# Patient Record
Sex: Female | Born: 1975 | Race: White | Hispanic: No | Marital: Married | Smoking: Former smoker
Health system: Southern US, Community
[De-identification: ages and names within clinical notes are randomized; demographics above are authoritative.]

## PROBLEM LIST (undated history)

## (undated) DIAGNOSIS — J45909 Unspecified asthma, uncomplicated: Secondary | ICD-10-CM

## (undated) DIAGNOSIS — I82409 Acute embolism and thrombosis of unspecified deep veins of unspecified lower extremity: Secondary | ICD-10-CM

---

## 1998-10-06 ENCOUNTER — Ambulatory Visit (HOSPITAL_COMMUNITY): Admission: RE | Admit: 1998-10-06 | Discharge: 1998-10-06 | Payer: Self-pay | Admitting: *Deleted

## 2000-01-22 ENCOUNTER — Emergency Department (HOSPITAL_COMMUNITY): Admission: EM | Admit: 2000-01-22 | Discharge: 2000-01-22 | Payer: Self-pay | Admitting: Emergency Medicine

## 2000-01-24 ENCOUNTER — Emergency Department (HOSPITAL_COMMUNITY): Admission: EM | Admit: 2000-01-24 | Discharge: 2000-01-24 | Payer: Self-pay | Admitting: Emergency Medicine

## 2001-09-21 ENCOUNTER — Emergency Department (HOSPITAL_COMMUNITY): Admission: EM | Admit: 2001-09-21 | Discharge: 2001-09-21 | Payer: Self-pay | Admitting: Emergency Medicine

## 2002-04-19 ENCOUNTER — Encounter: Admission: RE | Admit: 2002-04-19 | Discharge: 2002-04-19 | Payer: Self-pay | Admitting: Family Medicine

## 2002-04-26 ENCOUNTER — Encounter: Admission: RE | Admit: 2002-04-26 | Discharge: 2002-04-26 | Payer: Self-pay | Admitting: Family Medicine

## 2002-05-02 ENCOUNTER — Ambulatory Visit (HOSPITAL_COMMUNITY): Admission: RE | Admit: 2002-05-02 | Discharge: 2002-05-02 | Payer: Self-pay | Admitting: Family Medicine

## 2002-05-05 ENCOUNTER — Inpatient Hospital Stay (HOSPITAL_COMMUNITY): Admission: AD | Admit: 2002-05-05 | Discharge: 2002-05-05 | Payer: Self-pay | Admitting: *Deleted

## 2002-05-05 ENCOUNTER — Encounter (INDEPENDENT_AMBULATORY_CARE_PROVIDER_SITE_OTHER): Payer: Self-pay | Admitting: *Deleted

## 2002-05-10 ENCOUNTER — Ambulatory Visit: Admission: RE | Admit: 2002-05-10 | Discharge: 2002-05-10 | Payer: Self-pay | Admitting: Family Medicine

## 2002-05-18 ENCOUNTER — Encounter: Admission: RE | Admit: 2002-05-18 | Discharge: 2002-05-18 | Payer: Self-pay | Admitting: Family Medicine

## 2002-05-28 ENCOUNTER — Encounter: Admission: RE | Admit: 2002-05-28 | Discharge: 2002-05-28 | Payer: Self-pay | Admitting: Family Medicine

## 2002-06-28 ENCOUNTER — Encounter: Admission: RE | Admit: 2002-06-28 | Discharge: 2002-06-28 | Payer: Self-pay | Admitting: Family Medicine

## 2002-07-12 ENCOUNTER — Ambulatory Visit (HOSPITAL_COMMUNITY): Admission: RE | Admit: 2002-07-12 | Discharge: 2002-07-12 | Payer: Self-pay | Admitting: Family Medicine

## 2002-08-03 ENCOUNTER — Encounter: Admission: RE | Admit: 2002-08-03 | Discharge: 2002-08-03 | Payer: Self-pay | Admitting: Family Medicine

## 2002-09-07 ENCOUNTER — Encounter: Admission: RE | Admit: 2002-09-07 | Discharge: 2002-09-07 | Payer: Self-pay | Admitting: Family Medicine

## 2002-09-28 ENCOUNTER — Encounter: Admission: RE | Admit: 2002-09-28 | Discharge: 2002-09-28 | Payer: Self-pay | Admitting: Family Medicine

## 2002-10-11 ENCOUNTER — Encounter: Admission: RE | Admit: 2002-10-11 | Discharge: 2002-10-11 | Payer: Self-pay | Admitting: *Deleted

## 2002-10-24 ENCOUNTER — Encounter: Admission: RE | Admit: 2002-10-24 | Discharge: 2002-10-24 | Payer: Self-pay | Admitting: Family Medicine

## 2002-11-09 ENCOUNTER — Encounter: Admission: RE | Admit: 2002-11-09 | Discharge: 2002-11-09 | Payer: Self-pay | Admitting: Family Medicine

## 2002-11-13 ENCOUNTER — Encounter: Admission: RE | Admit: 2002-11-13 | Discharge: 2002-11-13 | Payer: Self-pay | Admitting: Sports Medicine

## 2002-11-20 ENCOUNTER — Encounter: Admission: RE | Admit: 2002-11-20 | Discharge: 2002-11-20 | Payer: Self-pay | Admitting: Family Medicine

## 2002-11-27 ENCOUNTER — Inpatient Hospital Stay (HOSPITAL_COMMUNITY): Admission: AD | Admit: 2002-11-27 | Discharge: 2002-11-27 | Payer: Self-pay | Admitting: Obstetrics and Gynecology

## 2002-11-28 ENCOUNTER — Encounter: Admission: RE | Admit: 2002-11-28 | Discharge: 2002-11-28 | Payer: Self-pay | Admitting: Family Medicine

## 2002-12-04 ENCOUNTER — Encounter: Admission: RE | Admit: 2002-12-04 | Discharge: 2002-12-04 | Payer: Self-pay | Admitting: Family Medicine

## 2002-12-09 ENCOUNTER — Inpatient Hospital Stay (HOSPITAL_COMMUNITY): Admission: AD | Admit: 2002-12-09 | Discharge: 2002-12-11 | Payer: Self-pay | Admitting: *Deleted

## 2003-01-31 ENCOUNTER — Encounter: Admission: RE | Admit: 2003-01-31 | Discharge: 2003-01-31 | Payer: Self-pay | Admitting: Family Medicine

## 2003-03-06 ENCOUNTER — Encounter: Admission: RE | Admit: 2003-03-06 | Discharge: 2003-03-06 | Payer: Self-pay | Admitting: Family Medicine

## 2003-05-28 ENCOUNTER — Encounter: Admission: RE | Admit: 2003-05-28 | Discharge: 2003-05-28 | Payer: Self-pay | Admitting: Sports Medicine

## 2003-08-22 ENCOUNTER — Encounter: Admission: RE | Admit: 2003-08-22 | Discharge: 2003-08-22 | Payer: Self-pay | Admitting: Family Medicine

## 2003-10-03 ENCOUNTER — Emergency Department (HOSPITAL_COMMUNITY): Admission: EM | Admit: 2003-10-03 | Discharge: 2003-10-04 | Payer: Self-pay | Admitting: Emergency Medicine

## 2003-11-21 ENCOUNTER — Encounter: Admission: RE | Admit: 2003-11-21 | Discharge: 2003-11-21 | Payer: Self-pay | Admitting: Sports Medicine

## 2004-02-20 ENCOUNTER — Encounter: Admission: RE | Admit: 2004-02-20 | Discharge: 2004-02-20 | Payer: Self-pay | Admitting: Family Medicine

## 2004-05-21 ENCOUNTER — Ambulatory Visit: Payer: Self-pay | Admitting: Family Medicine

## 2004-08-13 ENCOUNTER — Ambulatory Visit: Payer: Self-pay | Admitting: Family Medicine

## 2004-11-12 ENCOUNTER — Ambulatory Visit: Payer: Self-pay | Admitting: Family Medicine

## 2005-02-09 ENCOUNTER — Ambulatory Visit: Payer: Self-pay | Admitting: Family Medicine

## 2005-02-18 ENCOUNTER — Ambulatory Visit: Payer: Self-pay | Admitting: Sports Medicine

## 2005-02-18 ENCOUNTER — Encounter: Admission: RE | Admit: 2005-02-18 | Discharge: 2005-02-18 | Payer: Self-pay | Admitting: Sports Medicine

## 2005-05-06 ENCOUNTER — Ambulatory Visit: Payer: Self-pay | Admitting: Family Medicine

## 2005-07-28 ENCOUNTER — Ambulatory Visit: Payer: Self-pay | Admitting: Family Medicine

## 2005-10-27 ENCOUNTER — Ambulatory Visit: Payer: Self-pay | Admitting: Family Medicine

## 2006-01-25 ENCOUNTER — Ambulatory Visit: Payer: Self-pay | Admitting: Family Medicine

## 2006-01-28 ENCOUNTER — Ambulatory Visit: Payer: Self-pay | Admitting: Family Medicine

## 2006-02-24 ENCOUNTER — Ambulatory Visit: Payer: Self-pay | Admitting: Family Medicine

## 2006-05-23 ENCOUNTER — Encounter (INDEPENDENT_AMBULATORY_CARE_PROVIDER_SITE_OTHER): Payer: Self-pay | Admitting: *Deleted

## 2006-06-06 ENCOUNTER — Ambulatory Visit: Payer: Self-pay | Admitting: Family Medicine

## 2006-06-22 ENCOUNTER — Ambulatory Visit: Payer: Self-pay | Admitting: Family Medicine

## 2006-07-06 ENCOUNTER — Emergency Department (HOSPITAL_COMMUNITY): Admission: EM | Admit: 2006-07-06 | Discharge: 2006-07-07 | Payer: Self-pay | Admitting: Emergency Medicine

## 2006-08-23 HISTORY — PX: CHOLECYSTECTOMY: SHX55

## 2006-08-29 ENCOUNTER — Emergency Department (HOSPITAL_COMMUNITY): Admission: EM | Admit: 2006-08-29 | Discharge: 2006-08-29 | Payer: Self-pay | Admitting: Emergency Medicine

## 2006-09-03 ENCOUNTER — Ambulatory Visit (HOSPITAL_COMMUNITY): Admission: EM | Admit: 2006-09-03 | Discharge: 2006-09-03 | Payer: Self-pay | Admitting: Emergency Medicine

## 2006-09-03 ENCOUNTER — Encounter (INDEPENDENT_AMBULATORY_CARE_PROVIDER_SITE_OTHER): Payer: Self-pay | Admitting: Specialist

## 2006-10-20 DIAGNOSIS — F17200 Nicotine dependence, unspecified, uncomplicated: Secondary | ICD-10-CM

## 2006-10-20 DIAGNOSIS — M79609 Pain in unspecified limb: Secondary | ICD-10-CM

## 2006-10-20 DIAGNOSIS — K449 Diaphragmatic hernia without obstruction or gangrene: Secondary | ICD-10-CM | POA: Insufficient documentation

## 2006-10-20 DIAGNOSIS — G44209 Tension-type headache, unspecified, not intractable: Secondary | ICD-10-CM

## 2006-10-20 DIAGNOSIS — Z87891 Personal history of nicotine dependence: Secondary | ICD-10-CM | POA: Insufficient documentation

## 2006-10-20 HISTORY — DX: Nicotine dependence, unspecified, uncomplicated: F17.200

## 2006-10-20 HISTORY — DX: Diaphragmatic hernia without obstruction or gangrene: K44.9

## 2006-10-21 ENCOUNTER — Encounter (INDEPENDENT_AMBULATORY_CARE_PROVIDER_SITE_OTHER): Payer: Self-pay | Admitting: *Deleted

## 2007-07-10 ENCOUNTER — Ambulatory Visit: Payer: Self-pay | Admitting: Family Medicine

## 2007-07-10 DIAGNOSIS — R0602 Shortness of breath: Secondary | ICD-10-CM | POA: Insufficient documentation

## 2007-07-11 ENCOUNTER — Telehealth (INDEPENDENT_AMBULATORY_CARE_PROVIDER_SITE_OTHER): Payer: Self-pay | Admitting: Family Medicine

## 2007-07-11 ENCOUNTER — Encounter: Admission: RE | Admit: 2007-07-11 | Discharge: 2007-07-11 | Payer: Self-pay | Admitting: Vascular Surgery

## 2007-09-22 ENCOUNTER — Telehealth: Payer: Self-pay | Admitting: *Deleted

## 2007-09-22 ENCOUNTER — Ambulatory Visit: Payer: Self-pay | Admitting: Family Medicine

## 2007-09-25 ENCOUNTER — Telehealth: Payer: Self-pay | Admitting: *Deleted

## 2008-01-11 ENCOUNTER — Telehealth (INDEPENDENT_AMBULATORY_CARE_PROVIDER_SITE_OTHER): Payer: Self-pay | Admitting: *Deleted

## 2008-01-12 ENCOUNTER — Encounter (INDEPENDENT_AMBULATORY_CARE_PROVIDER_SITE_OTHER): Payer: Self-pay | Admitting: Family Medicine

## 2008-01-12 ENCOUNTER — Ambulatory Visit: Payer: Self-pay | Admitting: Family Medicine

## 2008-01-12 DIAGNOSIS — R110 Nausea: Secondary | ICD-10-CM

## 2008-01-12 DIAGNOSIS — K648 Other hemorrhoids: Secondary | ICD-10-CM

## 2008-01-12 LAB — CONVERTED CEMR LAB
ALT: 14 units/L (ref 0–35)
AST: 12 units/L (ref 0–37)
Albumin: 4.3 g/dL (ref 3.5–5.2)
Alkaline Phosphatase: 80 units/L (ref 39–117)
BUN: 12 mg/dL (ref 6–23)
Beta hcg, urine, semiquantitative: NEGATIVE
CO2: 23 meq/L (ref 19–32)
Creatinine, Ser: 0.81 mg/dL (ref 0.40–1.20)
Glucose, Bld: 83 mg/dL (ref 70–99)
HDL: 51 mg/dL (ref >39)
Potassium: 4.4 meq/L (ref 3.5–5.3)
Total Bilirubin: 0.5 mg/dL (ref 0.3–1.2)
Total CHOL/HDL Ratio: 3.4
Total Protein: 6.9 g/dL (ref 6.0–8.3)

## 2008-01-16 ENCOUNTER — Encounter (INDEPENDENT_AMBULATORY_CARE_PROVIDER_SITE_OTHER): Payer: Self-pay | Admitting: Family Medicine

## 2008-06-05 ENCOUNTER — Telehealth (INDEPENDENT_AMBULATORY_CARE_PROVIDER_SITE_OTHER): Payer: Self-pay | Admitting: *Deleted

## 2008-06-06 ENCOUNTER — Ambulatory Visit: Payer: Self-pay | Admitting: Family Medicine

## 2008-06-06 DIAGNOSIS — R059 Cough, unspecified: Secondary | ICD-10-CM | POA: Insufficient documentation

## 2008-06-06 DIAGNOSIS — R05 Cough: Secondary | ICD-10-CM

## 2008-09-06 ENCOUNTER — Ambulatory Visit: Payer: Self-pay | Admitting: Family Medicine

## 2008-09-06 ENCOUNTER — Encounter: Payer: Self-pay | Admitting: Family Medicine

## 2008-09-06 ENCOUNTER — Ambulatory Visit (HOSPITAL_COMMUNITY): Admission: RE | Admit: 2008-09-06 | Discharge: 2008-09-06 | Payer: Self-pay | Admitting: Family Medicine

## 2008-09-06 DIAGNOSIS — R079 Chest pain, unspecified: Secondary | ICD-10-CM | POA: Insufficient documentation

## 2008-09-06 HISTORY — DX: Chest pain, unspecified: R07.9

## 2008-09-06 LAB — CONVERTED CEMR LAB

## 2008-09-11 ENCOUNTER — Encounter: Payer: Self-pay | Admitting: Family Medicine

## 2009-04-21 ENCOUNTER — Telehealth: Payer: Self-pay | Admitting: *Deleted

## 2009-04-27 ENCOUNTER — Emergency Department (HOSPITAL_COMMUNITY): Admission: EM | Admit: 2009-04-27 | Discharge: 2009-04-28 | Payer: Self-pay | Admitting: Emergency Medicine

## 2009-06-24 ENCOUNTER — Encounter: Payer: Self-pay | Admitting: Family Medicine

## 2009-06-24 ENCOUNTER — Ambulatory Visit: Payer: Self-pay | Admitting: Family Medicine

## 2009-06-24 ENCOUNTER — Ambulatory Visit (HOSPITAL_COMMUNITY): Admission: RE | Admit: 2009-06-24 | Discharge: 2009-06-24 | Payer: Self-pay | Admitting: Family Medicine

## 2009-06-24 ENCOUNTER — Encounter: Admission: RE | Admit: 2009-06-24 | Discharge: 2009-06-24 | Payer: Self-pay | Admitting: Family Medicine

## 2009-07-03 ENCOUNTER — Encounter: Payer: Self-pay | Admitting: Family Medicine

## 2009-10-17 ENCOUNTER — Encounter: Payer: Self-pay | Admitting: Sports Medicine

## 2009-10-17 ENCOUNTER — Ambulatory Visit: Payer: Self-pay | Admitting: Family Medicine

## 2009-10-17 DIAGNOSIS — H9190 Unspecified hearing loss, unspecified ear: Secondary | ICD-10-CM

## 2010-09-24 NOTE — Assessment & Plan Note (Signed)
Summary: cough & earache,tcb   Vital Signs:  Patient profile:   35 year old female Weight:      206.1 pounds Temp:     98.8 degrees F oral Pulse rate:   97 / minute Pulse rhythm:   regular BP sitting:   123 / 79  (right arm) Cuff size:   regular  Vitals Entered By: Loralee Pacas CMA (October 17, 2009 3:43 PM) Comments cough x 1 month,earache x 2 weeks right ear is worse constant headache    Primary Care Provider:  Myrtie Soman  MD   History of Present Illness: URI Symptoms Onset: 1 month Description: cough, subjective fevers, fatigue.  Symptoms Nasal discharge: yes Fever: subjective Sore throat: no Cough: yes Wheezing: no Ear pain: yes, right with discharge GI symptoms: no Sick contacts: yes at home  Red Flags  Stiff neck: no Dyspnea: no Rash: no Swallowing difficulty: no  Sinusitis Risk Factors Headache/face pain: yes Double sickening: no tooth pain: no  Allergy Risk Factors Sneezing: n Itchy scratchy throat: n Seasonal symptoms: n  Flu Risk Factors Headache: y muscle aches: n severe fatigue: n    Current Medications (verified): 1)  Ventolin Hfa 108 (90 Base) Mcg/act Aers (Albuterol Sulfate) .Marland Kitchen.. 1-2 Puffs Inhaled Every 4 Hours As Needed For Cough or Wheeze 2)  Augmentin 500-125 Mg Tabs (Amoxicillin-Pot Clavulanate) .... One Tab By Mouth Two Times A Day X 10 Days 3)  Delsym Night Time Multi-Sympt 5-6.25-10-325 Mg/7ml Liqd (Phenyleph-Doxylamine-Dm-Apap) .Marland Kitchen.. 10ml By Mouth Two Times A Day, May Cause Drowsiness, Best To Take At Night.  Allergies (verified): No Known Drug Allergies  Review of Systems       See HPI  Physical Exam  General:  Well-developed,well-nourished,in no acute distress; alert,appropriate and cooperative throughout examination Head:  Normocephalic and atraumatic without obvious abnormalities.  Eyes:  No corneal or conjunctival inflammation noted. EOMI. Perrla. Ears:  External ear exam shows no significant lesions or  deformities.  Otoscopic examination reveals R EAM with effusion behind TM, some erythema, pain.  L EAM normal. No mastoid tenderness. Nose:  Frontal sinuses tender. Mouth:  Oral mucosa and oropharynx without lesions or exudates.  Neck:  Shotty LAD in neck. Lungs:  Normal respiratory effort, chest expands symmetrically. Lungs are clear to auscultation, no crackles or wheezes. Heart:  Normal rate and regular rhythm. S1 and S2 normal without gallop, murmur, click, rub or other extra sounds. Abdomen:  Bowel sounds positive,abdomen soft and non-tender without masses, organomegaly or hernias noted. Additional Exam:  Weber lateralizes to left, AC>BC in both ears.  R ear with effusion.   Impression & Recommendations:  Problem # 1:  SINUSITIS (ICD-473.9) Assessment New Sinusitis, likely some otitis media with effusion present as well.  Tx with augmentin, delsym night for symptomatic relief.  Tuning fork testing suggestive of Sensorineural loss on right, same side as effusion, will refer to ENT.  Her updated medication list for this problem includes:    Augmentin 500-125 Mg Tabs (Amoxicillin-pot clavulanate) ..... One tab by mouth two times a day x 10 days    Delsym Night Time Multi-sympt 5-6.25-10-325 Mg/32ml Liqd (Phenyleph-doxylamine-dm-apap) .Marland KitchenMarland KitchenMarland KitchenMarland Kitchen 10ml by mouth two times a day, may cause drowsiness, best to take at night.  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  Orders: ENT Referral (ENT)  Complete Medication  List: 1)  Ventolin Hfa 108 (90 Base) Mcg/act Aers (Albuterol sulfate) .Marland Kitchen.. 1-2 puffs inhaled every 4 hours as needed for cough or wheeze 2)  Augmentin 500-125 Mg Tabs (Amoxicillin-pot clavulanate) .... One tab by mouth two times a day x 10 days 3)  Delsym Night Time Multi-sympt 5-6.25-10-325 Mg/40ml Liqd  (Phenyleph-doxylamine-dm-apap) .Marland Kitchen.. 10ml by mouth two times a day, may cause drowsiness, best to take at night.  Patient Instructions: 1)  Sinusitis, R ear effusion. 2)  Augmentin 3)  Delsym nighttime (delsym, phenylephrine) 4)  Come back if no better in 10 days. 5)  If hearing is no better in 12 weeks, come back! 6)  -Dr. Karie Schwalbe. Prescriptions: DELSYM NIGHT TIME MULTI-SYMPT 5-6.25-10-325 MG/15ML LIQD (PHENYLEPH-DOXYLAMINE-DM-APAP) 10mL by mouth two times a day, may cause drowsiness, best to take at night.  #1 bottle x 0   Entered and Authorized by:   Rodney Langton MD   Signed by:   Rodney Langton MD on 10/17/2009   Method used:   Electronically to        [facility] (retail)       [address]       [place], [place]  [address]       Ph: [phone] or [phone]       Fax: [phone]   RxID:   [id number] AUGMENTIN 500-125 MG TABS (AMOXICILLIN-POT CLAVULANATE) One tab by mouth two times a day x 10 days  #20 x 0   Entered and Authorized by:   Rodney Langton MD   Signed by:   Rodney Langton MD on 10/17/2009   Method used:   Electronically to        [facility] (retail)       [address]       [place], [place]  [address]       Ph: [phone] or [phone]       Fax: [phone]   RxID:   [id number]

## 2010-09-24 NOTE — Letter (Signed)
Summary: *Referral Letter ENT  [facility]  [address]   [place], [place] [address]   Phone: [phone]  Fax: [phone]    10/17/2009  Thank you in advance for agreeing to see my patient:  Monica Sandoval [address] [place], [place]  [address]  Phone: [phone]  Reason for Referral: 35 year old female with recent right otitis media with persistent effusion and hearing loss on right side.  Effusion with bubbles seen on otoscopy on right, left side normal.  Weber Test lateralizes to left, Rinne Test: air conduction is greater than bone conduction in both ears.  Please help in evaluation for sensorineural hearing loss in right ear.  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   Past Medical History: 1)  IUD placed 06/22/06, NSVD `95, 2004, Wheeze, NOS-->Assthma?   Thank you again for agreeing to see our patient; please contact us if you have any further questions or need additional information.  Sincerely,  Rodney Langton MD

## 2010-09-24 NOTE — Letter (Signed)
Summary: Handout Printed  Printed Handout:  - Sinusitis 

## 2010-11-27 LAB — URINALYSIS, ROUTINE W REFLEX MICROSCOPIC
Bilirubin Urine: NEGATIVE
Hgb urine dipstick: NEGATIVE
Ketones, ur: NEGATIVE mg/dL
Nitrite: NEGATIVE
Protein, ur: NEGATIVE mg/dL
Specific Gravity, Urine: 1.024 (ref 1.005–1.030)
Urobilinogen, UA: 1 mg/dL (ref 0.0–1.0)

## 2010-11-27 LAB — COMPREHENSIVE METABOLIC PANEL
BUN: 9 mg/dL (ref 6–23)
CO2: 20 mEq/L (ref 19–32)
Calcium: 8.7 mg/dL (ref 8.4–10.5)
Chloride: 110 mEq/L (ref 96–112)
Creatinine, Ser: 0.78 mg/dL (ref 0.4–1.2)
GFR calc Af Amer: >60 mL/min (ref >60)
GFR calc non Af Amer: >60 mL/min (ref >60)
Glucose, Bld: 104 mg/dL — ABNORMAL HIGH (ref 70–99)
Sodium: 138 mEq/L (ref 135–145)
Total Bilirubin: 0.3 mg/dL (ref 0.3–1.2)

## 2010-11-27 LAB — DIFFERENTIAL
Basophils Absolute: 0 10*3/uL (ref 0.0–0.1)
Eosinophils Absolute: 0.5 10*3/uL (ref 0.0–0.7)
Eosinophils Relative: 5 % (ref 0–5)
Lymphocytes Relative: 27 % (ref 12–46)
Lymphs Abs: 2.7 10*3/uL (ref 0.7–4.0)

## 2010-11-27 LAB — CBC: MCHC: 34.5 g/dL (ref 30.0–36.0)

## 2010-11-27 LAB — POCT CARDIAC MARKERS
CKMB, poc: <1 ng/mL — ABNORMAL LOW (ref 1.0–8.0)
Myoglobin, poc: 48 ng/mL (ref 12–200)

## 2010-11-27 LAB — LIPASE, BLOOD: Lipase: 38 U/L (ref 11–59)

## 2010-11-27 LAB — D-DIMER, QUANTITATIVE: D-Dimer, Quant: 0.35 ug/mL-FEU (ref 0.00–0.48)

## 2011-01-08 NOTE — Op Note (Signed)
NAMEPHYILLIS, Monica Sandoval NO.:  [id number]   MEDICAL RECORD NO.:  [id number]          PATIENT TYPE:  OBV   LOCATION:  0098                         FACILITY:  [facility]   PHYSICIAN:  Anselm Pancoast. Weatherly, M.D.DATE OF BIRTH:  24-Dec-1975   DATE OF PROCEDURE:  09/03/2006  DATE OF DISCHARGE:  09/03/2006                               OPERATIVE REPORT   PREOPERATIVE DIAGNOSIS:  Chronic cholecystitis with stones, possible  common duct stone.   POSTOPERATIVE DIAGNOSIS:  Chronic cholecystitis with stones.   OPERATION:  Laparoscopic cholecystectomy with cholangiogram.   ANESTHESIA:  General anesthesia   SURGEON:  Anselm Pancoast. Zachery Dakins, M.D.   ASSISTANT:  Angelia Mould. Derrell Lolling, M.D.   HISTORY:  Daphne Karrer is a 35 year old Caucasian female, mother of 2.  They were referred to our office by the family practice with the  following history:  She has had several episodes of epigastric pain,  right upper quadrant radiating to the back; first started in November,  and then several attacks over the Christmas holidays.  She was seen and  ordered an ultrasound, read by Dr. Pecolia Ades, that showed stones, but no  evidence of acute cholecystitis and not a dilated common bile duct.  However, over the last week or two she has had repeat episodes; and then  it will go to her chest and to the back.  I saw her in the office on  Wednesday night with these symptoms.  I suggested that we kind of add  her to the urgent schedule, since it appeared to be that she is probably  passing common duct stones; and she has had some bumps in her liver  tests intermittently.  I was able to get her on the OR schedule for  Saturday morning, 2 days after I saw her in the office.  Last night she  had another episode of pain; and came to the emergency room at  approximately midnight; was given pain medicine, and spent the night in  the emergency room; since she was supposed to be here for surgery about  6:00 a.m.Marland Kitchen   She said her pain had resolved preoperatively and liver  function studies repeated last night were mildly elevated on CPT, but  normal bilirubin.   The patient preop was given 3 grams of Unasyn.  She has PAS stockings;  and was taken to the operative suite.  After induction of general  anesthesia, endotracheal tube/oral tube into the stomach, the abdomen  was prepped with Betadine solution, and draped sterilely.  A small  incision was made below the umbilicus.  The fascia was identified.  She  is moderately overweight, picked up between two Kochers; and then a  little opening carefully made through the fascia of the underlying  peritoneum was picked up; and then a little opening made through this.  The Hassan cannula was introduced and the gallbladder was not acutely  distended; and there were a few adhesions up around it,  but not that of  an obvious acute problem.  The upper 10-mm trocar was placed in the  subxiphoid area; and the two lateral 5-mm  trocars were placed in the  appropriate position by Dr. Derrell Lolling.   The gallbladder was retracted upward and outward; we could see what  appeared to be the junction of the cystic duct of the gallbladder within  the fatty tissue; and this was kind of carefully opened.  The cystic  artery was separated from the neck of the gallbladder and doubly clipped  proximally, and singly distally just saw the little lymph node was here  and identified.  Then we could encompass the cystic duct.  You could see  a stone in the cystic duct that was just out of the gallbladder, and I  tried to push it back up into the gallbladder and placed a clip; but  when we did, the stone was visible and I must have kind of pinched the  mucosa of the gallbladder and then firing the clip made the stone sort  of tear the wall of the gallbladder.  We removed that, made a little  incision just proximally in the cystic duct; and could cannulate it with  the hook catheter; and  then an x-ray was obtained.  She has a junction  of the cystic duct to really the right hepatic duct, but there is about  1.5 cm length, and then good flow through the common bile duct; the left  and right intrahepatic branches visualize, and then good flow into the  duodenum.  We removed the catheter and then triply clipped this little  short cystic duct, making sure that we were not impinging anything as  far as the right hepatic duct, etcetera.  We then kind of carefully  dissected the peritoneum; and, I think, there was a little posterior  branch of the cystic artery that was clipped; and then the gallbladder  freed from its bed with hook electrocautery.  We placed the gallbladder  in the EndoCatch bag; and then reinspected the gallbladder fossa where  the clips had been placed; and there was no bile and no bleeding; and we  were satisfied.   We then switched the camera to the upper 10-mm port and withdrew the bag  containing the gallbladder.  On reinspection, there was no evidence any  bleeding or bile or remaining irrigating fluid; and then the 5 mm port  was withdrawn.  I closed the fascia at the umbilicus with the  pursestring that we had originally placed; and then a couple of figure-  of-eight sutures of #0 Vicryl with a U-5 needle; and then released the  carbon dioxide from the upper 10-mm port.  We had inspected the  umbilical repair with the camera prior to doing this; and then also put  a figure-of-eight in the anterior fascia at the subxiphoid area with the  U-6 needle.  The subcutaneous wounds were closed with 4-0 Vicryl,  Benzoin, and Steri-Strips;  and then she was extubated and sent to the  recovery room in stable postop condition.  The patient tolerated the  procedure nicely.  Hopefully she will be able to go home today if she is  not nauseated.           ______________________________  Anselm Pancoast. Zachery Dakins, M.D.    WJW/MEDQ  D:  09/03/2006  T:  09/03/2006   Job:  [id number]

## 2011-07-02 ENCOUNTER — Other Ambulatory Visit: Payer: Self-pay

## 2011-07-02 ENCOUNTER — Encounter: Payer: Self-pay | Admitting: *Deleted

## 2011-07-02 ENCOUNTER — Emergency Department (HOSPITAL_COMMUNITY)
Admission: EM | Admit: 2011-07-02 | Discharge: 2011-07-02 | Disposition: A | Discharge status: Home / Self Care | Payer: Self-pay | Attending: Emergency Medicine | Admitting: Emergency Medicine

## 2011-07-02 ENCOUNTER — Emergency Department (HOSPITAL_COMMUNITY): Payer: Self-pay

## 2011-07-02 DIAGNOSIS — R079 Chest pain, unspecified: Secondary | ICD-10-CM | POA: Insufficient documentation

## 2011-07-02 DIAGNOSIS — R05 Cough: Secondary | ICD-10-CM | POA: Insufficient documentation

## 2011-07-02 DIAGNOSIS — R209 Unspecified disturbances of skin sensation: Secondary | ICD-10-CM | POA: Insufficient documentation

## 2011-07-02 DIAGNOSIS — R609 Edema, unspecified: Secondary | ICD-10-CM | POA: Insufficient documentation

## 2011-07-02 DIAGNOSIS — R110 Nausea: Secondary | ICD-10-CM | POA: Insufficient documentation

## 2011-07-02 DIAGNOSIS — J40 Bronchitis, not specified as acute or chronic: Secondary | ICD-10-CM | POA: Insufficient documentation

## 2011-07-02 DIAGNOSIS — J209 Acute bronchitis, unspecified: Secondary | ICD-10-CM

## 2011-07-02 DIAGNOSIS — R51 Headache: Secondary | ICD-10-CM | POA: Insufficient documentation

## 2011-07-02 DIAGNOSIS — R059 Cough, unspecified: Secondary | ICD-10-CM | POA: Insufficient documentation

## 2011-07-02 LAB — CBC
MCH: 30.5 pg (ref 26.0–34.0)
MCHC: 34.8 g/dL (ref 30.0–36.0)
RDW: 13.8 % (ref 11.5–15.5)

## 2011-07-02 LAB — BASIC METABOLIC PANEL
Calcium: 9.5 mg/dL (ref 8.4–10.5)
GFR calc Af Amer: >90 mL/min (ref >90)
GFR calc non Af Amer: >90 mL/min (ref >90)
Potassium: 4 mEq/L (ref 3.5–5.1)
Sodium: 140 mEq/L (ref 135–145)

## 2011-07-02 LAB — POCT I-STAT TROPONIN I: Troponin i, poc: 0 ng/mL (ref 0.00–0.08)

## 2011-07-02 MED ORDER — PREDNISONE 20 MG PO TABS
60.0000 mg | ORAL_TABLET | Freq: Once | ORAL | Status: AC
  Administered 2011-07-02: 60 mg via ORAL
  Filled 2011-07-02: qty 3

## 2011-07-02 MED ORDER — IPRATROPIUM BROMIDE 0.02 % IN SOLN
0.5000 mg | Freq: Once | RESPIRATORY_TRACT | Status: AC
  Administered 2011-07-02: 0.5 mg via RESPIRATORY_TRACT
  Filled 2011-07-02: qty 2.5

## 2011-07-02 MED ORDER — PREDNISONE 20 MG PO TABS: 60.0000 mg | ORAL_TABLET | Freq: Every day | ORAL | Status: AC

## 2011-07-02 MED ORDER — ALBUTEROL SULFATE HFA 108 (90 BASE) MCG/ACT IN AERS
2.0000 | INHALATION_SPRAY | RESPIRATORY_TRACT | Status: DC | PRN
  Administered 2011-07-02: 2 via RESPIRATORY_TRACT
  Filled 2011-07-02: qty 6.7

## 2011-07-02 MED ORDER — ASPIRIN 81 MG PO CHEW
324.0000 mg | CHEWABLE_TABLET | Freq: Once | ORAL | Status: AC
  Administered 2011-07-02: 324 mg via ORAL
  Filled 2011-07-02: qty 4

## 2011-07-02 MED ORDER — ALBUTEROL SULFATE (5 MG/ML) 0.5% IN NEBU
2.5000 mg | INHALATION_SOLUTION | Freq: Once | RESPIRATORY_TRACT | Status: AC
  Administered 2011-07-02: 2.5 mg via RESPIRATORY_TRACT
  Filled 2011-07-02: qty 1

## 2011-07-02 MED ORDER — ALBUTEROL SULFATE HFA 108 (90 BASE) MCG/ACT IN AERS: 2.0000 | INHALATION_SPRAY | RESPIRATORY_TRACT | Status: DC | PRN

## 2011-07-02 NOTE — ED Notes (Signed)
Pt resting at this time.  HHN given, saline lock started.  Husband at bedside.  Skin warm and dry color appropriate.

## 2011-07-02 NOTE — ED Notes (Signed)
Pt reports left sided chest pain x several days. Reports last night pain started radiating into left arm with tingling. Reports nausea while driving to ED. Pt tearful during exam. Reports it feels like left arm is asleep.

## 2011-07-02 NOTE — ED Provider Notes (Signed)
History     CSN: [id number] Arrival date & time: 07/02/2011 11:01 AM   First MD Initiated Contact with Patient 07/02/11 1503      Chief Complaint  Patient presents with  . Chest Pain    (Consider location/radiation/quality/duration/timing/severity/associated sxs/prior treatment) The history is provided by the patient.   35 year old female comes in with a one-week history of chest pain. Pain is described as a dole sensation which becomes sharp when she takes a deep breath. Yesterday she started having some numbness in her left arm. She's had a headache during this past week as well. There's been some mild nausea, but no dyspnea and no diaphoresis. She has had a chronic cough for approximately 7 years which has gotten worse recently. She states that her pain as moderate with current pain being 5/10 and worst pain being 7/10. Nothing seems to make the pain better nothing seems to make it worse. It does not seem to be affected by taking deep breaths lying down sitting up or exertion. She is taking ibuprofen for the pain with no relief. She has a history of a hiatus hernia which is what she thought the pain was due to him. She got worried when she started having the numbness in her left arm. Cardiac risk factors are significant for tobacco abuse. She smokes half pack of cigarettes a day. There is a history of premature coronary disease in her family in that her father died at age 8 of an MI. She denies history of hypertension diabetes or high cholesterol.  History reviewed. No pertinent past medical history.  History reviewed. No pertinent past surgical history.  History reviewed. No pertinent family history.  History  Substance Use Topics  . Smoking status: Current Everyday Smoker    Types: Cigarettes  . Smokeless tobacco: Not on file  . Alcohol Use: No    OB History    Grav Para Term Preterm Abortions TAB SAB Ect Mult Living                  Review of Systems  All other systems  reviewed and are negative.    Allergies  Review of patient's allergies indicates no known allergies.  Home Medications   Current Outpatient Rx  Name Route Sig Dispense Refill  . IBUPROFEN 200 MG PO TABS Oral Take 800 mg by mouth every 8 (eight) hours as needed.       BP 122/68  Pulse 83  Temp(Src) 97.8 F (36.6 C) (Oral)  Resp 20  SpO2 98%  Physical Exam  Nursing note and vitals reviewed.  vital signs are normal. 35 year old female resting comfortably and in no acute distress. Head is normocephalic and atraumatic. HEENT: Pupils equal neck no jugular movements are full. Oropharynx is clear. TMs are clear. No sinus tenderness and no evidence of sinus drainage. Neck is supple without adenopathy or JVD. Lungs have diffuse mild wheezing and rhonchi but no rales present. Heart regular rate and rhythm without murmur or gallop. Chest: There is moderate bilateral parasternal tenderness which does not reproduce her pain. Abdomen is soft flat nontender without masses or hepatosplenomegaly peristalsis is normoactive. Extremities there is 1+ edema, but no cyanosis and no clubbing. Full range of motion is present in all joints. Neurologic:. She is awake alert oriented x3.  Cranial nerves are intact. There no focal motor Center deficits. Deep tendon reflexes are symmetric. Psychiatric: No abnormalities of mood or affect. ED Course  Procedures (including critical care time) She was given an  albuterol nebulizer treatment with significant improvement in chest discomfort with breathing. Reexamination showed lungs had only a few residual rhonchi. All wheezing was now gone.  Labs Reviewed  CBC  BASIC METABOLIC PANEL  POCT CARDIAC MARKERS   Results for orders placed during the hospital encounter of 07/02/11  CBC      Component Value Range   WBC 8.8  4.0 - 10.5 (K/uL)   RBC 4.85  3.87 - 5.11 (MIL/uL)   Hemoglobin 14.8  12.0 - 15.0 (g/dL)   HCT 11.9  14.7 - 82.9 (%)   MCV 87.6  78.0 - 100.0 (fL)    MCH 30.5  26.0 - 34.0 (pg)   MCHC 34.8  30.0 - 36.0 (g/dL)   RDW 56.2  13.0 - 86.5 (%)   Platelets 209  150 - 400 (K/uL)  BASIC METABOLIC PANEL      Component Value Range   Sodium 140  135 - 145 (mEq/L)   Potassium 4.0  3.5 - 5.1 (mEq/L)   Chloride 106  96 - 112 (mEq/L)   CO2 26  19 - 32 (mEq/L)   Glucose, Bld 83  70 - 99 (mg/dL)   BUN 10  6 - 23 (mg/dL)   Creatinine, Ser 7.84  0.50 - 1.10 (mg/dL)   Calcium 9.5  8.4 - 69.6 (mg/dL)   GFR calc non Af Amer >90  >90 (mL/min)   GFR calc Af Amer >90  >90 (mL/min)  D-DIMER, QUANTITATIVE      Component Value Range   D-Dimer, Quant 0.28  0.00 - 0.48 (ug/mL-FEU)  POCT I-STAT TROPONIN I      Component Value Range   Troponin i, poc 0.00  0.00 - 0.08 (ng/mL)   Comment 3            Dg Chest 2 View  07/02/2011  *RADIOLOGY REPORT*  Clinical Data: Chest pain  CHEST - 2 VIEW  Comparison: 06/24/2009  Findings: Cardiomediastinal silhouette is stable.  No acute infiltrate or pleural effusion.  No pulmonary edema.  Mild degenerative changes thoracic spine.  IMPRESSION: No active disease.  No significant change.  Original Report Authenticated By: Natasha Mead, M.D.    Images viewed by me. No results found. ECG shows normal sinus rhythm with a rate of 99, no ectopy. Normal axis. Normal P wave. Normal QRS. Normal intervals. Normal ST and T waves. Impression: normal ECG. Compared with ECG of 06/24/2009, no significant changes were found   No diagnosis found.    MDM  Chest pain which does not seem to be typical for coronary disease. Specific etiology is not clear at this point. Workup is initiated to rule out serious causes of chest pain including pneumonia, acute coronary syndrome, pulmonary embolism.        Dione Booze, MD 07/02/11 [phone]

## 2011-11-12 ENCOUNTER — Ambulatory Visit (INDEPENDENT_AMBULATORY_CARE_PROVIDER_SITE_OTHER): Payer: BC Managed Care – PPO | Admitting: Emergency Medicine

## 2011-11-12 ENCOUNTER — Encounter: Payer: Self-pay | Admitting: Emergency Medicine

## 2011-11-12 VITALS — BP 109/78 | HR 84 | Temp 98.0°F | Ht 64.25 in | Wt 219.3 lb

## 2011-11-12 DIAGNOSIS — Z0000 Encounter for general adult medical examination without abnormal findings: Secondary | ICD-10-CM

## 2011-11-12 DIAGNOSIS — M7989 Other specified soft tissue disorders: Secondary | ICD-10-CM

## 2011-11-12 DIAGNOSIS — L819 Disorder of pigmentation, unspecified: Secondary | ICD-10-CM

## 2011-11-12 DIAGNOSIS — R238 Other skin changes: Secondary | ICD-10-CM

## 2011-11-12 MED ORDER — ALBUTEROL SULFATE HFA 108 (90 BASE) MCG/ACT IN AERS: 2.0000 | INHALATION_SPRAY | RESPIRATORY_TRACT | Status: DC | PRN

## 2011-11-12 NOTE — Assessment & Plan Note (Signed)
DDx includes a viral arthropathy, degenerative arthritis, or RA.  Does not have the clinical signs of RA.  Improving.  Will monitor.  Continue motrin for pain. Will check ESR.

## 2011-11-12 NOTE — Assessment & Plan Note (Signed)
May be a result of edema in hands.  However, with family history or CAD, will obtain ABIs.

## 2011-11-12 NOTE — Patient Instructions (Signed)
It was nice to meet you!  We drew some blood today.  I will call with if anything is wrong, otherwise I will send you a letter with the results.  Please make an appointment for ABIs with Dr. Raymondo Band.  Please also make an appointment to replace your IUD.  I will see you back in 1-2 months to discuss your other concerns.

## 2011-11-12 NOTE — Assessment & Plan Note (Addendum)
Will need pap.  To be seen in women's health clinic for IUD removal and placement.  Will also check lipids and cmet for cholesterol and sugar/renal/liver.

## 2011-11-12 NOTE — Progress Notes (Addendum)
  Subjective:    Patient ID: Monica Sandoval, female    DOB: [birth year], 36 y.o.   MRN: [id number]  HPI GILLIAN MEEUWSEN is here to establish and care and address hand swelling.  1. Establish care: I have reviewed and updated the following as appropriate: allergies, current medications, past family history, past medical history, past social history, past surgical history and problem list.  She is due for a pap smear.  Currently has IUD that was place 5 years ago - wants it to be replaced.  2. Hand swelling: This started 2 months ago and has started to improve in the last week.  No trigger identified.  Worse in right middle PIP joint and left index PIP, some intermittent pain in these joints as well.  In general R is worse than L.  Associated with hands intermittently turning purple.  Works at [employer], but activities have not changed recently.  Was treated for an acute bronchitis in November.  Mom with RA.  No morning stiffness or claudication type symptoms.  No decreased sensation or strength.  FHx: Mom with RA, dad with CAD SHx: current smoker  Review of Systems See HPI    Objective:   Physical Exam BP 109/78  Pulse 84  Temp(Src) 98 F (36.7 C) (Oral)  Ht 5' 4.25" (1.632 m)  Wt 219 lb 4.8 oz (99.474 kg)  BMI 37.35 kg/m2 Gen: alert, NAD, cooperative HEENT: AT/[place], sclera white, PERRL, normal nasal mucosa, no pharyngeal erythema or exudate, TMs normal bilaterally, R ear canal has yellow deposit at base of TM Neck: supple, no LAD CV: RRR, no murmurs Pulm: CTAB, no wheezes or rales Abd: soft, NTND Hands: swelling of right middle PIP and left index PIP, no erythema, hands appear slightly purplish (R>L), 1+ radial pulses bilaterally, full range of motion, mild TTP of right middle PIP and left index PIP joints.  Negative tinel's, phalen's caused right hand numbness Ext: no pedal edema, right foot appears slightly purplish Neuro: gait normal Psych: good judgement and  insight     Assessment & Plan:

## 2011-11-13 LAB — COMPREHENSIVE METABOLIC PANEL
ALT: 14 U/L (ref 0–35)
AST: 13 U/L (ref 0–37)
CO2: 25 mEq/L (ref 19–32)
Chloride: 106 mEq/L (ref 96–112)
Creat: 0.89 mg/dL (ref 0.50–1.10)
Sodium: 139 mEq/L (ref 135–145)
Total Bilirubin: 0.3 mg/dL (ref 0.3–1.2)
Total Protein: 6.7 g/dL (ref 6.0–8.3)

## 2011-11-13 LAB — LIPID PANEL
HDL: 53 mg/dL (ref >39)
LDL Cholesterol: 107 mg/dL — ABNORMAL HIGH (ref 0–99)
Total CHOL/HDL Ratio: 3.3 Ratio

## 2011-11-13 LAB — SEDIMENTATION RATE: Sed Rate: 4 mm/hr (ref 0–22)

## 2011-11-15 ENCOUNTER — Encounter: Payer: Self-pay | Admitting: Emergency Medicine

## 2011-11-30 ENCOUNTER — Ambulatory Visit: Payer: BC Managed Care – PPO | Admitting: Pharmacist

## 2011-12-09 ENCOUNTER — Ambulatory Visit: Payer: BC Managed Care – PPO | Admitting: Family Medicine

## 2011-12-16 ENCOUNTER — Ambulatory Visit: Payer: BC Managed Care – PPO

## 2012-01-21 ENCOUNTER — Encounter: Payer: Self-pay | Admitting: Emergency Medicine

## 2012-01-21 ENCOUNTER — Ambulatory Visit (INDEPENDENT_AMBULATORY_CARE_PROVIDER_SITE_OTHER): Payer: Self-pay | Admitting: Emergency Medicine

## 2012-01-21 VITALS — BP 109/75 | HR 79 | Temp 98.8°F | Ht 64.25 in | Wt 225.0 lb

## 2012-01-21 DIAGNOSIS — H6690 Otitis media, unspecified, unspecified ear: Secondary | ICD-10-CM | POA: Insufficient documentation

## 2012-01-21 DIAGNOSIS — J029 Acute pharyngitis, unspecified: Secondary | ICD-10-CM

## 2012-01-21 LAB — POCT RAPID STREP A (OFFICE): Rapid Strep A Screen: NEGATIVE

## 2012-01-21 MED ORDER — AMOXICILLIN 500 MG PO CAPS: 500.0000 mg | ORAL_CAPSULE | Freq: Three times a day (TID) | ORAL | Status: AC

## 2012-01-21 NOTE — Patient Instructions (Signed)
It was good to see you!  You have an ear infection.  I have given you a prescription for an antibiotic.  It might be free at [facility].  I would also like you to get some nasal saline spray.  Use this 2-3 times a day to wash out your sinuses.  If things aren't getting better in the next few days to a week, come back.

## 2012-01-21 NOTE — Assessment & Plan Note (Addendum)
May be viral or bacterial. Rapid strep negative. Will treat with amoxicillin 500mg  TID x10 days.  Will also have her use saline nasal spray to wash out the sinuses.  Return to clinic if not improving or develops fever.

## 2012-01-21 NOTE — Progress Notes (Signed)
  Subjective:    Patient ID: Monica Sandoval, female    DOB: 1976-02-15, 36 y.o.   MRN: [id number]  HPI KIMLA FURTH is here for a SDA for right ear pain.  This started over the weekend with a right sided headache.  The ear pain started on Tuesday when she could also feel fluid in the ear.  On Wednesday, she started having trouble hearing out of that ear.  Has also had increased pressure in the right ear.  Also has had some nasal congestion and drainage as well as sore throat.  Her youngest child had strep throat about 1 month ago.  I have reviewed and updated the following as appropriate: allergies and current medications  Review of Systems See HPI    Objective:   Physical Exam BP 109/75  Pulse 79  Temp(Src) 98.8 F (37.1 C) (Oral)  Ht 5' 4.25" (1.632 m)  Wt 225 lb (102.059 kg)  BMI 38.32 kg/m2 Gen: alert, cooperative, NAD HEENT: AT/[place], EOMI, sclera white, mild TTP over right maxillary sinus, L TM normal, R TM erythematous with fluid and bulla at 9-11 o'clock, no pharyngeal erythema or exudate, no cervical LAD  Rapid Strep negative    Assessment & Plan:

## 2013-02-24 ENCOUNTER — Emergency Department (HOSPITAL_COMMUNITY): Payer: Self-pay

## 2013-02-24 ENCOUNTER — Emergency Department (HOSPITAL_COMMUNITY)
Admission: EM | Admit: 2013-02-24 | Discharge: 2013-02-24 | Disposition: A | Discharge status: Home / Self Care | Payer: Self-pay | Attending: Emergency Medicine | Admitting: Emergency Medicine

## 2013-02-24 DIAGNOSIS — X503XXA Overexertion from repetitive movements, initial encounter: Secondary | ICD-10-CM | POA: Insufficient documentation

## 2013-02-24 DIAGNOSIS — Z79899 Other long term (current) drug therapy: Secondary | ICD-10-CM | POA: Insufficient documentation

## 2013-02-24 DIAGNOSIS — S46909A Unspecified injury of unspecified muscle, fascia and tendon at shoulder and upper arm level, unspecified arm, initial encounter: Secondary | ICD-10-CM | POA: Insufficient documentation

## 2013-02-24 DIAGNOSIS — Y9389 Activity, other specified: Secondary | ICD-10-CM | POA: Insufficient documentation

## 2013-02-24 DIAGNOSIS — Y929 Unspecified place or not applicable: Secondary | ICD-10-CM | POA: Insufficient documentation

## 2013-02-24 DIAGNOSIS — S4980XA Other specified injuries of shoulder and upper arm, unspecified arm, initial encounter: Secondary | ICD-10-CM | POA: Insufficient documentation

## 2013-02-24 DIAGNOSIS — F17200 Nicotine dependence, unspecified, uncomplicated: Secondary | ICD-10-CM | POA: Insufficient documentation

## 2013-02-24 MED ORDER — IBUPROFEN 800 MG PO TABS: 800.0000 mg | ORAL_TABLET | Freq: Three times a day (TID) | ORAL | Status: DC

## 2013-02-24 MED ORDER — HYDROCODONE-ACETAMINOPHEN 5-325 MG PO TABS: 1.0000 | ORAL_TABLET | Freq: Four times a day (QID) | ORAL | Status: DC | PRN

## 2013-02-24 NOTE — ED Provider Notes (Signed)
History    This chart was scribed for non-physician practitioner  Monica Pel PA-cworking with Monica Sandoval. Monica Payor, MD by Monica Sandoval, ED scribe. This patient was seen in room WTR6/WTR6 and the patient's care was started at 5:00 PM. CSN: [id number] Arrival date & time 02/24/13  1634    Chief Complaint  Patient presents with  . Shoulder Pain    The history is provided by the patient and medical records. No language interpreter was used.   HPI Comments: Monica Sandoval is a 37 y.o. female who presents to the Emergency Department complaining of severe left shoulder pain that started while swatting at a bug 3 days ago. Pt describes pain as "buring coldness" and as "agnoizing" that presented immediately with a pop. Pt reports feeling "sharp" pain  and soreness that radiates from shoulder to hand, L side of neck and L side of upper chest. Pt reports decreased ROM and is guarding with the arm internally rotated held at waist. PTA pt took ibuprofen with no relief. 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 PT denies fever, nausea, vomiting, diarrhea. No CP, wheezing or SOB. No past medical history on file. Past Surgical History  Procedure Laterality Date  . Cholecystectomy  2008   Family History  Problem Relation Age of Onset  . Asthma Mother   . Rheum arthritis Mother   . Arthritis Mother   . Diabetes Father   . Heart disease Father   . Early death Father   . Diabetes Paternal Grandmother   . Heart disease Paternal Grandmother   . Cancer Paternal Grandmother    History  Substance Use Topics  . Smoking status: Current Every Day Smoker -- 0.80 packs/day for 8 years    Types: Cigarettes  . Smokeless tobacco: Not on file     Comment: "thinking about quitting"  . Alcohol Use: No   OB History   Grav Para Term Preterm Abortions TAB SAB Ect Mult Living                 Review of Systems  Musculoskeletal:       L shoulder pain that radiates  to hands, L side of neck and L upper chest.  Decreased ROM  All other systems reviewed and are negative.    Allergies  Review of patient's allergies indicates no known allergies.  Home Medications   Current Outpatient Rx  Name  Route  Sig  Dispense  Refill  . albuterol (PROVENTIL HFA;VENTOLIN HFA) 108 (90 BASE) MCG/ACT inhaler   Inhalation   Inhale 2 puffs into the lungs every 4 (four) hours as needed for wheezing.   1 Inhaler   3   . ibuprofen (ADVIL,MOTRIN) 200 MG tablet   Oral   Take 800 mg by mouth every 8 (eight) hours as needed.          Marland Kitchen omeprazole (PRILOSEC) 20 MG capsule   Oral   Take 20 mg by mouth daily.         Marland Kitchen HYDROcodone-acetaminophen (NORCO/VICODIN) 5-325 MG per tablet   Oral   Take 1-2 tablets by mouth every 6 (six) hours as needed for pain.   20 tablet   0   . ibuprofen (ADVIL,MOTRIN) 800 MG tablet   Oral   Take 1 tablet (800 mg total) by mouth 3 (three) times daily.   21 tablet   0    BP 111/70  Pulse 80  Temp(Src) 98.2  F (36.8 C) (Oral)  Resp 16  SpO2 98% Physical Exam  Nursing note and vitals reviewed. Constitutional: She appears well-developed and well-nourished. No distress.  HENT:  Head: Normocephalic and atraumatic.  Mouth/Throat: Oropharynx is clear and moist. No oropharyngeal exudate.  Eyes: Conjunctivae are normal. No scleral icterus.  Neck: Normal range of motion. Neck supple.  Cardiovascular: Normal rate, regular rhythm and intact distal pulses.   Pulmonary/Chest: Effort normal and breath sounds normal. No respiratory distress. She has no wheezes.  Abdominal: Soft. Bowel sounds are normal. She exhibits no mass. There is no tenderness. There is no rebound and no guarding.  Musculoskeletal: She exhibits no edema.       Left shoulder: She exhibits decreased range of motion, tenderness (to detloid), pain and spasm. She exhibits no bony tenderness, no swelling, no effusion, no crepitus, no deformity, no laceration, normal  pulse and normal strength.  Neurological: She is alert.  Speech is clear and goal oriented Moves extremities without ataxia  Skin: Skin is warm and dry. She is not diaphoretic.  Psychiatric: She has a normal mood and affect.    ED Course  Procedures (including critical care time) DIAGNOSTIC STUDIES: Oxygen Saturation is 98% on room air, normal by my interpretation.   COORDINATION OF CARE: 5:50 PM Discussed course of care with pt which includes images of left shoulder. Pt understands and agrees.   Labs Reviewed - No data to display Dg Shoulder Left  02/24/2013   *RADIOLOGY REPORT*  Clinical Data: Left shoulder pain since an injury 2 days ago.  LEFT SHOULDER - 2+ VIEW  Comparison: None.  Findings: There is no fracture, dislocation, arthritis, or soft tissue calcification.  IMPRESSION: Normal exam.   Original Report Authenticated By: Monica Sandoval, M.D.   1. Shoulder injury, initial encounter     MDM  Possible rotator cuff tear. Pt says she can not able ot have surgery at her job because she may get laid off. Says she does not want to see a Ortho.  I gave her a shoulder immobilizer and told her that she can not stay in it for more than a few weeks for risk of frozen joint. Ive also advised if she does have ligament damage and not getting better, she needs to follow-up with the specialist.  Rx: Ibuprofen and Vicodin.  37 y.o.Monica Sandoval evaluation in the Emergency Department is complete. It has been determined that no acute conditions requiring further emergency intervention are present at this time. The patient/guardian have been advised of the diagnosis and plan. We have discussed signs and symptoms that warrant return to the ED, such as changes or worsening in symptoms.  Vital signs are stable at discharge. Filed Vitals:   02/24/13 1655  BP: 111/70  Pulse: 80  Temp: 98.2 F (36.8 C)  Resp: 16    Patient/guardian has voiced understanding and agreed to follow-up with  the PCP or specialist.  I personally performed the services described in this documentation, which was scribed in my presence. The recorded information has been reviewed and is accurate.    Monica Matas, PA-C 02/24/13 1753

## 2013-02-24 NOTE — ED Notes (Signed)
Pt c/o L shoulder pain since Thursday. Pt states she was swatting a mosquito and injured her L shoulder. Pt states pain radiates to upper chest and L side of neck. ROM decreased due to pain. Pt ambulatory to exam room with steady gait. Pt states she drove herself here.

## 2013-02-24 NOTE — ED Provider Notes (Signed)
Medical screening examination/treatment/procedure(s) were performed by non-physician practitioner and as supervising physician I was immediately available for consultation/collaboration.  Janmarie Smoot R. Nastasia Kage, MD 02/24/13 2153 

## 2013-03-05 ENCOUNTER — Other Ambulatory Visit (HOSPITAL_COMMUNITY): Payer: Self-pay | Admitting: Orthopedic Surgery

## 2013-03-05 DIAGNOSIS — M25512 Pain in left shoulder: Secondary | ICD-10-CM

## 2013-03-08 ENCOUNTER — Ambulatory Visit (HOSPITAL_COMMUNITY)
Admission: RE | Admit: 2013-03-08 | Discharge: 2013-03-08 | Disposition: A | Discharge status: Home / Self Care | Payer: Self-pay | Source: Ambulatory Visit | Attending: Orthopedic Surgery | Admitting: Orthopedic Surgery

## 2013-03-08 DIAGNOSIS — M629 Disorder of muscle, unspecified: Secondary | ICD-10-CM | POA: Insufficient documentation

## 2013-03-08 DIAGNOSIS — M25519 Pain in unspecified shoulder: Secondary | ICD-10-CM | POA: Insufficient documentation

## 2013-03-08 DIAGNOSIS — M2420 Disorder of ligament, unspecified site: Secondary | ICD-10-CM | POA: Insufficient documentation

## 2013-03-08 DIAGNOSIS — M899 Disorder of bone, unspecified: Secondary | ICD-10-CM | POA: Insufficient documentation

## 2013-03-08 DIAGNOSIS — M25512 Pain in left shoulder: Secondary | ICD-10-CM

## 2013-03-08 MED ORDER — IOHEXOL 300 MG/ML  SOLN
5.0000 mL | Freq: Once | INTRAMUSCULAR | Status: AC | PRN
  Administered 2013-03-08: 5 mL via INTRAVENOUS

## 2013-03-08 MED ORDER — GADOBENATE DIMEGLUMINE 529 MG/ML IV SOLN
5.0000 mL | Freq: Once | INTRAVENOUS | Status: AC | PRN
  Administered 2013-03-08: 1 mL via INTRAVENOUS

## 2014-02-28 ENCOUNTER — Emergency Department (HOSPITAL_COMMUNITY)
Admission: EM | Admit: 2014-02-28 | Discharge: 2014-02-28 | Disposition: A | Discharge status: Home / Self Care | Payer: 59 | Attending: Emergency Medicine | Admitting: Emergency Medicine

## 2014-02-28 ENCOUNTER — Encounter (HOSPITAL_COMMUNITY): Payer: Self-pay | Admitting: Emergency Medicine

## 2014-02-28 ENCOUNTER — Emergency Department (HOSPITAL_COMMUNITY): Payer: 59

## 2014-02-28 DIAGNOSIS — S0990XA Unspecified injury of head, initial encounter: Secondary | ICD-10-CM | POA: Diagnosis not present

## 2014-02-28 DIAGNOSIS — S199XXA Unspecified injury of neck, initial encounter: Secondary | ICD-10-CM | POA: Diagnosis present

## 2014-02-28 DIAGNOSIS — J45909 Unspecified asthma, uncomplicated: Secondary | ICD-10-CM | POA: Insufficient documentation

## 2014-02-28 DIAGNOSIS — S298XXA Other specified injuries of thorax, initial encounter: Secondary | ICD-10-CM | POA: Insufficient documentation

## 2014-02-28 DIAGNOSIS — IMO0002 Reserved for concepts with insufficient information to code with codable children: Secondary | ICD-10-CM | POA: Insufficient documentation

## 2014-02-28 DIAGNOSIS — Z79899 Other long term (current) drug therapy: Secondary | ICD-10-CM | POA: Diagnosis not present

## 2014-02-28 DIAGNOSIS — Z791 Long term (current) use of non-steroidal anti-inflammatories (NSAID): Secondary | ICD-10-CM | POA: Insufficient documentation

## 2014-02-28 DIAGNOSIS — F17200 Nicotine dependence, unspecified, uncomplicated: Secondary | ICD-10-CM | POA: Diagnosis not present

## 2014-02-28 DIAGNOSIS — Y92410 Unspecified street and highway as the place of occurrence of the external cause: Secondary | ICD-10-CM | POA: Diagnosis not present

## 2014-02-28 DIAGNOSIS — S0993XA Unspecified injury of face, initial encounter: Secondary | ICD-10-CM | POA: Diagnosis present

## 2014-02-28 DIAGNOSIS — Y9389 Activity, other specified: Secondary | ICD-10-CM | POA: Diagnosis not present

## 2014-02-28 DIAGNOSIS — T148XXA Other injury of unspecified body region, initial encounter: Secondary | ICD-10-CM

## 2014-02-28 HISTORY — DX: Unspecified asthma, uncomplicated: J45.909

## 2014-02-28 MED ORDER — CYCLOBENZAPRINE HCL 10 MG PO TABS: 10.0000 mg | ORAL_TABLET | Freq: Two times a day (BID) | ORAL | Status: DC | PRN
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
## 2014-02-28 NOTE — ED Provider Notes (Signed)
Medical screening examination/treatment/procedure(s) were performed by non-physician practitioner and as supervising physician I was immediately available for consultation/collaboration.   EKG Interpretation None        Candyce ChurnJohn David Annalynn Centanni III, MD 02/28/14 [phone]
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
## 2014-02-28 NOTE — ED Notes (Signed)
Patient transported to X-ray 

## 2014-02-28 NOTE — Discharge Instructions (Signed)
Muscle Strain °A muscle strain is an injury that occurs when a muscle is stretched beyond its normal length. Usually a small number of muscle fibers are torn when this happens. Muscle strain is rated in degrees. First-degree strains have the least amount of muscle fiber tearing and pain. Second-degree and third-degree strains have increasingly more tearing and pain.  °Usually, recovery from muscle strain takes 1-2 weeks. Complete healing takes 5-6 weeks.  °CAUSES  °Muscle strain happens when a sudden, violent force placed on a muscle stretches it too far. This may occur with lifting, sports, or a fall.  °RISK FACTORS °Muscle strain is especially common in athletes.  °SIGNS AND SYMPTOMS °At the site of the muscle strain, there may be: °· Pain. °· Bruising. °· Swelling. °· Difficulty using the muscle due to pain or lack of normal function. °DIAGNOSIS  °Your health care provider will perform a physical exam and ask about your medical history. °TREATMENT  °Often, the best treatment for a muscle strain is resting, icing, and applying cold compresses to the injured area.   °HOME CARE INSTRUCTIONS  °· Use the PRICE method of treatment to promote muscle healing during the first 2-3 days after your injury. The PRICE method involves: °¨ Protecting the muscle from being injured again. °¨ Restricting your activity and resting the injured body part. °¨ Icing your injury. To do this, put ice in a plastic bag. Place a towel between your skin and the bag. Then, apply the ice and leave it on from 15-20 minutes each hour. After the third day, switch to moist heat packs. °¨ Apply compression to the injured area with a splint or elastic bandage. Be careful not to wrap it too tightly. This may interfere with blood circulation or increase swelling. °¨ Elevate the injured body part above the level of your heart as often as you can. °· Only take over-the-counter or prescription medicines for pain, discomfort, or fever as directed by your  health care provider. °· Warming up prior to exercise helps to prevent future muscle strains. °SEEK MEDICAL CARE IF:  °· You have increasing pain or swelling in the injured area. °· You have numbness, tingling, or a significant loss of strength in the injured area. °MAKE SURE YOU:  °· Understand these instructions. °· Will watch your condition. °· Will get help right away if you are not doing well or get worse. °Document Released: 08/09/2005 Document Revised: 05/30/2013 Document Reviewed: 03/08/2013 °ExitCare® Patient Information ©2015 [employer]. This information is not intended to replace advice given to you by your health care provider. Make sure you discuss any questions you have with your health care provider. ° °Motor Vehicle Collision  °It is common to have multiple bruises and sore muscles after a motor vehicle collision (MVC). These tend to feel worse for the first 24 hours. You may have the most stiffness and soreness over the first several hours. You may also feel worse when you wake up the first morning after your collision. After this point, you will usually begin to improve with each day. The speed of improvement often depends on the severity of the collision, the number of injuries, and the location and nature of these injuries. °HOME CARE INSTRUCTIONS  °· Put ice on the injured area. °¨ Put ice in a plastic bag. °¨ Place a towel between your skin and the bag. °¨ Leave the ice on for 15-20 minutes, 3-4 times a day, or as directed by your health care provider. °· Drink enough   fluids to keep your urine clear or pale yellow. Do not drink alcohol. °· Take a warm shower or bath once or twice a day. This will increase blood flow to sore muscles. °· You may return to activities as directed by your caregiver. Be careful when lifting, as this may aggravate neck or back pain. °· Only take over-the-counter or prescription medicines for pain, discomfort, or fever as directed by your caregiver. Do not use  aspirin. This may increase bruising and bleeding. °SEEK IMMEDIATE MEDICAL CARE IF: °· You have numbness, tingling, or weakness in the arms or legs. °· You develop severe headaches not relieved with medicine. °· You have severe neck pain, especially tenderness in the middle of the back of your neck. °· You have changes in bowel or bladder control. °· There is increasing pain in any area of the body. °· You have shortness of breath, lightheadedness, dizziness, or fainting. °· You have chest pain. °· You feel sick to your stomach (nauseous), throw up (vomit), or sweat. °· You have increasing abdominal discomfort. °· There is blood in your urine, stool, or vomit. °· You have pain in your shoulder (shoulder strap areas). °· You feel your symptoms are getting worse. °MAKE SURE YOU:  °· Understand these instructions. °· Will watch your condition. °· Will get help right away if you are not doing well or get worse. °Document Released: 08/09/2005 Document Revised: 08/14/2013 Document Reviewed: 01/06/2011 °ExitCare® Patient Information ©2015 [employer]. This information is not intended to replace advice given to you by your health care provider. Make sure you discuss any questions you have with your health care provider. ° °

## 2014-04-05 ENCOUNTER — Emergency Department (HOSPITAL_COMMUNITY): Payer: 59

## 2014-04-05 ENCOUNTER — Emergency Department (HOSPITAL_COMMUNITY)
Admission: EM | Admit: 2014-04-05 | Discharge: 2014-04-05 | Disposition: A | Discharge status: Home / Self Care | Payer: 59 | Attending: Emergency Medicine | Admitting: Emergency Medicine

## 2014-04-05 ENCOUNTER — Encounter (HOSPITAL_COMMUNITY): Payer: Self-pay | Admitting: Emergency Medicine

## 2014-04-05 DIAGNOSIS — R059 Cough, unspecified: Secondary | ICD-10-CM | POA: Diagnosis present

## 2014-04-05 DIAGNOSIS — M545 Low back pain, unspecified: Secondary | ICD-10-CM | POA: Insufficient documentation

## 2014-04-05 DIAGNOSIS — Z79899 Other long term (current) drug therapy: Secondary | ICD-10-CM | POA: Diagnosis not present

## 2014-04-05 DIAGNOSIS — F17200 Nicotine dependence, unspecified, uncomplicated: Secondary | ICD-10-CM | POA: Insufficient documentation

## 2014-04-05 DIAGNOSIS — J45901 Unspecified asthma with (acute) exacerbation: Secondary | ICD-10-CM | POA: Diagnosis not present

## 2014-04-05 DIAGNOSIS — R05 Cough: Secondary | ICD-10-CM | POA: Diagnosis present

## 2014-04-05 DIAGNOSIS — R112 Nausea with vomiting, unspecified: Secondary | ICD-10-CM | POA: Insufficient documentation

## 2014-04-05 DIAGNOSIS — J40 Bronchitis, not specified as acute or chronic: Secondary | ICD-10-CM

## 2014-04-05 MED ORDER — IPRATROPIUM-ALBUTEROL 0.5-2.5 (3) MG/3ML IN SOLN
3.0000 mL | Freq: Once | RESPIRATORY_TRACT | Status: AC
  Administered 2014-04-05: 3 mL via RESPIRATORY_TRACT
  Filled 2014-04-05: qty 3

## 2014-04-05 MED ORDER — BENZONATATE 100 MG PO CAPS: 100.0000 mg | ORAL_CAPSULE | Freq: Three times a day (TID) | ORAL | Status: DC

## 2014-04-05 MED ORDER — CETIRIZINE-PSEUDOEPHEDRINE ER 5-120 MG PO TB12: 1.0000 | ORAL_TABLET | Freq: Two times a day (BID) | ORAL | Status: DC

## 2014-04-05 MED ORDER — AZITHROMYCIN 250 MG PO TABS: 250.0000 mg | ORAL_TABLET | Freq: Every day | ORAL | Status: DC

## 2014-04-05 MED ORDER — ALBUTEROL SULFATE HFA 108 (90 BASE) MCG/ACT IN AERS
2.0000 | INHALATION_SPRAY | RESPIRATORY_TRACT | Status: DC | PRN
  Administered 2014-04-05: 2 via RESPIRATORY_TRACT
  Filled 2014-04-05: qty 6.7

## 2014-04-05 MED ORDER — GUAIFENESIN ER 600 MG PO TB12: 600.0000 mg | ORAL_TABLET | Freq: Two times a day (BID) | ORAL | Status: DC

## 2014-04-05 NOTE — Discharge Instructions (Signed)
Please use the medications prescribed to help with your bronchitis infection. Followup with a primary care provider for continued evaluation and treatment.   Acute Bronchitis Bronchitis is inflammation of the airways that extend from the windpipe into the lungs (bronchi). The inflammation often causes mucus to develop. This leads to a cough, which is the most common symptom of bronchitis.  In acute bronchitis, the condition usually develops suddenly and goes away over time, usually in a couple weeks. Smoking, allergies, and asthma can make bronchitis worse. Repeated episodes of bronchitis may cause further lung problems.  CAUSES Acute bronchitis is most often caused by the same virus that causes a cold. The virus can spread from person to person (contagious) through coughing, sneezing, and touching contaminated objects. SIGNS AND SYMPTOMS   Cough.   Fever.   Coughing up mucus.   Body aches.   Chest congestion.   Chills.   Shortness of breath.   Sore throat.  DIAGNOSIS  Acute bronchitis is usually diagnosed through a physical exam. Your health care provider will also ask you questions about your medical history. Tests, such as chest X-rays, are sometimes done to rule out other conditions.  TREATMENT  Acute bronchitis usually goes away in a couple weeks. Oftentimes, no medical treatment is necessary. Medicines are sometimes given for relief of fever or cough. Antibiotic medicines are usually not needed but may be prescribed in certain situations. In some cases, an inhaler may be recommended to help reduce shortness of breath and control the cough. A cool mist vaporizer may also be used to help thin bronchial secretions and make it easier to clear the chest.  HOME CARE INSTRUCTIONS  Get plenty of rest.   Drink enough fluids to keep your urine clear or pale yellow (unless you have a medical condition that requires fluid restriction). Increasing fluids may help thin your  respiratory secretions (sputum) and reduce chest congestion, and it will prevent dehydration.   Take medicines only as directed by your health care provider.  If you were prescribed an antibiotic medicine, finish it all even if you start to feel better.  Avoid smoking and secondhand smoke. Exposure to cigarette smoke or irritating chemicals will make bronchitis worse. If you are a smoker, consider using nicotine gum or skin patches to help control withdrawal symptoms. Quitting smoking will help your lungs heal faster.   Reduce the chances of another bout of acute bronchitis by washing your hands frequently, avoiding people with cold symptoms, and trying not to touch your hands to your mouth, nose, or eyes.   Keep all follow-up visits as directed by your health care provider.  SEEK MEDICAL CARE IF: Your symptoms do not improve after 1 week of treatment.  SEEK IMMEDIATE MEDICAL CARE IF:  You develop an increased fever or chills.   You have chest pain.   You have severe shortness of breath.  You have bloody sputum.   You develop dehydration.  You faint or repeatedly feel like you are going to pass out.  You develop repeated vomiting.  You develop a severe headache. MAKE SURE YOU:   Understand these instructions.  Will watch your condition.  Will get help right away if you are not doing well or get worse. Document Released: 09/16/2004 Document Revised: 12/24/2013 Document Reviewed: 01/30/2013 [facility] Patient Information 2015 [place], [place]. This information is not intended to replace advice given to you by your health care provider. Make sure you discuss any questions you have with your health care provider.

## 2014-04-05 NOTE — ED Notes (Addendum)
Pt reports cough headache with right side rib pain from coughing. Pt does have wheeze on right side. Reports fever home x 2 days. Pt says "I have a hard time getting my breath".

## 2014-04-05 NOTE — ED Notes (Signed)
Pt ambulatory to exam room with steady gait.  

## 2014-04-05 NOTE — ED Provider Notes (Signed)
CSN: [id number]     Arrival date & time 04/05/14  1802 History  This chart was scribed for non-physician practitioner working with Mirian MoMatthew Gentry, MD, by Roxy Cedarhandni Bhalodia ED Scribe. This patient was seen in room WTR6/WTR6 and the patient's care was started at 8:21 PM  Chief Complaint  Patient presents with  . Shortness of Breath  . Cough  . Headache   The history is provided by the patient. No language interpreter was used.    HPI Comments: Monica Sandoval is a 38 y.o. female who presents to the Emergency Department complaining of a gradually worsening cough and congestion onset 5 days ago. Pt. states that she is feels stuffy.  Patient has associated bilateral lower back pain, and the right sided lower back pain began this morning.  Patient complains of associated headache, sore throat, chills, nausea/vomitting, and lightheadedness. Pt states that she was prescribed an inhaler in past but has not used one to treat symptoms.  Patient states that she is a smoker. No other aggravating or alleviating factors. No other associated symptoms.   Past Medical History  Diagnosis Date  . Asthma    Past Surgical History  Procedure Laterality Date  . Cholecystectomy  2008   Family History  Problem Relation Age of Onset  . Asthma Mother   . Rheum arthritis Mother   . Arthritis Mother   . Diabetes Father   . Heart disease Father   . Early death Father   . Diabetes Paternal Grandmother   . Heart disease Paternal Grandmother   . Cancer Paternal Grandmother    History  Substance Use Topics  . Smoking status: Current Every Day Smoker -- 0.50 packs/day for 8 years    Types: Cigarettes  . Smokeless tobacco: Not on file     Comment: "thinking about quitting"  . Alcohol Use: No   OB History   Grav Para Term Preterm Abortions TAB SAB Ect Mult Living                 Review of Systems  Constitutional: Positive for chills. Negative for fever and appetite change.  HENT: Positive for congestion.    Respiratory: Positive for cough.   Gastrointestinal: Positive for nausea and vomiting. Negative for diarrhea.  Musculoskeletal: Positive for back pain (Bilateral lower back).  Neurological: Positive for light-headedness.  All other systems reviewed and are negative.   Allergies  Review of patient's allergies indicates no known allergies.  Home Medications   Prior to Admission medications   Medication Sig Start Date End Date Taking? Authorizing Provider  esomeprazole (NEXIUM) 20 MG capsule Take 20 mg by mouth daily at 12 noon.   Yes Historical Provider, MD  ibuprofen (ADVIL,MOTRIN) 200 MG tablet Take 800 mg by mouth every 8 (eight) hours as needed (pain.).    Yes Historical Provider, MD  albuterol (PROVENTIL HFA;VENTOLIN HFA) 108 (90 BASE) MCG/ACT inhaler Inhale 2 puffs into the lungs every 4 (four) hours as needed for wheezing. 11/12/11 02/28/14  Charm RingsErin J Honig, MD   Triage Vitals: BP 120/78  Pulse 91  Temp(Src) 98.5 F (36.9 C) (Oral)  Resp 18  SpO2 97% Physical Exam  Nursing note and vitals reviewed. Constitutional: She is oriented to person, place, and time. She appears well-developed and well-nourished. No distress.  HENT:  Head: Normocephalic.  Mouth/Throat: Oropharynx is clear and moist.  There is nasal mucosal edema.  Neck: Normal range of motion. Neck supple.  Cardiovascular: Normal rate and regular rhythm.   Pulmonary/Chest: Effort  normal. No respiratory distress. She has wheezes. She has no rales. She exhibits no tenderness.  Very slight end expiratory wheeze  Abdominal: Soft.  Musculoskeletal: Normal range of motion.  Lymphadenopathy:    She has no cervical adenopathy.  Neurological: She is alert and oriented to person, place, and time.  Skin: Skin is warm and dry. No rash noted.  Psychiatric: She has a normal mood and affect. Her behavior is normal.    ED Course  Procedures   DIAGNOSTIC STUDIES: Oxygen Saturation is 97% on RA, normal by my interpretation.     COORDINATION OF CARE: 8:25 PM- Discussed plans to order CXR.  Will order pain medications. Pt advised of plan for treatment and pt agrees.  X-rays reviewed. No signs of pneumonia or other concerning causes of cough and symptoms. Symptoms sound consistent with bronchitis. Discussion and plan the patient and she agrees.  Imaging Review Dg Chest 2 View  04/05/2014   CLINICAL DATA:  Increasing short of breath, cough  EXAM: CHEST  2 VIEW  COMPARISON:  None.  FINDINGS: Normal mediastinum and cardiac silhouette. Normal pulmonary vasculature. No evidence of effusion, infiltrate, or pneumothorax. No acute bony abnormality.  IMPRESSION: Normal chest radiograph.   Electronically Signed   By: Genevive Bi M.D.   On: 04/05/2014 19:01     MDM   Final diagnoses:  Bronchitis    I personally performed the services described in this documentation, which was scribed in my presence. The recorded information has been reviewed and is accurate.     Angus Seller, PA-C 04/05/14 2052

## 2014-04-06 NOTE — ED Provider Notes (Signed)
Medical screening examination/treatment/procedure(s) were performed by non-physician practitioner and as supervising physician I was immediately available for consultation/collaboration.   EKG Interpretation None        Neythan Kozlov, MD 04/06/14 1625 

## 2014-05-22 ENCOUNTER — Emergency Department (HOSPITAL_COMMUNITY)
Admission: EM | Admit: 2014-05-22 | Discharge: 2014-05-23 | Disposition: A | Discharge status: Home / Self Care | Payer: 59 | Attending: Emergency Medicine | Admitting: Emergency Medicine

## 2014-05-22 ENCOUNTER — Encounter (HOSPITAL_COMMUNITY): Payer: Self-pay | Admitting: Emergency Medicine

## 2014-05-22 DIAGNOSIS — F17210 Nicotine dependence, cigarettes, uncomplicated: Secondary | ICD-10-CM | POA: Insufficient documentation

## 2014-05-22 DIAGNOSIS — J45909 Unspecified asthma, uncomplicated: Secondary | ICD-10-CM | POA: Diagnosis not present

## 2014-05-22 DIAGNOSIS — F17200 Nicotine dependence, unspecified, uncomplicated: Secondary | ICD-10-CM | POA: Diagnosis not present

## 2014-05-22 DIAGNOSIS — R209 Unspecified disturbances of skin sensation: Secondary | ICD-10-CM | POA: Insufficient documentation

## 2014-05-22 DIAGNOSIS — T148XXA Other injury of unspecified body region, initial encounter: Secondary | ICD-10-CM

## 2014-05-22 DIAGNOSIS — R233 Spontaneous ecchymoses: Secondary | ICD-10-CM

## 2014-05-22 DIAGNOSIS — Z792 Long term (current) use of antibiotics: Secondary | ICD-10-CM | POA: Insufficient documentation

## 2014-05-22 DIAGNOSIS — Z79899 Other long term (current) drug therapy: Secondary | ICD-10-CM | POA: Insufficient documentation

## 2014-05-22 DIAGNOSIS — M7981 Nontraumatic hematoma of soft tissue: Secondary | ICD-10-CM | POA: Diagnosis not present

## 2014-05-22 NOTE — ED Notes (Signed)
Pt states that she woke up this am and had purple knotting areas on bilateral inner thighs, no injury noted, tonight she states they are bigger and sore if you touch them.

## 2014-05-23 DIAGNOSIS — J45909 Unspecified asthma, uncomplicated: Secondary | ICD-10-CM | POA: Diagnosis not present

## 2014-05-23 DIAGNOSIS — Z79899 Other long term (current) drug therapy: Secondary | ICD-10-CM | POA: Diagnosis not present

## 2014-05-23 DIAGNOSIS — R233 Spontaneous ecchymoses: Secondary | ICD-10-CM | POA: Diagnosis not present

## 2014-05-23 DIAGNOSIS — M7981 Nontraumatic hematoma of soft tissue: Secondary | ICD-10-CM | POA: Diagnosis present

## 2014-05-23 DIAGNOSIS — R209 Unspecified disturbances of skin sensation: Secondary | ICD-10-CM | POA: Diagnosis not present

## 2014-05-23 DIAGNOSIS — F17210 Nicotine dependence, cigarettes, uncomplicated: Secondary | ICD-10-CM | POA: Diagnosis not present

## 2014-05-23 DIAGNOSIS — Z792 Long term (current) use of antibiotics: Secondary | ICD-10-CM | POA: Diagnosis not present

## 2014-05-23 LAB — CBC WITH DIFFERENTIAL/PLATELET
Basophils Absolute: 0 10*3/uL (ref 0.0–0.1)
Basophils Relative: 0 % (ref 0–1)
Eosinophils Absolute: 0.3 10*3/uL (ref 0.0–0.7)
Eosinophils Relative: 3 % (ref 0–5)
HCT: 43.4 % (ref 36.0–46.0)
HEMOGLOBIN: 15.1 g/dL — AB (ref 12.0–15.0)
LYMPHS ABS: 3.4 10*3/uL (ref 0.7–4.0)
LYMPHS PCT: 28 % (ref 12–46)
MCH: 30.8 pg (ref 26.0–34.0)
MCHC: 34.8 g/dL (ref 30.0–36.0)
MCV: 88.6 fL (ref 78.0–100.0)
MONOS PCT: 7 % (ref 3–12)
Monocytes Absolute: 0.8 10*3/uL (ref 0.1–1.0)
Neutro Abs: 7.7 10*3/uL (ref 1.7–7.7)
Neutrophils Relative %: 62 % (ref 43–77)
PLATELETS: 226 10*3/uL (ref 150–400)
RBC: 4.9 MIL/uL (ref 3.87–5.11)
RDW: 14.4 % (ref 11.5–15.5)
WBC: 12.2 10*3/uL — AB (ref 4.0–10.5)

## 2014-05-23 LAB — COMPREHENSIVE METABOLIC PANEL
ALK PHOS: 77 U/L (ref 39–117)
ALT: 16 U/L (ref 0–35)
ANION GAP: 13 (ref 5–15)
AST: 15 U/L (ref 0–37)
Albumin: 3.7 g/dL (ref 3.5–5.2)
BUN: 7 mg/dL (ref 6–23)
CHLORIDE: 101 meq/L (ref 96–112)
CO2: 24 mEq/L (ref 19–32)
Calcium: 9.2 mg/dL (ref 8.4–10.5)
Creatinine, Ser: 0.84 mg/dL (ref 0.50–1.10)
GFR calc Af Amer: >90 mL/min (ref >90)
GFR calc non Af Amer: 87 mL/min — ABNORMAL LOW (ref >90)
Glucose, Bld: 111 mg/dL — ABNORMAL HIGH (ref 70–99)
POTASSIUM: 3.5 meq/L — AB (ref 3.7–5.3)
SODIUM: 138 meq/L (ref 137–147)
TOTAL PROTEIN: 6.7 g/dL (ref 6.0–8.3)
Total Bilirubin: 0.5 mg/dL (ref 0.3–1.2)

## 2014-05-23 LAB — PROTIME-INR
INR: 1.02 (ref 0.00–1.49)
PROTHROMBIN TIME: 13.4 s (ref 11.6–15.2)

## 2014-05-23 LAB — APTT: aPTT: 25 seconds (ref 24–37)

## 2014-05-23 NOTE — ED Provider Notes (Signed)
CSN: [id number]     Arrival date & time 05/22/14  2311 History   First MD Initiated Contact with Patient 05/22/14 2327     Chief Complaint  Patient presents with  . Bleeding/Bruising     (Consider location/radiation/quality/duration/timing/severity/associated sxs/prior Treatment) HPI Comments: Pt with no known medical hx, comes in with cc of bruising. Pt reports that she woke up this AM, and noted bruising to both of her thighs, and overtime, the bruising has expanded, and there is some pain with palpation along with a nodule. There is no hx of liver dz, bleeding problems, trauma, and pt is not on any antiplatelet or anticoagulants. No hx of same in the past.  The history is provided by the patient.    Past Medical History  Diagnosis Date  . Asthma    Past Surgical History  Procedure Laterality Date  . Cholecystectomy  2008   Family History  Problem Relation Age of Onset  . Asthma Mother   . Rheum arthritis Mother   . Arthritis Mother   . Diabetes Father   . Heart disease Father   . Early death Father   . Diabetes Paternal Grandmother   . Heart disease Paternal Grandmother   . Cancer Paternal Grandmother    History  Substance Use Topics  . Smoking status: Current Every Day Smoker -- 0.50 packs/day for 8 years    Types: Cigarettes  . Smokeless tobacco: Not on file     Comment: "thinking about quitting"  . Alcohol Use: No   OB History   Grav Para Term Preterm Abortions TAB SAB Ect Mult Living                 Review of Systems  Constitutional: Negative for activity change.  HENT: Negative for dental problem and nosebleeds.   Respiratory: Negative for shortness of breath.   Cardiovascular: Negative for chest pain.  Gastrointestinal: Negative for nausea, vomiting and abdominal pain.  Genitourinary: Negative for dysuria.  Musculoskeletal: Negative for neck pain.  Skin: Positive for rash.  Neurological: Negative for headaches.  Hematological: Negative for  adenopathy. Does not bruise/bleed easily.      Allergies  Review of patient's allergies indicates no known allergies.  Home Medications   Prior to Admission medications   Medication Sig Start Date End Date Taking? Authorizing Provider  acetaminophen (TYLENOL) 500 MG tablet Take 500 mg by mouth every 6 (six) hours as needed for mild pain.   Yes Historical Provider, MD  amoxicillin-clavulanate (AUGMENTIN) 875-125 MG per tablet Take 1 tablet by mouth 2 (two) times daily.   Yes Historical Provider, MD  esomeprazole (NEXIUM) 20 MG capsule Take 20 mg by mouth daily at 12 noon.   Yes Historical Provider, MD  albuterol (PROVENTIL HFA;VENTOLIN HFA) 108 (90 BASE) MCG/ACT inhaler Inhale 2 puffs into the lungs every 4 (four) hours as needed for wheezing. 11/12/11 02/28/14  Charm Rings, MD   BP 120/66  Pulse 100  Temp(Src) 98.3 F (36.8 C) (Oral)  Resp 18  SpO2 98% Physical Exam  Nursing note and vitals reviewed. Constitutional: She appears well-developed.  Eyes: Conjunctivae and EOM are normal.  Neck: Neck supple.  Cardiovascular: Normal rate.   Pulmonary/Chest: Effort normal.  Skin:  Bilateral thigh ecchymoses, with palpable hematoma and tenderness. No bruits/thrills. The ecchymoses is about 6-8 cm bilaterally in DM. Gingiva appear normal. There is no ecchymoses over the upper extremities.    ED Course  Procedures (including critical care time) Labs Review Labs Reviewed  CBC WITH DIFFERENTIAL - Abnormal; Notable for the following:    WBC 12.2 (*)    Hemoglobin 15.1 (*)    All other components within normal limits  COMPREHENSIVE METABOLIC PANEL - Abnormal; Notable for the following:    Potassium 3.5 (*)    Glucose, Bld 111 (*)    GFR calc non Af Amer 87 (*)    All other components within normal limits  APTT  PROTIME-INR    Imaging Review No results found.   EKG Interpretation None      MDM   Final diagnoses:  Hematoma  Ecchymoses, spontaneous    Pt comes in with  cc of bruising. She has no liver dz, blood dyscrasias. There is no active bleeding, no trauma. Exam shows 2 large ecchymoses in the anterior thigh - with several tiny ones in the rest of the lower extremities. There is no hx of cancer, and the ROS is not _ for the same. PT is a heavy smoker.  The patient was counseled on the dangers of tobacco use, and was advised to quit.  Reviewed strategies to maximize success, including removing cigarettes and smoking materials from environment, stress management and substitution of other forms of reinforcement. Discussion for 2-5 minutes.  Labs are unremarkable. Heating advised, with PCP f/u requested.  \  Derwood KaplanAnkit Allyne Hebert, MD 05/23/14 [phone]

## 2014-05-23 NOTE — Discharge Instructions (Signed)
RESOURCE GUIDE  Chronic Pain Problems: Contact [facility]  [phone] Patients need to be referred by their primary care doctor.  Insufficient Money for Medicine: Contact [employer]:  call "211."   No Primary Care Doctor: - Call Health Connect  [phone] - can help you locate a primary care doctor that  accepts your insurance, provides certain services, etc. - Physician Referral Service- [phone]  Agencies that provide inexpensive medical care: - [facility]  [facility]  [phone] - [facility]  [phone] - [facility]  [phone] - Planned [facility]  [facility]  [phone]  Silex Providers: - [facility]- [address], [address]  [phone], Mon-Fri 9am-7pm, Sat 9am-1pm - [facility] [place], [address] [place]  [address], [address] [place]  [place]- [address]  [place], [address], [phone]  Only accepts [place] patients after they have their name  applied to their card  Self Pay (no insurance) in [place]: - Sickle Cell Patients: Dr Kevan Ny, [facility]  [place], [facility]- [place]  [facility]- [id number] [place], [facility]- see information above (Speak to [employer] if you do not have insurance)       -  [facility]- [place],  [place], [place]  Dr Vista Lawman-  [address], [place], [place], [place]       -  Urgent Medical and [facility] - [address] [phone]       -  [facility]- [address], [place],  also [address], [phone]       -    [facility]- [place], [place], 1st & 3rd Saturday        every month, 10am-1pm  [facility] [place], [place] [address] [phone]  [facility] [place]. [place], [place] [address] [phone]  1) Find a Doctor and Pay Out of Pocket Although you won't have to find out who is covered by your insurance plan, it is a good idea to ask around and get recommendations. You will then need to call the office and see if the doctor you have chosen will accept you as a new patient and what types of options they offer for patients who are self-pay. Some doctors offer discounts or will set up payment plans for their patients who do not have insurance, but you will need to ask so you aren't surprised when you get to your appointment.  2) Contact Your Local Health Department Not all health departments have doctors that can see patients for sick visits, but many do, so it is worth a call to see if yours does. If you don't know where your local health department is, you can check in your phone book. The CDC also has a tool  to help you locate your state's health department, and many state websites also have listings of all of their local health departments.  3) Find a Walk-in Clinic If your illness is not likely to be very severe or complicated, you may want to try a walk in clinic. These are popping up all over the country in pharmacies, drugstores, and shopping centers. They're usually staffed by nurse practitioners or physician assistants that have been trained to treat common illnesses and complaints. They're usually fairly quick and inexpensive. However, if you have serious medical issues or chronic medical problems, these are probably not your best option  STD Testing - [facility] [place], STD Clinic, [address], [place],  phone [phone] or [phone].  Monday - Friday, call for an appointment. [facility] [place] [facility] [place], STD Clinic, [place], [place], phone [phone] or [phone].  Monday - Friday, call for an appointment.  Abuse/Neglect: [facility] [place] Child Abuse Hotline [phone]) [phone] [facility] [place] Child Abuse Hotline (616)267-1808916 784 5422 (After Hours)  Emergency Shelter:  [facility] [phone]) [phone]  Maternity Homes: - [facility] [place] [facility] [phone]) [phone] - [facility] [phone]  MRSA Hotline #:   330-883-72718657680370  Dental Assistance If unable to pay or uninsured, contact:  [facility] to become qualified for the adult dental clinic.  Patients with Medicaid: [facility] [phone], [phone] [address], [phone]  If unable to pay, or uninsured, contact [facility] [phone] in [place], [phone] in [facility]) to become qualified for the adult dental clinic  [facility] [address] [place], [place] [id number] [phone] [email or web address]  Other [facility] Community Dental Services: - [facility]- [address], [place], [place], [id number], [phone], [phone], 2nd and 4th Thursday of the month at 6:30am.  10 clients each day by appointment, can sometimes see walk-in patients if someone does not show for an appointment. [facility]- [address], [place], [place], [phone], [phone] - [facility]- [address], [place], [place], [address], [phone] [facility]- [phone] [facility]- [phone] [facility]- [place] [phone]          We saw you in the ER for the bruising. All the results in the ER are normal. We are not sure what is causing your symptoms. The workup in the ER is not complete, and is limited to  screening for life threatening and emergent conditions only, so please see a primary care doctor for further evaluation.   Hematoma A hematoma is a collection of blood under the skin, in an organ, in a body space, in a joint space, or in other tissue. The blood can clot to form a lump that you can see and feel. The lump is often firm and may sometimes become sore and tender. Most hematomas get better in a few days to weeks. However, some hematomas may be serious and require medical care. Hematomas can range in size from very small to very large. CAUSES  A hematoma can be caused by a blunt or penetrating injury. It can also be caused by spontaneous leakage from a blood vessel under the skin. Spontaneous leakage from a blood vessel is more likely to occur in older people, especially those taking blood thinners. Sometimes, a hematoma can develop after certain medical procedures.  SIGNS AND SYMPTOMS   A firm lump on the body.  Possible pain and tenderness in the area.  Bruising.Blue, dark blue, purple-red, or yellowish skin may appear at the site of the hematoma if the hematoma is close to the surface of the skin. For hematomas in deeper tissues or body spaces, the signs and symptoms may be subtle. For example, an intra-abdominal hematoma may cause abdominal pain, weakness, fainting, and shortness of breath. An intracranial hematoma may cause a headache or symptoms such as weakness, trouble speaking, or a change in consciousness. DIAGNOSIS  A hematoma can usually be diagnosed based on your medical history and a physical exam. Imaging tests may be needed if your health care provider suspects a hematoma in deeper tissues or body spaces, such as the abdomen, head, or chest. These tests may include ultrasonography or a CT scan.  TREATMENT  Hematomas usually go away on their own over time. Rarely does the blood need to be drained out of the body. Large hematomas or those that may affect vital organs will  sometimes need surgical drainage or monitoring. HOME CARE INSTRUCTIONS   Apply ice to the injured area:   Put ice in a plastic bag.   Place a towel between your skin and the bag.   Leave the ice on for 20 minutes, 2-3 times a day for the first 1 to 2 days.   After the first 2 days, switch to using warm compresses on the hematoma.   Elevate the injured area to help decrease pain and swelling. Wrapping the area with an elastic bandage may also be helpful. Compression helps to reduce swelling and promotes shrinking of the hematoma. Make sure the bandage is not wrapped too tight.   If your hematoma is on a lower extremity and is painful, crutches may be helpful for a couple days.   Only take over-the-counter or prescription medicines as directed by your health care provider. SEEK IMMEDIATE MEDICAL CARE IF:   You have increasing pain, or your pain is not controlled with medicine.   You have a fever.   You have worsening swelling or discoloration.   Your skin over the hematoma breaks or starts bleeding.   Your hematoma is in your chest or abdomen and you have weakness, shortness of breath, or a change in consciousness.  Your hematoma is on your scalp (caused by a fall or injury) and you have a worsening headache or a change in alertness or consciousness. MAKE SURE YOU:   Understand these instructions.  Will watch your condition.  Will get help right away if you are not doing well or get worse. Document Released: 03/23/2004 Document Revised: 04/11/2013 Document Reviewed: 01/17/2013 [facility] Patient Information 2015 [place], [place]. This information is not intended to replace advice given to you by your health care provider. Make sure you discuss any questions you have with your health care provider.

## 2014-08-30 ENCOUNTER — Ambulatory Visit (INDEPENDENT_AMBULATORY_CARE_PROVIDER_SITE_OTHER): Payer: 59 | Admitting: Family Medicine

## 2014-08-30 ENCOUNTER — Encounter: Payer: Self-pay | Admitting: Family Medicine

## 2014-08-30 VITALS — BP 139/82 | HR 96 | Temp 98.1°F | Ht 64.25 in | Wt 189.0 lb

## 2014-08-30 DIAGNOSIS — H65191 Other acute nonsuppurative otitis media, right ear: Secondary | ICD-10-CM | POA: Insufficient documentation

## 2014-08-30 DIAGNOSIS — R300 Dysuria: Secondary | ICD-10-CM

## 2014-08-30 DIAGNOSIS — J440 Chronic obstructive pulmonary disease with acute lower respiratory infection: Secondary | ICD-10-CM | POA: Insufficient documentation

## 2014-08-30 LAB — POCT URINALYSIS DIPSTICK
BILIRUBIN UA: NEGATIVE
Blood, UA: NEGATIVE
GLUCOSE UA: NEGATIVE
Ketones, UA: NEGATIVE
NITRITE UA: NEGATIVE
Protein, UA: NEGATIVE
SPEC GRAV UA: 1.025
Urobilinogen, UA: 0.2
pH, UA: 5.5

## 2014-08-30 LAB — POCT UA - MICROSCOPIC ONLY

## 2014-08-30 MED ORDER — BENZONATATE 100 MG PO CAPS: 100.0000 mg | ORAL_CAPSULE | Freq: Two times a day (BID) | ORAL | Status: DC | PRN

## 2014-08-30 MED ORDER — ALBUTEROL SULFATE HFA 108 (90 BASE) MCG/ACT IN AERS: 2.0000 | INHALATION_SPRAY | RESPIRATORY_TRACT | Status: DC | PRN

## 2014-08-30 MED ORDER — PREDNISONE 50 MG PO TABS: 50.0000 mg | ORAL_TABLET | Freq: Every day | ORAL | Status: DC

## 2014-08-30 MED ORDER — FLUTICASONE PROPIONATE 50 MCG/ACT NA SUSP: 2.0000 | Freq: Every day | NASAL | Status: DC

## 2014-08-30 MED ORDER — LEVOFLOXACIN 500 MG PO TABS: 500.0000 mg | ORAL_TABLET | Freq: Every day | ORAL | Status: DC

## 2014-08-30 NOTE — Patient Instructions (Signed)
Dear Monica PaganAngie Sandoval, Thank you for coming in to clinic today.  1. I think that this is a Bronchitis flare up - may be caused by Chronic Obstructive Pulmonary Disease (COPD) from smoking. We will need to check your breathing status in the future at our clinic. 2. Take Levaquin antibiotic 500mg  daily for 7 days. 3. Take Prednisone 50mg  daily for 5 days with breakfast 4. Use Albuterol inhaler 2 puffs every 4 hours for the next 3 to 5 days (use it every 4 hours) then after 3-5 days you may return to only as needed 5. Sent urine for culture - we will notify you if need to change antibiotics 6. Flonase nasal spray - use 2 sprays in each nostril daily for 1 month, then may use as needed  Please schedule a follow-up appointment with Dr. Beryle FlockBacigalupo in 2 weeks to follow-up after this treatment to see if you are improving. May need to refer you to ENT for check on your Right ear.  If you have any other questions or concerns, please feel free to call the clinic to contact me. You may also schedule an earlier appointment if necessary.  However, if your symptoms get significantly worse, please go to the Emergency Department to seek immediate medical attention.  Saralyn PilarAlexander Amilyah Nack, DO [facility]

## 2014-08-30 NOTE — Progress Notes (Signed)
   Subjective:    Patient ID: Janit PaganAngie Levario, female    DOB: 02/02/1976, 39 y.o.   MRN: [id number]  Patient presents for a same day appointment.  HPI  URI / COUGH / OTALGIA: - Reports recent history since September 2015 with multiple illnesses and sick family contacts with intermittent improvement (up to 1-2 weeks at a time) followed by return and worsening symptoms. States her husband and daughter dx with Strep throat in October. Patient admits to intermittent sore throat since that time, and now since December she has been increasingly hoarse (with waxing and waning improvement). Other symptoms include recurrent productive cough, Right ear fullness and pain. She has been seen by Urgent Care and [facility] ER 2-3x during the past 3-4 months, previously dx with bronchitis twice, and "fluid in Right ear", completed 2 courses of antibiotics with Augmentin x 10 days and Azithromycin. - Currently today complains of worsening symptoms for past 3 days, productive cough, Right ear pain and "pressure", hoarse voice (without sore throat). Other symptoms mostly improved. - Active smoker but no prior dx COPD - Denies any fevers/chills, muscle / joint pain, HAs, sinus pressure, nausea, vomiting,   DYSURIA: - Reports dysuria with every urination for past 3 days as well. No recent UTIs. - Denies any hematuria, urinary odor, vaginal discharge, abdominal or flank pain  I have reviewed and updated the following as appropriate: allergies and current medications  Social Hx: - Active smoker  Review of Systems  See above HPI    Objective:   Physical Exam  BP 139/82 mmHg  Pulse 96  Temp(Src) 98.1 F (36.7 C) (Oral)  Ht 5' 4.25" (1.632 m)  Wt 189 lb (85.73 kg)  BMI 32.19 kg/m2  Gen - well-appearing, NAD HEENT - NCAT, sinuses non-tender, PERRL, EOMI, R-TM with serous vs suppurative fluid without erythema or bulging, otherwise nml light reflex and landmarks. Left-TM normal.  b/l edematous nasal turbinates w/o  congestion, oropharynx clear with mild erythema, no edema or exudates, symmetrical, MMM Neck - supple, b/l anterior cervical tenderness with +LAD Heart - mild tachycardia, regular rhtythm, no murmurs heard Lungs - Bilateral mild coarse breath sounds with rhonchi. No overt high pitched wheezing. No focal crackles. Normal work of breathing. Speaks full sentences Skin - warm, dry, no rashes     Assessment & Plan:   See specific A&P problem list for details.

## 2014-08-31 DIAGNOSIS — R300 Dysuria: Secondary | ICD-10-CM | POA: Insufficient documentation

## 2014-08-31 MED ORDER — FLUCONAZOLE 150 MG PO TABS: 150.0000 mg | ORAL_TABLET | Freq: Once | ORAL | Status: DC

## 2014-08-31 NOTE — Assessment & Plan Note (Signed)
Right ear with serous vs suppurative effusion without signs of infection (no erythema, bulging, or loss of landmarks). Recent x 2 courses of antibiotics (Augmentin, Azithro) within past few months, unclear if R-ear previously infected with AoM at that time. Suspect current effusion due to sinus edema, pressure from chronic URI/bronchitis.  Plan: 1. Rx Flonase 2 sprays b/l nares x 2-4 weeks, may continue PRN 2. Coverage with Levaquin abx 3. RTC if no improvement 1-2 weeks - if persistent or worsening effusion, may consider ENT referral for re-evaluation

## 2014-08-31 NOTE — Assessment & Plan Note (Addendum)
Worsening productive cough, with recurrent bronchitis flares over past 3-4 months, in chronic smoker, w/o prior dx COPD. Consistent with possible acute bronchitis / mild COPD exacerbation given persistence of symptoms without resolution. - afebrile, improved with albuterol at home (using regularly), lungs with mild coarse sounds w/o overt wheeze, no resp distress with good air movement  Plan: 1. Treat as COPD exac with Levaquin 500mg  daily x 7 days, Prednisone 50mg  daily x 5 days 2. Refilled Albuterol - recommend 2 puffs q 4 hours scheduled x 3-5 days, then PRN 3. Rx Benzonatate PRN cough 4. RTC 1-2 weeks if no improvement or sooner if worsening, return precautions given.  Future - patient may benefit from PFTs in Pharmacy Clinic and/or trial on maintenance ICS/LABA for COPD. IF continues with regular exacerbations.

## 2014-08-31 NOTE — Assessment & Plan Note (Signed)
Dysuria x 3 days. UA with WBCs and yeast, suggestive of UTI vs yeast infection as etiology for dysuria.  Plan: 1. UA - neg nitrite, trace leuks. Micro with +few yeast, +WBC 20-30 2. Considered Urine Culture - not collected due to miscommunication with lab. 3. Possible UTI covered with Levaquin abx for bronchitis/COPD exac 4. Rx Diflucan for yeast in urine 5. RTC if not improved 1-2 weeks, would recommend repeat UA and urine culture at that time if persistent dysuria

## 2014-09-13 ENCOUNTER — Ambulatory Visit: Payer: 59 | Admitting: Family Medicine

## 2014-12-09 ENCOUNTER — Encounter (HOSPITAL_COMMUNITY): Payer: Self-pay | Admitting: Emergency Medicine

## 2014-12-09 ENCOUNTER — Emergency Department (HOSPITAL_COMMUNITY)
Admission: EM | Admit: 2014-12-09 | Discharge: 2014-12-09 | Disposition: A | Discharge status: Home / Self Care | Payer: 59 | Attending: Emergency Medicine | Admitting: Emergency Medicine

## 2014-12-09 ENCOUNTER — Emergency Department (HOSPITAL_COMMUNITY): Payer: 59

## 2014-12-09 DIAGNOSIS — Z7951 Long term (current) use of inhaled steroids: Secondary | ICD-10-CM | POA: Insufficient documentation

## 2014-12-09 DIAGNOSIS — Z720 Tobacco use: Secondary | ICD-10-CM | POA: Diagnosis not present

## 2014-12-09 DIAGNOSIS — Z79899 Other long term (current) drug therapy: Secondary | ICD-10-CM | POA: Insufficient documentation

## 2014-12-09 DIAGNOSIS — Z7952 Long term (current) use of systemic steroids: Secondary | ICD-10-CM | POA: Insufficient documentation

## 2014-12-09 DIAGNOSIS — R0981 Nasal congestion: Secondary | ICD-10-CM

## 2014-12-09 DIAGNOSIS — R059 Cough, unspecified: Secondary | ICD-10-CM

## 2014-12-09 DIAGNOSIS — Z792 Long term (current) use of antibiotics: Secondary | ICD-10-CM | POA: Diagnosis not present

## 2014-12-09 DIAGNOSIS — J45901 Unspecified asthma with (acute) exacerbation: Secondary | ICD-10-CM | POA: Diagnosis not present

## 2014-12-09 DIAGNOSIS — R05 Cough: Secondary | ICD-10-CM

## 2014-12-09 DIAGNOSIS — J45909 Unspecified asthma, uncomplicated: Secondary | ICD-10-CM | POA: Diagnosis present

## 2014-12-09 MED ORDER — IPRATROPIUM-ALBUTEROL 0.5-2.5 (3) MG/3ML IN SOLN
3.0000 mL | Freq: Once | RESPIRATORY_TRACT | Status: AC
  Administered 2014-12-09: 3 mL via RESPIRATORY_TRACT
  Filled 2014-12-09: qty 3

## 2014-12-09 MED ORDER — ALBUTEROL SULFATE (2.5 MG/3ML) 0.083% IN NEBU
5.0000 mg | INHALATION_SOLUTION | Freq: Once | RESPIRATORY_TRACT | Status: DC
  Filled 2014-12-09: qty 6

## 2014-12-09 MED ORDER — IPRATROPIUM BROMIDE 0.02 % IN SOLN
0.5000 mg | Freq: Once | RESPIRATORY_TRACT | Status: AC
  Administered 2014-12-09: 0.5 mg via RESPIRATORY_TRACT
  Filled 2014-12-09: qty 2.5

## 2014-12-09 MED ORDER — PREDNISONE 20 MG PO TABS: 40.0000 mg | ORAL_TABLET | Freq: Every day | ORAL | Status: DC

## 2014-12-09 MED ORDER — AZITHROMYCIN 250 MG PO TABS: 250.0000 mg | ORAL_TABLET | Freq: Every day | ORAL | Status: DC

## 2014-12-09 MED ORDER — ALBUTEROL SULFATE (2.5 MG/3ML) 0.083% IN NEBU
5.0000 mg | INHALATION_SOLUTION | Freq: Once | RESPIRATORY_TRACT | Status: AC
Start: 2014-12-09 — End: 2014-12-09
  Administered 2014-12-09: 5 mg via RESPIRATORY_TRACT
  Filled 2014-12-09: qty 6

## 2014-12-09 MED ORDER — ALBUTEROL SULFATE HFA 108 (90 BASE) MCG/ACT IN AERS
2.0000 | INHALATION_SPRAY | RESPIRATORY_TRACT | Status: DC | PRN
  Administered 2014-12-09: 2 via RESPIRATORY_TRACT
  Filled 2014-12-09: qty 6.7

## 2014-12-09 MED ORDER — PREDNISONE 20 MG PO TABS
60.0000 mg | ORAL_TABLET | Freq: Once | ORAL | Status: AC
  Administered 2014-12-09: 60 mg via ORAL
  Filled 2014-12-09: qty 3

## 2014-12-09 NOTE — ED Provider Notes (Signed)
CSN: [id number]     Arrival date & time 12/09/14  1604 History  This chart was scribed for Roxy Horsemanobert Bruchy Mikel, PA-C, working with Pricilla LovelessScott Goldston, MD by Jolene Provostobert Halas, ED Scribe. This patient was seen in room WTR9/WTR9 and the patient's care was started at 5:00 PM.    Chief Complaint  Patient presents with  . Asthma  . Chills    HPI  HPI Comments: Monica Sandoval is a 39 y.o. female with a hx of asthma who presents to the Emergency Department complaining of difficulty breathing for the last five days. Pt states her sx started with a sore throat and mild SOB, and have become progressively worse for the last five days. Pt states her inhaler is no longer providing relief. Pt endorses associated postnasal drip, sinus pressure, fever and chills. Pt states she has had bronchitis in the past. Pt denies hx of COPD, DM, or heart disease. Pt states her sx make it difficult for her to speak. Pt states her back hurts secondary to coughing.    Past Medical History  Diagnosis Date  . Asthma    Past Surgical History  Procedure Laterality Date  . Cholecystectomy  2008   Family History  Problem Relation Age of Onset  . Asthma Mother   . Rheum arthritis Mother   . Arthritis Mother   . Diabetes Father   . Heart disease Father   . Early death Father   . Diabetes Paternal Grandmother   . Heart disease Paternal Grandmother   . Cancer Paternal Grandmother    History  Substance Use Topics  . Smoking status: Current Every Day Smoker -- 0.50 packs/day for 8 years    Types: Cigarettes  . Smokeless tobacco: Not on file     Comment: "thinking about quitting"  . Alcohol Use: No   OB History    No data available     Review of Systems  Constitutional: Positive for fever and chills.  HENT: Positive for congestion, postnasal drip, sore throat and voice change.   Respiratory: Positive for cough, shortness of breath and wheezing.   Musculoskeletal: Positive for back pain.    Allergies  Review of patient's  allergies indicates no known allergies.  Home Medications   Prior to Admission medications   Medication Sig Start Date End Date Taking? Authorizing Provider  acetaminophen (TYLENOL) 500 MG tablet Take 500 mg by mouth every 6 (six) hours as needed for mild pain.    Historical Provider, MD  albuterol (PROVENTIL HFA;VENTOLIN HFA) 108 (90 BASE) MCG/ACT inhaler Inhale 2 puffs into the lungs every 4 (four) hours as needed for wheezing. 08/30/14 12/16/16  Netta NeatAlexander J Karamalegos, DO  benzonatate (TESSALON) 100 MG capsule Take 1 capsule (100 mg total) by mouth 2 (two) times daily as needed for cough. 08/30/14   Smitty CordsAlexander J Karamalegos, DO  esomeprazole (NEXIUM) 20 MG capsule Take 20 mg by mouth daily at 12 noon.    Historical Provider, MD  fluconazole (DIFLUCAN) 150 MG tablet Take 1 tablet (150 mg total) by mouth once. Take 2nd tablet 3 days later. 08/31/14   Smitty CordsAlexander J Karamalegos, DO  fluticasone (FLONASE) 50 MCG/ACT nasal spray Place 2 sprays into both nostrils daily. 08/30/14   Smitty CordsAlexander J Karamalegos, DO  levofloxacin (LEVAQUIN) 500 MG tablet Take 1 tablet (500 mg total) by mouth daily. 08/30/14   Smitty CordsAlexander J Karamalegos, DO  predniSONE (DELTASONE) 50 MG tablet Take 1 tablet (50 mg total) by mouth daily with breakfast. For 5 days 08/30/14  Alexander J Karamalegos, DO   BP 104/61 mmHg  Pulse 89  Temp(Src) 97.7 F (36.5 C) (Oral)  Resp 18  SpO2 98% Physical Exam  Constitutional: She is oriented to person, place, and time. She appears well-developed and well-nourished. No distress.  HENT:  Head: Normocephalic and atraumatic.  Right Ear: External ear normal.  Left Ear: External ear normal.  Mouth/Throat: Oropharynx is clear and moist. No oropharyngeal exudate.  Eyes: Pupils are equal, round, and reactive to light.  Neck: Neck supple.  Cardiovascular: Normal rate, regular rhythm and normal heart sounds.  Exam reveals no gallop and no friction rub.   No murmur heard. Pulmonary/Chest: She has wheezes.   Scattered wheezes  Abdominal: Soft. She exhibits no distension.  Musculoskeletal: Normal range of motion.  Neurological: She is alert and oriented to person, place, and time. Coordination normal.  Skin: Skin is warm and dry. She is not diaphoretic.  Psychiatric: She has a normal mood and affect. Her behavior is normal.  Nursing note and vitals reviewed.   ED Course  Procedures  DIAGNOSTIC STUDIES: Oxygen Saturation is 98% on RA, normal by my interpretation.    COORDINATION OF CARE: 5:06 PM Discussed treatment plan with pt at bedside and pt agreed to plan.  Labs Review Labs Reviewed - No data to display  Imaging Review Dg Chest 2 View (if Patient Has Fever And/or Copd)  12/09/2014   CLINICAL DATA:  Short of breath, 5 days duration.  Cough.  EXAM: CHEST  2 VIEW  COMPARISON:  04/05/2014  FINDINGS: Heart size is normal. Mediastinal shadows are normal. There is mild central bronchial thickening but there is no infiltrate, collapse or effusion. No significant bony finding.  IMPRESSION: Bronchitis.  No consolidation or collapse.   Electronically Signed   By: Paulina Fusi M.D.   On: 12/09/2014 17:26     EKG Interpretation None      MDM   Final diagnoses:  Cough  Asthma exacerbation  Sinus congestion    Pt CXR negative for acute infiltrate. Patients symptoms are consistent with URI, likely viral etiology. Discussed that antibiotics are not indicated for viral infections. Pt will be discharged with symptomatic treatment.  Verbalizes understanding and is agreeable with plan. Pt is hemodynamically stable & in NAD prior to dc. Patient ambulated in ED with O2 saturations maintained >90, no current signs of respiratory distress. Lung exam improved after nebulizer treatment. Prednisone given in the ED and pt will bd dc with 5 day burst. Pt states they are breathing at baseline. Pt has been instructed to continue using prescribed medications and to speak with PCP about today's exacerbation.    I personally performed the services described in this documentation, which was scribed in my presence. The recorded information has been reviewed and is accurate.    Roxy Horseman, PA-C 12/09/14 1829  Pricilla Loveless, MD 12/14/14 [phone]

## 2014-12-09 NOTE — ED Notes (Signed)
RT at bedside.

## 2014-12-09 NOTE — ED Notes (Signed)
RT notified re: neb tx.

## 2014-12-09 NOTE — ED Notes (Signed)
Pt ambulated without assist.  sats 93-94% RA.  RoB EDPA aware

## 2014-12-09 NOTE — Discharge Instructions (Signed)
Asthma, Acute Bronchospasm °Acute bronchospasm caused by asthma is also referred to as an asthma attack. Bronchospasm means your air passages become narrowed. The narrowing is caused by inflammation and tightening of the muscles in the air tubes (bronchi) in your lungs. This can make it hard to breathe or cause you to wheeze and cough. °CAUSES °Possible triggers are: °· Animal dander from the skin, hair, or feathers of animals. °· Dust mites contained in house dust. °· Cockroaches. °· Pollen from trees or grass. °· Mold. °· Cigarette or tobacco smoke. °· Air pollutants such as dust, household cleaners, hair sprays, aerosol sprays, paint fumes, strong chemicals, or strong odors. °· Cold air or weather changes. Cold air may trigger inflammation. Winds increase molds and pollens in the air. °· Strong emotions such as crying or laughing hard. °· Stress. °· Certain medicines such as aspirin or beta-blockers. °· Sulfites in foods and drinks, such as dried fruits and wine. °· Infections or inflammatory conditions, such as a flu, cold, or inflammation of the nasal membranes (rhinitis). °· Gastroesophageal reflux disease (GERD). GERD is a condition where stomach acid backs up into your esophagus. °· Exercise or strenuous activity. °SIGNS AND SYMPTOMS  °· Wheezing. °· Excessive coughing, particularly at night. °· Chest tightness. °· Shortness of breath. °DIAGNOSIS  °Your health care provider will ask you about your medical history and perform a physical exam. A chest X-ray or blood testing may be performed to look for other causes of your symptoms or other conditions that may have triggered your asthma attack.  °TREATMENT  °Treatment is aimed at reducing inflammation and opening up the airways in your lungs.  Most asthma attacks are treated with inhaled medicines. These include quick relief or rescue medicines (such as bronchodilators) and controller medicines (such as inhaled corticosteroids). These medicines are sometimes  given through an inhaler or a nebulizer. Systemic steroid medicine taken by mouth or given through an IV tube also can be used to reduce the inflammation when an attack is moderate or severe. Antibiotic medicines are only used if a bacterial infection is present.  °HOME CARE INSTRUCTIONS  °· Rest. °· Drink plenty of liquids. This helps the mucus to remain thin and be easily coughed up. Only use caffeine in moderation and do not use alcohol until you have recovered from your illness. °· Do not smoke. Avoid being exposed to secondhand smoke. °· You play a critical role in keeping yourself in good health. Avoid exposure to things that cause you to wheeze or to have breathing problems. °· Keep your medicines up-to-date and available. Carefully follow your health care provider's treatment plan. °· Take your medicine exactly as prescribed. °· When pollen or pollution is bad, keep windows closed and use an air conditioner or go to places with air conditioning. °· Asthma requires careful medical care. See your health care provider for a follow-up as advised. If you are more than [redacted] weeks pregnant and you were prescribed any new medicines, let your obstetrician know about the visit and how you are doing. Follow up with your health care provider as directed. °· After you have recovered from your asthma attack, make an appointment with your outpatient doctor to talk about ways to reduce the likelihood of future attacks. If you do not have a doctor who manages your asthma, make an appointment with a primary care doctor to discuss your asthma. °SEEK IMMEDIATE MEDICAL CARE IF:  °· You are getting worse. °· You have trouble breathing. If severe, call your local   emergency services (911 in the [place] °· You develop chest pain or discomfort. °· You are vomiting. °· You are not able to keep fluids down. °· You are coughing up yellow, green, brown, or bloody sputum. °· You have a fever and your symptoms suddenly get worse. °· You have  trouble swallowing. °MAKE SURE YOU:  °· Understand these instructions. °· Will watch your condition. °· Will get help right away if you are not doing well or get worse. °Document Released: 11/24/2006 Document Revised: 08/14/2013 Document Reviewed: 02/14/2013 °ExitCare® Patient Information ©2015 [employer]. This information is not intended to replace advice given to you by your health care provider. Make sure you discuss any questions you have with your health care provider. ° °Upper Respiratory Infection, Adult °An upper respiratory infection (URI) is also sometimes known as the common cold. The upper respiratory tract includes the nose, sinuses, throat, trachea, and bronchi. Bronchi are the airways leading to the lungs. Most people improve within 1 week, but symptoms can last up to 2 weeks. A residual cough may last even longer.  °CAUSES °Many different viruses can infect the tissues lining the upper respiratory tract. The tissues become irritated and inflamed and often become very moist. Mucus production is also common. A cold is contagious. You can easily spread the virus to others by oral contact. This includes kissing, sharing a glass, coughing, or sneezing. Touching your mouth or nose and then touching a surface, which is then touched by another person, can also spread the virus. °SYMPTOMS  °Symptoms typically develop 1 to 3 days after you come in contact with a cold virus. Symptoms vary from person to person. They may include: °· Runny nose. °· Sneezing. °· Nasal congestion. °· Sinus irritation. °· Sore throat. °· Loss of voice (laryngitis). °· Cough. °· Fatigue. °· Muscle aches. °· Loss of appetite. °· Headache. °· Low-grade fever. °DIAGNOSIS  °You might diagnose your own cold based on familiar symptoms, since most people get a cold 2 to 3 times a year. Your caregiver can confirm this based on your exam. Most importantly, your caregiver can check that your symptoms are not due to another disease such as  strep throat, sinusitis, pneumonia, asthma, or epiglottitis. Blood tests, throat tests, and X-rays are not necessary to diagnose a common cold, but they may sometimes be helpful in excluding other more serious diseases. Your caregiver will decide if any further tests are required. °RISKS AND COMPLICATIONS  °You may be at risk for a more severe case of the common cold if you smoke cigarettes, have chronic heart disease (such as heart failure) or lung disease (such as asthma), or if you have a weakened immune system. The very young and very old are also at risk for more serious infections. Bacterial sinusitis, middle ear infections, and bacterial pneumonia can complicate the common cold. The common cold can worsen asthma and chronic obstructive pulmonary disease (COPD). Sometimes, these complications can require emergency medical care and may be life-threatening. °PREVENTION  °The best way to protect against getting a cold is to practice good hygiene. Avoid oral or hand contact with people with cold symptoms. Wash your hands often if contact occurs. There is no clear evidence that vitamin C, vitamin E, echinacea, or exercise reduces the chance of developing a cold. However, it is always recommended to get plenty of rest and practice good nutrition. °TREATMENT  °Treatment is directed at relieving symptoms. There is no cure. Antibiotics are not effective, because the infection is caused   by a virus, not by bacteria. Treatment may include: °· Increased fluid intake. Sports drinks offer valuable electrolytes, sugars, and fluids. °· Breathing heated mist or steam (vaporizer or shower). °· Eating chicken soup or other clear broths, and maintaining good nutrition. °· Getting plenty of rest. °· Using gargles or lozenges for comfort. °· Controlling fevers with ibuprofen or acetaminophen as directed by your caregiver. °· Increasing usage of your inhaler if you have asthma. °Zinc gel and zinc lozenges, taken in the first 24 hours  of the common cold, can shorten the duration and lessen the severity of symptoms. Pain medicines may help with fever, muscle aches, and throat pain. A variety of non-prescription medicines are available to treat congestion and runny nose. Your caregiver can make recommendations and may suggest nasal or lung inhalers for other symptoms.  °HOME CARE INSTRUCTIONS  °· Only take over-the-counter or prescription medicines for pain, discomfort, or fever as directed by your caregiver. °· Use a warm mist humidifier or inhale steam from a shower to increase air moisture. This may keep secretions moist and make it easier to breathe. °· Drink enough water and fluids to keep your urine clear or pale yellow. °· Rest as needed. °· Return to work when your temperature has returned to normal or as your caregiver advises. You may need to stay home longer to avoid infecting others. You can also use a face mask and careful hand washing to prevent spread of the virus. °SEEK MEDICAL CARE IF:  °· After the first few days, you feel you are getting worse rather than better. °· You need your caregiver's advice about medicines to control symptoms. °· You develop chills, worsening shortness of breath, or brown or red sputum. These may be signs of pneumonia. °· You develop yellow or brown nasal discharge or pain in the face, especially when you bend forward. These may be signs of sinusitis. °· You develop a fever, swollen neck glands, pain with swallowing, or white areas in the back of your throat. These may be signs of strep throat. °SEEK IMMEDIATE MEDICAL CARE IF:  °· You have a fever. °· You develop severe or persistent headache, ear pain, sinus pain, or chest pain. °· You develop wheezing, a prolonged cough, cough up blood, or have a change in your usual mucus (if you have chronic lung disease). °· You develop sore muscles or a stiff neck. °Document Released: 02/02/2001 Document Revised: 11/01/2011 Document Reviewed: 11/14/2013 °ExitCare®  Patient Information ©2015 [employer]. This information is not intended to replace advice given to you by your health care provider. Make sure you discuss any questions you have with your health care provider. ° °

## 2014-12-09 NOTE — ED Notes (Signed)
Pt's family reports pt has been coughing, SOB where she cannot even talk.  Pt's family member is talking for her.  Reports this has been going on since Wednesday.  Pt has hx of asthma.  Family reports cp which is not new and back pain.  Pt is A&Ox 4, in NAD.  Family also reports pt has coughing fit every 3 hours where she would cough so hard that she vomits.

## 2014-12-09 NOTE — ED Notes (Signed)
Pt c/o SOB, chills, back pain onset 7 days ago, and chronic chest pain.

## 2015-04-16 ENCOUNTER — Ambulatory Visit (INDEPENDENT_AMBULATORY_CARE_PROVIDER_SITE_OTHER): Payer: 59 | Admitting: Family Medicine

## 2015-04-16 ENCOUNTER — Encounter: Payer: Self-pay | Admitting: Family Medicine

## 2015-04-16 VITALS — BP 107/70 | HR 79 | Temp 98.3°F | Ht 64.5 in | Wt 188.4 lb

## 2015-04-16 DIAGNOSIS — H9191 Unspecified hearing loss, right ear: Secondary | ICD-10-CM | POA: Diagnosis not present

## 2015-04-16 DIAGNOSIS — R062 Wheezing: Secondary | ICD-10-CM

## 2015-04-16 DIAGNOSIS — M25571 Pain in right ankle and joints of right foot: Secondary | ICD-10-CM

## 2015-04-17 DIAGNOSIS — R062 Wheezing: Secondary | ICD-10-CM | POA: Insufficient documentation

## 2015-04-17 DIAGNOSIS — M25571 Pain in right ankle and joints of right foot: Secondary | ICD-10-CM | POA: Insufficient documentation

## 2015-04-17 NOTE — Patient Instructions (Signed)
Written handout given to patient as EPIC down

## 2015-04-17 NOTE — Assessment & Plan Note (Signed)
Likely COPD with smoking history Continue albuterol when necessary Patient to return to clinic for PFTs with Dr. Raymondo Band

## 2015-04-17 NOTE — Assessment & Plan Note (Signed)
No abnormalities noted on internal ear exam Can use Flonase daily for eustachian tube dysfunction Referral to audiology for formal hearing evaluation

## 2015-04-17 NOTE — Progress Notes (Signed)
   Subjective:   Monica Sandoval is a 39 y.o. female with a history of COPD versus asthma here for right leg and foot pain and decreased hearing in right ear  Right leg and foot pain - Patient reports that it feels like a toothache in the bone in her leg - She has had sciatica on and off for the last 2 years for which she had laser treatments from a chiropractor that helped - After hiking and starting play tennis in March, she reports a constant pain in her right ankle - She says the pain hurts in her right ankle and then she feels a cramp of her calf - Pain is worse after standing all day at work - No swelling, no improvement with rest - Crossing her ankles hurt worse - She is tried taking ibuprofen twice and it did not help her pain  Decreased hearing in right ear - Intermittent mild pain in right ear, but larger complaint is decreased hearing - Denies symptoms of eustachian tube dysfunction, but does report she has allergic rhinitis - Reports she was told in the past that she had fluid behind her ear and it was never treated  Asthma vs COPD - Patient reports she has been told that she has COPD or asthma in the past - never had PFTs - doesn't take any medications for it  Patient would like to return in ~27m for annual physical and IUD exchange  Review of Systems:  Per HPI. All other systems reviewed and are negative.   PMH, PSH, Medications, Allergies, and FmHx reviewed and updated in EMR.  Social History: current smoker  Objective:  BP 107/70 mmHg  Pulse 79  Temp(Src) 98.3 F (36.8 C) (Oral)  Ht 5' 4.5" (1.638 m)  Wt 188 lb 6.4 oz (85.458 kg)  BMI 31.85 kg/m2  Gen:  39 y.o. female in NAD  HEENT: NCAT, MMM, EOMI, PERRL, anicteric sclerae, TMs clear b/l - R TM with mild fluid behind it, R ear canal discolored, no redness CV: RRR, no MRG, intact distal pulses Resp: Non-labored, CTAB, end expiratory wheezes noted Ext: WWP, no edema MSK: Full ROM, strength intact, TTP over  R ankle ATFL, Pain with plantarflexion and internal rotation, no edema or effusion, no TTP over fibular head or base of 5th metatarsal Neuro: Alert and oriented, speech normal    Assessment:     Monica Sandoval is a 39 y.o. female here for right ankle pain, decreased hearing in right ear.    Plan:     See problem list for problem-specific plans.   Erasmo Downer, MD PGY-2,  [facility] 04/17/2015  1:07 PM

## 2015-04-17 NOTE — Assessment & Plan Note (Signed)
Suspect ATFL strain Recommended ankle exercises 3 times a day Recommended ankle brace for 2 weeks while standing at work Recommended rest when possible Take ibuprofen  3 times a day for pain and anti-inflammatory Return precautions given

## 2015-06-23 ENCOUNTER — Ambulatory Visit: Payer: 59 | Attending: Audiology | Admitting: Audiology

## 2015-06-23 ENCOUNTER — Other Ambulatory Visit: Payer: Self-pay | Admitting: Family Medicine

## 2015-06-23 DIAGNOSIS — Z01118 Encounter for examination of ears and hearing with other abnormal findings: Secondary | ICD-10-CM | POA: Diagnosis present

## 2015-06-23 DIAGNOSIS — H748X2 Other specified disorders of left middle ear and mastoid: Secondary | ICD-10-CM | POA: Diagnosis present

## 2015-06-23 DIAGNOSIS — H9071 Mixed conductive and sensorineural hearing loss, unilateral, right ear, with unrestricted hearing on the contralateral side: Secondary | ICD-10-CM | POA: Diagnosis present

## 2015-06-23 DIAGNOSIS — R292 Abnormal reflex: Secondary | ICD-10-CM | POA: Insufficient documentation

## 2015-06-23 DIAGNOSIS — R94128 Abnormal results of other function studies of ear and other special senses: Secondary | ICD-10-CM

## 2015-06-23 DIAGNOSIS — H9191 Unspecified hearing loss, right ear: Secondary | ICD-10-CM

## 2015-06-23 DIAGNOSIS — H9193 Unspecified hearing loss, bilateral: Secondary | ICD-10-CM

## 2015-06-23 NOTE — Procedures (Signed)
[facility] [address] [place], [place] [address] [phone]  AUDIOLOGICAL EVALUATION  Name: Monica Sandoval   Outpatient DOB: 08/22/1976    Referent: Shirlee LatchAngela Bacigalupo, MD MRN: [id number] Date: 06/23/2015    Diagnosis: Right hearing loss  HISTORY: Monica Sandoval, age 39 y.o. years, was seen for an audiological evaluation.  She has recently started "feeling unsteady" or that her balance is off but states that she has had problems with her "right ear for 10+ years" .  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 She reports a history of "sinus issues" and frequently "feels pressure with occasional pain in her right ear'.   Monica Sandoval reports occasional tinnitus that is not problematic. Monica Sandoval reports a significant difference in hearing ability between the left and right ear and depends "on lipreading to understand others".  Significant is that "every couple of weeks" Monica Sandoval feels pressure in the right ear and "with a q-tip" removes "dark green, brownish material from the right canal".  She has never smelled it, but states that the removal helps with the "feeling of pressure".   EVALUATION: Pure tone air and tone conduction was completed using conventional audiometry and inserts. The hearing loss is poorer on the right side. Right hearing thresholds are 65 dBHL a 250Hz ' 50-55 dBHL at 500Hz  - 1000Hz ; 35-40 dBHL from 1000Hz  - 2000Hz ; 45-55 dBHL from 3000Hz  - 8000Hz . Right masked bone conduction ranges from 20-40 dBHL from 250Hz  - 8000Hz .  The left ear hearing thresholds are 30 dBHL at 250Hz  and 10-25 dBHL from 500Hz  - 8000Hz  with unmasked bone conduction 5-15 dBHL.  Speech reception thresholds are 40 dBHL on the right and 20 dBHL on the left using recorded  spondee words.  Word recognition is 96% at 80dBHL (85dBHL masking) and 96% at 60dBHL in the left ear using recorded NU-6 word lists, in quiet. Otoscopic inspection reveals clear ear canals with visible tympanic membranes without redness.  Tympanometry showed had unusual, shaky configuration on the right but was roughly within normal limits (type A) with elevated or absent acoustic reflexes.  The left ear in the left ear has excessive compliance with normal pressure (Type Ad) with present to slightly elevated acoustic reflexes.  Distortion Product Otoacoustic Emission (DPOAE) testing from 2000Hz  - 10,000Hz  was absent on the right side and are mostly present on the left side, weak a 10kHz only.  Tone decay was negative bilaterally.  Acoustic reflex decay was unable to be completed because there was not enough of a defection - the results were inconclusive.     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  The right side has a moderate to severe low and high frequency hearing loss that improves to a mild hearing loss in the mid range. The hearing loss appears mixed on the right side.  The left side has normal to a slight hearing loss bilaterally with a slight conductive component.   Word recognition is excellent bilaterally, at very loud levels on the right side and at conversational speech levels on the right side.   This amount of hearing loss will adversely affect Monica Sandoval's ability to communicate in social and work settings.  She needs further  evaluation by an ENT because of the a) asymetrical hearing loss b) recent unsteadiness c) intermittent pain and perception of fullness in the right ear.    RECOMMENDATIONS: 1.   Monitor hearing closely with a repeat audiological evaluation in 3 months - here or at the ENT office (earlier if there is any change in hearing or ear pressure). 2.   Referral to an Ear, Nose and Throat physician,because of the a)  assymetrical hearing loss b) recent       unsteadiness c) intermittent pain and perception of fullness in the right ear.   Deborah L. Kate Sable, Au.D., CCC-A Doctor of Audiology 06/23/2015  cc: Shirlee Latch, MD

## 2015-11-04 ENCOUNTER — Emergency Department (HOSPITAL_COMMUNITY)
Admission: EM | Admit: 2015-11-04 | Discharge: 2015-11-04 | Disposition: A | Discharge status: Home / Self Care | Payer: No Typology Code available for payment source | Attending: Emergency Medicine | Admitting: Emergency Medicine

## 2015-11-04 ENCOUNTER — Encounter (HOSPITAL_COMMUNITY): Payer: Self-pay | Admitting: Emergency Medicine

## 2015-11-04 DIAGNOSIS — K0889 Other specified disorders of teeth and supporting structures: Secondary | ICD-10-CM

## 2015-11-04 DIAGNOSIS — Z7951 Long term (current) use of inhaled steroids: Secondary | ICD-10-CM | POA: Insufficient documentation

## 2015-11-04 DIAGNOSIS — J45909 Unspecified asthma, uncomplicated: Secondary | ICD-10-CM | POA: Insufficient documentation

## 2015-11-04 DIAGNOSIS — F17210 Nicotine dependence, cigarettes, uncomplicated: Secondary | ICD-10-CM | POA: Insufficient documentation

## 2015-11-04 DIAGNOSIS — K029 Dental caries, unspecified: Secondary | ICD-10-CM | POA: Insufficient documentation

## 2015-11-04 DIAGNOSIS — Z79899 Other long term (current) drug therapy: Secondary | ICD-10-CM | POA: Insufficient documentation

## 2015-11-04 MED ORDER — BUPIVACAINE-EPINEPHRINE (PF) 0.5% -1:200000 IJ SOLN
1.8000 mL | Freq: Once | INTRAMUSCULAR | Status: AC
  Administered 2015-11-04: 1.8 mL
  Filled 2015-11-04: qty 1.8

## 2015-11-04 MED ORDER — TRAMADOL HCL 50 MG PO TABS: 50.0000 mg | ORAL_TABLET | Freq: Four times a day (QID) | ORAL | Status: DC | PRN

## 2015-11-04 MED ORDER — AMOXICILLIN 500 MG PO CAPS: 500.0000 mg | ORAL_CAPSULE | Freq: Three times a day (TID) | ORAL | Status: DC

## 2015-11-04 NOTE — ED Provider Notes (Signed)
CSN: [id number]     Arrival date & time 11/04/15  1920 History  By signing my name below, I, Monica Sandoval, attest that this documentation has been prepared under the direction and in the presence of Monica Peliffany Lasonja Lakins, PA-C. Electronically Signed: Ronney LionSuzanne Sandoval, ED Scribe. 11/04/2015. 9:02 PM.   Chief Complaint  Patient presents with  . Dental Pain    The history is provided by the patient. No language interpreter was used.    HPI Comments: [name] is a 40 y.o. female who presents to the Emergency Department complaining of constant, left lower dental pain radiating to her left jaw and ear that began 1 year ago and acutely worsened to a severity level of 10/10 about 2 days ago. She also notes developing a non-painful "knot" on her left-sided neck secondary to her symptoms. She states at one point, a filling had fallen out of her tooth, exposing a "hole." She states she has been taking ibuprofen with no relief. She has applied Orajel with moderate relief. Patient states she currently does not have a dentist due to insurance issues. She denies fever or vomiting.   Past Medical History  Diagnosis Date  . Asthma    Past Surgical History  Procedure Laterality Date  . Cholecystectomy  2008   Family History  Problem Relation Age of Onset  . Asthma Mother   . Rheum arthritis Mother   . Arthritis Mother   . Diabetes Father   . Heart disease Father   . Early death Father   . Diabetes Paternal Grandmother   . Heart disease Paternal Grandmother   . Cancer Paternal Grandmother    Social History  Substance Use Topics  . Smoking status: Current Every Day Smoker -- 0.50 packs/day for 8 years    Types: Cigarettes  . Smokeless tobacco: None     Comment: "thinking about quitting"  . Alcohol Use: No   OB History    No data available     Review of Systems A complete 10 system review of systems was obtained and all systems are negative except as noted in the HPI and PMH.    Allergies  Review  of patient's allergies indicates no known allergies.  Home Medications   Prior to Admission medications   Medication Sig Start Date End Date Taking? Authorizing Provider  acetaminophen (TYLENOL) 500 MG tablet Take 500 mg by mouth every 6 (six) hours as needed for mild pain.    Historical Provider, MD  albuterol (PROVENTIL HFA;VENTOLIN HFA) 108 (90 BASE) MCG/ACT inhaler Inhale 2 puffs into the lungs every 4 (four) hours as needed for wheezing. 08/30/14 12/16/16  Netta NeatAlexander J Karamalegos, DO  amoxicillin (AMOXIL) 500 MG capsule Take 1 capsule (500 mg total) by mouth 3 (three) times daily. 11/04/15   Lancer Thurner Neva SeatGreene, PA-C  benzonatate (TESSALON) 100 MG capsule Take 1 capsule (100 mg total) by mouth 2 (two) times daily as needed for cough. 08/30/14   Smitty CordsAlexander J Karamalegos, DO  esomeprazole (NEXIUM) 20 MG capsule Take 20 mg by mouth daily at 12 noon.    Historical Provider, MD  fluconazole (DIFLUCAN) 150 MG tablet Take 1 tablet (150 mg total) by mouth once. Take 2nd tablet 3 days later. 08/31/14   Smitty CordsAlexander J Karamalegos, DO  fluticasone (FLONASE) 50 MCG/ACT nasal spray Place 2 sprays into both nostrils daily. 08/30/14   Smitty CordsAlexander J Karamalegos, DO  traMADol (ULTRAM) 50 MG tablet Take 1 tablet (50 mg total) by mouth every 6 (six) hours as needed. 11/04/15  Shakim Faith Neva Seat, PA-C   BP 137/79 mmHg  Pulse 83  Temp(Src) 98.6 F (37 C) (Oral)  Resp 18  Wt 84.369 kg  SpO2 100% Physical Exam  Constitutional: She appears well-developed and well-nourished. No distress.  HENT:  Head: Normocephalic and atraumatic.  Mouth/Throat: Uvula is midline, oropharynx is clear and moist and mucous membranes are normal. Normal dentition. Dental caries (Pts tooth shows no obvious abscess but moderate to severe tenderness to palpation of marked tooth) present. No uvula swelling.    Eyes: Pupils are equal, round, and reactive to light.  Neck: Trachea normal, normal range of motion and full passive range of motion without pain.  Neck supple.  Cardiovascular: Normal rate, regular rhythm, normal heart sounds and normal pulses.   Pulmonary/Chest: Effort normal and breath sounds normal. No respiratory distress. Chest wall is not dull to percussion. She exhibits no tenderness, no crepitus, no edema, no deformity and no retraction.  Abdominal: Normal appearance.  Musculoskeletal: Normal range of motion.  Neurological: She is alert. She has normal strength.  Skin: Skin is warm, dry and intact. She is not diaphoretic.  Psychiatric: She has a normal mood and affect. Her speech is normal. Cognition and memory are normal.    ED Course  Procedures (including critical care time)  DIAGNOSTIC STUDIES: Oxygen Saturation is 100% on RA, normal by my interpretation.    COORDINATION OF CARE: 8:54 PM - Dental block administered by me. Patient reports significant improvement of her pain. Discussed treatment plan with pt at bedside which includes Rx antibiotics and a pain-relieving medication. Will give dental referral. Pt verbalized understanding and agreed to plan.   MDM   Final diagnoses:  Toothache   Patient with dentalgia.  No abscess requiring immediate incision and drainage.  Exam not concerning for Ludwig's angina or pharyngeal abscess.  Will treat with Rx amoxicillin and Ultram. Pt instructed to follow-up with dentist.  Discussed return precautions. Pt safe for discharge.  I personally performed the services described in this documentation, which was scribed in my presence. The recorded information has been reviewed and is accurate.     Monica Pel, PA-C 11/04/15 2206  Donnetta Hutching, MD 11/05/15 1710

## 2015-11-04 NOTE — ED Notes (Signed)
C/o left lower molar pain. States pain is radiating to ear and reports knot in throat. 10/10

## 2015-11-04 NOTE — Discharge Instructions (Signed)
[place] Free Dental Care Clinics (Also Low Cost And Sliding Scale) Home  [facility]  FreeDentalCare.us is a free website maintained by users like you. Our volunteers work hard to make sure the information on these clinics is up to date and accurate.   Services Listed: 1. Free Dental Clinics 2. Sliding Fee Scale Dental Clinics 3. Low Cost Affordable Dental Clinics 4. Non Profit Dental Clinics   Please be aware than not all clinics are completely free. Some cities also have a low number of clinics so in many cases we have included nearby clinics in the search results.  If you are aware of any clinics that offer free or low cost services to patients needing dental care please contact us. Also, if you are the owner of a clinic or work at a clinic that is listed on this website and wish to update our site please contact us.  The free dental care facilited listed in our [place], [place] page are mostly contributed by users like you that help improve the content quality of this free website. If you live in [place], [place] and cannot afford dental coverage there are government and non-profit programs that cater to local residents in need. These services include: Cleanings, Checkups, Caps, Dentures, Braces.  Help [name] Help You Tell us what type of assistance you are looking for. Help For Children and Single Mothers Low Income Housing Unemployment Radio broadcast assistant Assistance Drug and Alchohol Rehab      Top 6 clinics in or near [place] 1. [facility]   [address] [place], [place] - [address] [phone] Clinic Full Details  Public Health Department Dental Clinic. [facility]. Provides exams, treatment, cleanings, and emergency care for financially eligible children. We take children up to age 48 who are enrolled in  Medicaid or [place] Health Choice; pregnant women with a Medi  Clinic Full Details  2. [facility]   13 miles away from [facility]  [address] [place], [place] [address] [phone] Clinic Full Details  Nearby Dental Clinic: 13 miles from [facility] Department of Public Health Pediatric Dental clinic. Healthy teeth are an important part of healthy bodies. Our Dental Clinic staff provides exams, treatment, cleanings, and emergency care for financially eligible children. We take children up to age 51 who are enrolled website  Clinic Full Details  3. [facility] [employer]   21 miles away from [place]. [address] [place], [place] [address] [phone] Clinic Full Details  Nearby Dental Clinic: 21 miles from [place] [facility] [employer]. It is the mission of the Free Clinic to recognize the right of low income, uninsured citizens of [place] to have access to health care that compassionately meets their basic medical, dental and pharmacy needs. [facility] provides basic extractions,  website  Clinic Full Details  4. [facility]   22 miles away from [facility]  [address] [place], [place] [address] [phone] Clinic Full Details  [facility]: 22 miles from [facility]: To provide access to compassionate, high-quality healthcare services to the medically uninsured and underserved who reside in [place], [place] or [place] counties and meet the eligibility guidelines of the [facility]. What we do: - Provide access to high-quality medical and dental website  Clinic Full Details  5. [facility]   25 miles away from [facility]  [address] [place], [place]- [phone] Clinic Full Details  Nearby Dental Clinic: 25 miles from [facility].  Clinic Full Details  6. [facility]   [place] away [place], [place]- [phone]  Clinic Full Details  Nearby Dental Clinic: [place]  Dental Pain Dental pain may be caused by many things, including:  Tooth decay (cavities or caries). Cavities expose the nerve of your tooth to air and hot or cold temperatures. This can cause pain or discomfort.  Abscess or infection. A dental abscess is a collection of infected pus from a bacterial infection in the inner part of the tooth (pulp). It usually occurs at the end of the tooth's root.  Injury.  An unknown reason (idiopathic). Your pain may be mild or severe. It may only occur when:  You are chewing.  You are exposed to hot or cold temperature.  You are eating or drinking sugary foods or beverages, such as soda or candy. Your pain may also be constant. HOME CARE INSTRUCTIONS Watch your dental pain for any changes. The following actions may help to lessen any discomfort that you are feeling:  Take medicines only as directed by your dentist.  If you were prescribed an antibiotic medicine, finish all of it even if you start to feel better.  Keep all follow-up visits as directed by your dentist. This is important.  Do not apply heat to the outside of your face.  Rinse your mouth or gargle with salt water if directed by your dentist. This helps with pain and swelling.  You can make salt water by adding  tsp of salt to 1 cup of warm water.  Apply ice to the painful area of your face:  Put ice in a plastic bag.  Place a towel between your skin and the bag.  Leave the ice on for 20 minutes, 2-3 times per day.  Avoid foods or drinks that cause you pain, such as:  Very hot or very cold foods or drinks.  Sweet or sugary foods or drinks. SEEK MEDICAL CARE IF:  Your pain is not controlled with medicines.  Your symptoms are worse.  You have new symptoms. SEEK IMMEDIATE MEDICAL CARE IF:  You are unable to open your  mouth.  You are having trouble breathing or swallowing.  You have a fever.  Your face, neck, or jaw is swollen.   This information is not intended to replace advice given to you by your health care provider. Make sure you discuss any questions you have with your health care provider.   Document Released: 08/09/2005 Document Revised: 12/24/2014 Document Reviewed: 08/05/2014 Elsevier Interactive Patient Education [employer]

## 2015-12-24 ENCOUNTER — Emergency Department (HOSPITAL_COMMUNITY): Payer: Self-pay

## 2015-12-24 ENCOUNTER — Emergency Department (HOSPITAL_COMMUNITY)
Admission: EM | Admit: 2015-12-24 | Discharge: 2015-12-24 | Disposition: A | Discharge status: Home / Self Care | Payer: Self-pay | Attending: Emergency Medicine | Admitting: Emergency Medicine

## 2015-12-24 ENCOUNTER — Encounter (HOSPITAL_COMMUNITY): Payer: Self-pay | Admitting: Emergency Medicine

## 2015-12-24 DIAGNOSIS — R079 Chest pain, unspecified: Secondary | ICD-10-CM

## 2015-12-24 DIAGNOSIS — K219 Gastro-esophageal reflux disease without esophagitis: Secondary | ICD-10-CM | POA: Insufficient documentation

## 2015-12-24 DIAGNOSIS — M79602 Pain in left arm: Secondary | ICD-10-CM | POA: Insufficient documentation

## 2015-12-24 DIAGNOSIS — R55 Syncope and collapse: Secondary | ICD-10-CM | POA: Insufficient documentation

## 2015-12-24 DIAGNOSIS — J45901 Unspecified asthma with (acute) exacerbation: Secondary | ICD-10-CM | POA: Insufficient documentation

## 2015-12-24 DIAGNOSIS — Z79899 Other long term (current) drug therapy: Secondary | ICD-10-CM | POA: Insufficient documentation

## 2015-12-24 DIAGNOSIS — R002 Palpitations: Secondary | ICD-10-CM | POA: Insufficient documentation

## 2015-12-24 DIAGNOSIS — F17210 Nicotine dependence, cigarettes, uncomplicated: Secondary | ICD-10-CM | POA: Insufficient documentation

## 2015-12-24 DIAGNOSIS — R61 Generalized hyperhidrosis: Secondary | ICD-10-CM | POA: Insufficient documentation

## 2015-12-24 LAB — I-STAT TROPONIN, ED
Troponin i, poc: 0 ng/mL (ref 0.00–0.08)
Troponin i, poc: 0 ng/mL (ref 0.00–0.08)

## 2015-12-24 LAB — CBC
HEMATOCRIT: 41.2 % (ref 36.0–46.0)
Hemoglobin: 14.4 g/dL (ref 12.0–15.0)
MCH: 30.4 pg (ref 26.0–34.0)
MCHC: 35 g/dL (ref 30.0–36.0)
MCV: 87.1 fL (ref 78.0–100.0)
Platelets: 278 10*3/uL (ref 150–400)
RBC: 4.73 MIL/uL (ref 3.87–5.11)
RDW: 14.2 % (ref 11.5–15.5)
WBC: 9.3 10*3/uL (ref 4.0–10.5)

## 2015-12-24 LAB — BASIC METABOLIC PANEL
Anion gap: 9 (ref 5–15)
BUN: 9 mg/dL (ref 6–20)
CO2: 21 mmol/L — AB (ref 22–32)
Calcium: 8.9 mg/dL (ref 8.9–10.3)
Chloride: 106 mmol/L (ref 101–111)
Creatinine, Ser: 0.87 mg/dL (ref 0.44–1.00)
GFR calc Af Amer: >60 mL/min (ref >60)
GLUCOSE: 131 mg/dL — AB (ref 65–99)
POTASSIUM: 3.4 mmol/L — AB (ref 3.5–5.1)
Sodium: 136 mmol/L (ref 135–145)

## 2015-12-24 MED ORDER — PANTOPRAZOLE SODIUM 20 MG PO TBEC
20.0000 mg | DELAYED_RELEASE_TABLET | Freq: Every day | ORAL | Status: DC
Start: 2015-12-24 — End: 2016-10-28

## 2015-12-24 MED ORDER — LIDOCAINE HCL 2 % IJ SOLN
INTRAMUSCULAR | Status: AC
  Filled 2015-12-24: qty 20

## 2015-12-24 NOTE — ED Provider Notes (Signed)
CSN: [id number]     Arrival date & time 12/24/15  0037 History   First MD Initiated Contact with Patient 12/24/15 0256     Chief Complaint  Patient presents with  . Chest Pain     (Consider location/radiation/quality/duration/timing/severity/associated sxs/prior Treatment) Patient is a 40 y.o. female presenting with chest pain.  Chest Pain Pain location:  Substernal area Pain quality: pressure   Pain radiates to:  L arm Pain radiates to the back: no   Pain severity now: 7. Onset quality:  Gradual Timing:  Constant Progression:  Improving Context: at rest   Context: not breathing, not eating, not lifting, no movement, not raising an arm, no stress and no trauma   Relieved by:  Rest and aspirin Worsened by:  Nothing tried Ineffective treatments:  None tried Associated symptoms: cough (baseline cough not changed, non productive), diaphoresis, dizziness, heartburn, nausea, near-syncope, palpitations and shortness of breath   Associated symptoms: no abdominal pain, no altered mental status, no anorexia, no back pain, no dysphagia and no numbness   Risk factors: smoking   Risk factors: no birth control, no coronary artery disease, no diabetes mellitus, no high cholesterol, no hypertension, no immobilization and no prior DVT/PE   Risk factors comment:  Dad MI at 5257   Patient is a 40 year old female, current smoker, who presents emergency Department complaining of substernal, central chest pain that began at approximately midnight. It began she was at rest going to bed, described as a pressure and squeezing with mostly associated left arm pain and then numbness. Here rates the severity of pain 7 out of 10, the chest pain persisted for approximately 90 minutes.  It was not worsened with positional changes or exertion. She states that she had associated anxiety and rapid heart rate.  Her chest pain seemed to alleviate one she had calmed down while in the ER waiting room. She states she also took  an aspirin while they're driving to the ER.  While having chest pain and she also experienced diaphoresis, dizziness, nausea, shortness of breath.  She states that she does have anxiety and a hiatal hernia which causes her chest pain in the same area.  She reports a baseline cough that is nonproductive, and frequency has wheezing at night and early in the morning but not throughout the day. She denies palpitations, lower extremity edema, recent travel. She has no history of diabetes, hypertension, hyperlipidemia, coronary artery disease.  Her father had a heart attack at age 40.   AT the time of this history, patient has no chest pain, no shortness of breath.  She denies any recent URI symptoms.  Past Medical History  Diagnosis Date  . Asthma    Past Surgical History  Procedure Laterality Date  . Cholecystectomy  2008   Family History  Problem Relation Age of Onset  . Asthma Mother   . Rheum arthritis Mother   . Arthritis Mother   . Diabetes Father   . Heart disease Father   . Early death Father   . Diabetes Paternal Grandmother   . Heart disease Paternal Grandmother   . Cancer Paternal Grandmother    Social History  Substance Use Topics  . Smoking status: Current Every Day Smoker -- 0.50 packs/day for 8 years    Types: Cigarettes  . Smokeless tobacco: None     Comment: "thinking about quitting"  . Alcohol Use: No   OB History    No data available     Review of Systems  Constitutional: Positive for diaphoresis.  HENT: Negative for trouble swallowing.   Respiratory: Positive for cough (baseline cough not changed, non productive) and shortness of breath.   Cardiovascular: Positive for chest pain, palpitations and near-syncope.  Gastrointestinal: Positive for heartburn and nausea. Negative for abdominal pain and anorexia.  Musculoskeletal: Negative for back pain.  Neurological: Positive for dizziness. Negative for numbness.  All other systems reviewed and are  negative.     Allergies  Review of patient's allergies indicates no known allergies.  Home Medications   Prior to Admission medications   Medication Sig Start Date End Date Taking? Authorizing Provider  albuterol (PROVENTIL HFA;VENTOLIN HFA) 108 (90 BASE) MCG/ACT inhaler Inhale 2 puffs into the lungs every 4 (four) hours as needed for wheezing. 08/30/14 12/16/16 Yes Alexander J Karamalegos, DO  aspirin 325 MG tablet Take 325 mg by mouth once.   Yes Historical Provider, MD  ibuprofen (ADVIL,MOTRIN) 200 MG tablet Take 400 mg by mouth every 6 (six) hours as needed for headache, mild pain or moderate pain.   Yes Historical Provider, MD  amoxicillin (AMOXIL) 500 MG capsule Take 1 capsule (500 mg total) by mouth 3 (three) times daily. Patient not taking: Reported on 12/24/2015 11/04/15   Marlon Pel, PA-C  pantoprazole (PROTONIX) 20 MG tablet Take 1 tablet (20 mg total) by mouth daily. 12/24/15   Danelle Berry, PA-C  traMADol (ULTRAM) 50 MG tablet Take 1 tablet (50 mg total) by mouth every 6 (six) hours as needed. Patient not taking: Reported on 12/24/2015 11/04/15   Marlon Pel, PA-C   BP 103/68 mmHg  Pulse 70  Temp(Src) 98.2 F (36.8 C) (Oral)  Resp 19  SpO2 96% Physical Exam  Constitutional: She is oriented to person, place, and time. She appears well-developed and well-nourished. No distress.  HENT:  Head: Normocephalic and atraumatic.  Nose: Nose normal.  Mouth/Throat: Oropharynx is clear and moist. No oropharyngeal exudate.  Eyes: Conjunctivae and EOM are normal. Pupils are equal, round, and reactive to light. Right eye exhibits no discharge. Left eye exhibits no discharge. No scleral icterus.  Neck: Normal range of motion. No JVD present. No tracheal deviation present. No thyromegaly present.  Cardiovascular: Normal rate, regular rhythm, normal heart sounds and intact distal pulses.  Exam reveals no gallop and no friction rub.   No murmur heard. Symmetrical radial pulses 2+, dorsal  pedis pulses 2+  Pulmonary/Chest: Effort normal. No respiratory distress. She has wheezes. She has no rales. She exhibits no tenderness.  Mild expiratory wheeze in upper lung fields  Abdominal: Soft. Bowel sounds are normal. She exhibits no distension and no mass. There is no tenderness. There is no rebound and no guarding.  Musculoskeletal: Normal range of motion. She exhibits no edema or tenderness.  Lymphadenopathy:    She has no cervical adenopathy.  Neurological: She is alert and oriented to person, place, and time. She has normal reflexes. No cranial nerve deficit. She exhibits normal muscle tone. Coordination normal.  Skin: Skin is warm and dry. No rash noted. She is not diaphoretic. No erythema. No pallor.  Psychiatric: She has a normal mood and affect. Her behavior is normal. Judgment and thought content normal.  Nursing note and vitals reviewed.   ED Course  Procedures (including critical care time) Labs Review Labs Reviewed  BASIC METABOLIC PANEL - Abnormal; Notable for the following:    Potassium 3.4 (*)    CO2 21 (*)    Glucose, Bld 131 (*)    All other components within  normal limits  CBC  I-STAT TROPOININ, ED  Rosezena Sensor, ED    Imaging Review Dg Chest 2 View  12/24/2015  CLINICAL DATA:  Sudden onset upper mid chest this morning. Lightheaded. EXAM: CHEST  2 VIEW COMPARISON:  12/09/2014 FINDINGS: The heart size and mediastinal contours are within normal limits. Both lungs are clear. The visualized skeletal structures are unremarkable. IMPRESSION: No active cardiopulmonary disease. Electronically Signed   By: Burman Nieves M.D.   On: 12/24/2015 01:50   I have personally reviewed and evaluated these images and lab results as part of my medical decision-making.   EKG Interpretation   Date/Time:  Wednesday Dec 24 2015 00:40:38 EDT Ventricular Rate:  118 PR Interval:  153 QRS Duration: 90 QT Interval:  318 QTC Calculation: 445 R Axis:   64 Text  Interpretation:  Sinus tachycardia Consider left atrial enlargement  Low voltage, precordial leads Confirmed by DELO  MD, DOUGLAS ([id number]) on  12/24/2015 6:06:09 AM      MDM   40 y/o female, current smoker, presents with central CP that began tonight at midnight.  Cardiac work up initiated. Heart score of 3.  Pt took ASA in route to ER  Workup is significant for mild hypokalemia, low CO2, likely secondary to hyperventilation when the patient was anxious. Chest x-ray was negative for acute findings. EKG was sinus tach.  Do not suspect PE, pt is PERC negative. Suspect there was some aspect of anxiety associated with CP and many following sx.  Delta-troponin negative - 0.00, the patient is safe to discharge home with low likelihood of ACS.  Patient's hiatal hernia and persistent epigastric and chest pain is currently not treated with any H2 blockers or PPIs. Will initiate PPI trial encouraged follow-up with PCP. Encouraged smoking cessation.   Pt was discharged in good condition with VSS. Filed Vitals:   12/24/15 0230 12/24/15 0249 12/24/15 0430 12/24/15 0500  BP:  114/67 104/68 103/68  Pulse:  66 80 70  Temp:      TempSrc:      Resp: SpO2:  93% 97% 96%     Final diagnoses:  Chest pain, unspecified chest pain type  Gastroesophageal reflux disease, esophagitis presence not specified      Danelle Berry, PA-C 12/24/15 [id number]  Geoffery Lyons, MD 12/24/15 1308

## 2015-12-24 NOTE — ED Notes (Signed)
Pt states about 45 minutes ago she went to bed  Pt states she felt like her heart was pounding really had  Pt states then she felt a piercing pain in the muscle in her left arm then her arm started to feel numb  Pt states she has a pain in the center of her chest that is like a heavy pressure  Pt states she has been diaphoretic and light headed

## 2015-12-24 NOTE — Discharge Instructions (Signed)
Nonspecific Chest Pain  °Chest pain can be caused by many different conditions. There is always a chance that your pain could be related to something serious, such as a heart attack or a blood clot in your lungs. Chest pain can also be caused by conditions that are not life-threatening. If you have chest pain, it is very important to follow up with your health care provider. °CAUSES  °Chest pain can be caused by: °· Heartburn. °· Pneumonia or bronchitis. °· Anxiety or stress. °· Inflammation around your heart (pericarditis) or lung (pleuritis or pleurisy). °· A blood clot in your lung. °· A collapsed lung (pneumothorax). It can develop suddenly on its own (spontaneous pneumothorax) or from trauma to the chest. °· Shingles infection (varicella-zoster virus). °· Heart attack. °· Damage to the bones, muscles, and cartilage that make up your chest wall. This can include: °· Bruised bones due to injury. °· Strained muscles or cartilage due to frequent or repeated coughing or overwork. °· Fracture to one or more ribs. °· Sore cartilage due to inflammation (costochondritis). °RISK FACTORS  °Risk factors for chest pain may include: °· Activities that increase your risk for trauma or injury to your chest. °· Respiratory infections or conditions that cause frequent coughing. °· Medical conditions or overeating that can cause heartburn. °· Heart disease or family history of heart disease. °· Conditions or health behaviors that increase your risk of developing a blood clot. °· Having had chicken pox (varicella zoster). °SIGNS AND SYMPTOMS °Chest pain can feel like: °· Burning or tingling on the surface of your chest or deep in your chest. °· Crushing, pressure, aching, or squeezing pain. °· Dull or sharp pain that is worse when you move, cough, or take a deep breath. °· Pain that is also felt in your back, neck, shoulder, or arm, or pain that spreads to any of these areas. °Your chest pain may come and go, or it may stay  constant. °DIAGNOSIS °Lab tests or other studies may be needed to find the cause of your pain. Your health care provider may have you take a test called an ambulatory ECG (electrocardiogram). An ECG records your heartbeat patterns at the time the test is performed. You may also have other tests, such as: °· Transthoracic echocardiogram (TTE). During echocardiography, sound waves are used to create a picture of all of the heart structures and to look at how blood flows through your heart. °· Transesophageal echocardiogram (TEE). This is a more advanced imaging test that obtains images from inside your body. It allows your health care provider to see your heart in finer detail. °· Cardiac monitoring. This allows your health care provider to monitor your heart rate and rhythm in real time. °· Holter monitor. This is a portable device that records your heartbeat and can help to diagnose abnormal heartbeats. It allows your health care provider to track your heart activity for several days, if needed. °· Stress tests. These can be done through exercise or by taking medicine that makes your heart beat more quickly. °· Blood tests. °· Imaging tests. °TREATMENT  °Your treatment depends on what is causing your chest pain. Treatment may include: °· Medicines. These may include: °· Acid blockers for heartburn. °· Anti-inflammatory medicine. °· Pain medicine for inflammatory conditions. °· Antibiotic medicine, if an infection is present. °· Medicines to dissolve blood clots. °· Medicines to treat coronary artery disease. °· Supportive care for conditions that do not require medicines. This may include: °· Resting. °· Applying heat   or cold packs to injured areas. °· Limiting activities until pain decreases. °HOME CARE INSTRUCTIONS °· If you were prescribed an antibiotic medicine, finish it all even if you start to feel better. °· Avoid any activities that bring on chest pain. °· Do not use any tobacco products, including  cigarettes, chewing tobacco, or electronic cigarettes. If you need help quitting, ask your health care provider. °· Do not drink alcohol. °· Take medicines only as directed by your health care provider. °· Keep all follow-up visits as directed by your health care provider. This is important. This includes any further testing if your chest pain does not go away. °· If heartburn is the cause for your chest pain, you may be told to keep your head raised (elevated) while sleeping. This reduces the chance that acid will go from your stomach into your esophagus. °· Make lifestyle changes as directed by your health care provider. These may include: °¨ Getting regular exercise. Ask your health care provider to suggest some activities that are safe for you. °¨ Eating a heart-healthy diet. A registered dietitian can help you to learn healthy eating options. °¨ Maintaining a healthy weight. °¨ Managing diabetes, if necessary. °¨ Reducing stress. °SEEK MEDICAL CARE IF: °· Your chest pain does not go away after treatment. °· You have a rash with blisters on your chest. °· You have a fever. °SEEK IMMEDIATE MEDICAL CARE IF:  °· Your chest pain is worse. °· You have an increasing cough, or you cough up blood. °· You have severe abdominal pain. °· You have severe weakness. °· You faint. °· You have chills. °· You have sudden, unexplained chest discomfort. °· You have sudden, unexplained discomfort in your arms, back, neck, or jaw. °· You have shortness of breath at any time. °· You suddenly start to sweat, or your skin gets clammy. °· You feel nauseous or you vomit. °· You suddenly feel light-headed or dizzy. °· Your heart begins to beat quickly, or it feels like it is skipping beats. °These symptoms may represent a serious problem that is an emergency. Do not wait to see if the symptoms will go away. Get medical help right away. Call your local emergency services (911 in the [place] Do not drive yourself to the hospital. °  °This  information is not intended to replace advice given to you by your health care provider. Make sure you discuss any questions you have with your health care provider. °  °Document Released: 05/19/2005 Document Revised: 08/30/2014 Document Reviewed: 03/15/2014 °Elsevier Interactive Patient Education ©2016 [employer] ° °Gastroesophageal Reflux Disease, Adult °Normally, food travels down the esophagus and stays in the stomach to be digested. However, when a person has gastroesophageal reflux disease (GERD), food and stomach acid move back up into the esophagus. When this happens, the esophagus becomes sore and inflamed. Over time, GERD can create small holes (ulcers) in the lining of the esophagus.  °CAUSES °This condition is caused by a problem with the muscle between the esophagus and the stomach (lower esophageal sphincter, or LES). Normally, the LES muscle closes after food passes through the esophagus to the stomach. When the LES is weakened or abnormal, it does not close properly, and that allows food and stomach acid to go back up into the esophagus. The LES can be weakened by certain dietary substances, medicines, and medical conditions, including: °· Tobacco use. °· Pregnancy. °· Having a hiatal hernia. °· Heavy alcohol use. °· Certain foods and beverages, such as coffee,   chocolate, onions, and peppermint. °RISK FACTORS °This condition is more likely to develop in: °· People who have an increased body weight. °· People who have connective tissue disorders. °· People who use NSAID medicines. °SYMPTOMS °Symptoms of this condition include: °· Heartburn. °· Difficult or painful swallowing. °· The feeling of having a lump in the throat. °· A bitter taste in the mouth. °· Bad breath. °· Having a large amount of saliva. °· Having an upset or bloated stomach. °· Belching. °· Chest pain. °· Shortness of breath or wheezing. °· Ongoing (chronic) cough or a night-time cough. °· Wearing away of tooth enamel. °· Weight  loss. °Different conditions can cause chest pain. Make sure to see your health care provider if you experience chest pain. °DIAGNOSIS °Your health care provider will take a medical history and perform a physical exam. To determine if you have mild or severe GERD, your health care provider may also monitor how you respond to treatment. You may also have other tests, including: °· An endoscopy to examine your stomach and esophagus with a small camera. °· A test that measures the acidity level in your esophagus. °· A test that measures how much pressure is on your esophagus. °· A barium swallow or modified barium swallow to show the shape, size, and functioning of your esophagus. °TREATMENT °The goal of treatment is to help relieve your symptoms and to prevent complications. Treatment for this condition may vary depending on how severe your symptoms are. Your health care provider may recommend: °· Changes to your diet. °· Medicine. °· Surgery. °HOME CARE INSTRUCTIONS °Diet °· Follow a diet as recommended by your health care provider. This may involve avoiding foods and drinks such as: °¨ Coffee and tea (with or without caffeine). °¨ Drinks that contain alcohol. °¨ Energy drinks and sports drinks. °¨ Carbonated drinks or sodas. °¨ Chocolate and cocoa. °¨ Peppermint and mint flavorings. °¨ Garlic and onions. °¨ Horseradish. °¨ Spicy and acidic foods, including peppers, chili powder, curry powder, vinegar, hot sauces, and barbecue sauce. °¨ Citrus fruit juices and citrus fruits, such as oranges, lemons, and limes. °¨ Tomato-based foods, such as red sauce, chili, salsa, and pizza with red sauce. °¨ Fried and fatty foods, such as donuts, french fries, potato chips, and high-fat dressings. °¨ High-fat meats, such as hot dogs and fatty cuts of red and white meats, such as rib eye steak, sausage, ham, and bacon. °¨ High-fat dairy items, such as whole milk, butter, and cream cheese. °· Eat small, frequent meals instead of large  meals. °· Avoid drinking large amounts of liquid with your meals. °· Avoid eating meals during the 2-3 hours before bedtime. °· Avoid lying down right after you eat. °· Do not exercise right after you eat. ° General Instructions  °· Pay attention to any changes in your symptoms. °· Take over-the-counter and prescription medicines only as told by your health care provider. Do not take aspirin, ibuprofen, or other NSAIDs unless your health care provider told you to do so. °· Do not use any tobacco products, including cigarettes, chewing tobacco, and e-cigarettes. If you need help quitting, ask your health care provider. °· Wear loose-fitting clothing. Do not wear anything tight around your waist that causes pressure on your abdomen. °· Raise (elevate) the head of your bed 6 inches (15cm). °· Try to reduce your stress, such as with yoga or meditation. If you need help reducing stress, ask your health care provider. °· If you are overweight, reduce your weight to an amount that is   healthy for you. Ask your health care provider for guidance about a safe weight loss goal. °· Keep all follow-up visits as told by your health care provider. This is important. °SEEK MEDICAL CARE IF: °· You have new symptoms. °· You have unexplained weight loss. °· You have difficulty swallowing, or it hurts to swallow. °· You have wheezing or a persistent cough. °· Your symptoms do not improve with treatment. °· You have a hoarse voice. °SEEK IMMEDIATE MEDICAL CARE IF: °· You have pain in your arms, neck, jaw, teeth, or back. °· You feel sweaty, dizzy, or light-headed. °· You have chest pain or shortness of breath. °· You vomit and your vomit looks like blood or coffee grounds. °· You faint. °· Your stool is bloody or black. °· You cannot swallow, drink, or eat. °  °This information is not intended to replace advice given to you by your health care provider. Make sure you discuss any questions you have with your health care provider. °    °Document Released: 05/19/2005 Document Revised: 04/30/2015 Document Reviewed: 12/04/2014 °Elsevier Interactive Patient Education ©2016 [employer] ° °

## 2016-06-03 ENCOUNTER — Emergency Department (HOSPITAL_COMMUNITY)
Admission: EM | Admit: 2016-06-03 | Discharge: 2016-06-03 | Disposition: A | Discharge status: Home / Self Care | Payer: Self-pay | Attending: Emergency Medicine | Admitting: Emergency Medicine

## 2016-06-03 ENCOUNTER — Encounter (HOSPITAL_COMMUNITY): Payer: Self-pay | Admitting: Emergency Medicine

## 2016-06-03 DIAGNOSIS — Z7982 Long term (current) use of aspirin: Secondary | ICD-10-CM | POA: Insufficient documentation

## 2016-06-03 DIAGNOSIS — F17210 Nicotine dependence, cigarettes, uncomplicated: Secondary | ICD-10-CM | POA: Insufficient documentation

## 2016-06-03 DIAGNOSIS — K029 Dental caries, unspecified: Secondary | ICD-10-CM | POA: Insufficient documentation

## 2016-06-03 DIAGNOSIS — H6591 Unspecified nonsuppurative otitis media, right ear: Secondary | ICD-10-CM

## 2016-06-03 DIAGNOSIS — J45909 Unspecified asthma, uncomplicated: Secondary | ICD-10-CM | POA: Insufficient documentation

## 2016-06-03 DIAGNOSIS — H748X1 Other specified disorders of right middle ear and mastoid: Secondary | ICD-10-CM | POA: Insufficient documentation

## 2016-06-03 DIAGNOSIS — M542 Cervicalgia: Secondary | ICD-10-CM | POA: Insufficient documentation

## 2016-06-03 LAB — RAPID STREP SCREEN (MED CTR MEBANE ONLY): STREPTOCOCCUS, GROUP A SCREEN (DIRECT): NEGATIVE

## 2016-06-03 MED ORDER — METHOCARBAMOL 500 MG PO TABS
500.0000 mg | ORAL_TABLET | Freq: Two times a day (BID) | ORAL | 0 refills | Status: DC
Start: 2016-06-03 — End: 2016-10-28

## 2016-06-03 MED ORDER — AMOXICILLIN-POT CLAVULANATE 875-125 MG PO TABS
1.0000 | ORAL_TABLET | Freq: Two times a day (BID) | ORAL | 0 refills | Status: DC
Start: 2016-06-03 — End: 2018-03-15

## 2016-06-03 MED ORDER — KETOROLAC TROMETHAMINE 60 MG/2ML IM SOLN
30.0000 mg | Freq: Once | INTRAMUSCULAR | Status: AC
  Administered 2016-06-03: 30 mg via INTRAMUSCULAR
  Filled 2016-06-03: qty 2

## 2016-06-03 MED ORDER — NAPROXEN 500 MG PO TABS: 500.0000 mg | ORAL_TABLET | Freq: Two times a day (BID) | ORAL | 0 refills | Status: DC

## 2016-06-03 NOTE — Discharge Instructions (Signed)
Take antibiotics as prescribed. Take pain medication as prescribed as needed. Follow up with ENT and orthopedics. Return to the ER for new or worsening symptoms.

## 2016-06-03 NOTE — ED Provider Notes (Signed)
[facility]-EMERGENCY DEPT Provider Note   CSN: [id number] Arrival date & time: 06/03/16  1806  By signing my name below, I, Soijett Blue, attest that this documentation has been prepared under the direction and in the presence of Madisen Ludvigsen Y. Emmanuel Gruenhagen, PA-C Electronically Signed: Soijett Blue, ED Scribe. 06/03/16. 6:52 PM   History   Chief Complaint Chief Complaint  Patient presents with  . Neck Pain  . Sore Throat    HPI Monica Sandoval is a 40 y.o. female who presents to the Emergency Department complaining of gradually worsening right sided neck pain onset 1 month. Pt notes that her symptoms began to her right shoulder with a pulling and tightness sensation. Pt denies any recent injury or life stressors prior to the onset of her symptoms. Pt denies having a dentist or dental insurance. Pt is having associated symptoms of right shoulder pain, sore throat x 2 weeks, and subjective fever. She notes that she has tried ice, heat, massages, tylenol arthritis, ibuprofen, neck pillow with no relief of her symptoms. She denies drooling, trouble swallowing, ear pain, chills, and any other symptoms.    The history is provided by the patient. No language interpreter was used.    Past Medical History:  Diagnosis Date  . Asthma     Patient Active Problem List   Diagnosis Date Noted  . Wheeze 04/17/2015  . Right ankle pain 04/17/2015  . Swelling of hand 11/12/2011  . Discoloration of skin of hand 11/12/2011  . Healthcare maintenance 11/12/2011  . Hearing loss 10/17/2009  . HEMORRHOIDS, INTERNAL, WITH BLEEDING 01/12/2008  . TOBACCO DEPENDENCE 10/20/2006  . TENSION HEADACHE 10/20/2006  . 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 CHOLECYSTECTOMY  2008    OB History    No data available       Home Medications    Prior to Admission medications   Medication Sig Start Date End Date Taking? Authorizing Provider  albuterol (PROVENTIL  HFA;VENTOLIN HFA) 108 (90 BASE) MCG/ACT inhaler Inhale 2 puffs into the lungs every 4 (four) hours as needed for wheezing. 08/30/14 12/16/16  Netta NeatAlexander J Karamalegos, DO  amoxicillin (AMOXIL) 500 MG capsule Take 1 capsule (500 mg total) by mouth 3 (three) times daily. Patient not taking: Reported on 12/24/2015 11/04/15   Marlon Peliffany Greene, PA-C  aspirin 325 MG tablet Take 325 mg by mouth once.    Historical Provider, MD  ibuprofen (ADVIL,MOTRIN) 200 MG tablet Take 400 mg by mouth every 6 (six) hours as needed for headache, mild pain or moderate pain.    Historical Provider, MD  pantoprazole (PROTONIX) 20 MG tablet Take 1 tablet (20 mg total) by mouth daily. 12/24/15   Danelle BerryLeisa Tapia, PA-C  traMADol (ULTRAM) 50 MG tablet Take 1 tablet (50 mg total) by mouth every 6 (six) hours as needed. Patient not taking: Reported on 12/24/2015 11/04/15   Marlon Peliffany Greene, PA-C    Family History Family History  Problem Relation Age of Onset  . Asthma Mother   . Rheum arthritis Mother   . Arthritis Mother   . Diabetes Father   . Heart disease Father   . Early death Father   . Diabetes Paternal Grandmother   . Heart disease Paternal Grandmother   . Cancer Paternal Grandmother     Social History Social History  Substance Use Topics  . Smoking status: Current Every Day Smoker    Packs/day: 0.50    Years: 8.00    Types: Cigarettes  .  Smokeless tobacco: Never Used     Comment: "thinking about quitting"  . Alcohol use No     Allergies   Review of patient's allergies indicates no known allergies.   Review of Systems Review of Systems  Constitutional: Positive for fever (subjective). Negative for chills.  HENT: Positive for sore throat. Negative for drooling, ear pain and trouble swallowing.   Musculoskeletal: Positive for arthralgias (right shoulder) and neck pain (right sided).  All other systems reviewed and are negative.    Physical Exam Updated Vital Signs BP 122/80 (BP Location: Left Arm)   Pulse 67    Temp 97.9 F (36.6 C) (Oral)   Resp 18   Ht 5\' 3"  (1.6 m)   Wt 193 lb (87.5 kg)   SpO2 99%   BMI 34.19 kg/m   Physical Exam  Constitutional: She is oriented to person, place, and time. She appears well-developed and well-nourished. No distress.  HENT:  Head: Normocephalic and atraumatic.  Right Ear: External ear and ear canal normal. Tympanic membrane is not erythematous, not retracted and not bulging. A middle ear effusion is present.  Left Ear: External ear and ear canal normal. Tympanic membrane is not erythematous, not retracted and not bulging. A middle ear effusion is present.  Mouth/Throat: Uvula is midline and mucous membranes are normal. Dental caries present. No dental abscesses. Oropharyngeal exudate present. No posterior oropharyngeal edema or posterior oropharyngeal erythema.  Right tonsil with one spot of exudate without erythema or edema. Few scattered dental caries with no abscess. Right ear with purulent effusion. No retracted TM or bulging TM. No canal edema or erythema. Left ear with non-purulent effusion. No retracted TM or bulging TM. No canal edema or erythema.   Eyes: EOM are normal.  Neck: Normal range of motion. Neck supple. No spinous process tenderness and no muscular tenderness present. Normal range of motion present.  No C-spine tenderness. FROM of neck. No meningismus.   Cardiovascular: Normal rate.   Pulmonary/Chest: Effort normal. No respiratory distress.  Abdominal: She exhibits no distension.  Musculoskeletal: Normal range of motion. She exhibits no tenderness.  No right trapezius tenderness.  Lymphadenopathy:    She has cervical adenopathy.  Minimal right sided cervical lymphadenopathy.  Neurological: She is alert and oriented to person, place, and time.  Skin: Skin is warm and dry.  Psychiatric: She has a normal mood and affect. Her behavior is normal.  Nursing note and vitals reviewed.    ED Treatments / Results  DIAGNOSTIC STUDIES: Oxygen  Saturation is 99% on RA, nl by my interpretation.    COORDINATION OF CARE: 6:43 PM Discussed treatment plan with pt at bedside which includes rapid strep screen and culture, abx Rx, referral and follow up with dentist, referral and follow up with ENT specialist, referral and follow up with orthopedist, and pt agreed to plan.   Labs (all labs ordered are listed, but only abnormal results are displayed) Labs Reviewed  RAPID STREP SCREEN (NOT AT [facility])  CULTURE, GROUP A STREP [facility])    Procedures Procedures (including critical care time)  Medications Ordered in ED Medications - No data to display   Initial Impression / Assessment and Plan / ED Course  I have reviewed the triage vital signs and the nursing notes.  Pertinent labs that were available during my care of the patient were reviewed by me and considered in my medical decision making (see chart for details).  Clinical Course    Rapid strep obtained in triage negative. Unclear if  pt's symptoms are related. Her neck/back pain seems very localized to her right trapezius. She does have a bilateral ear effusion, purulent on the right. Dental caries but no gross abscess. No evidence of ludwigs or other deep neck space infection. Mild right sided cervical LAD. Will cover with course of augmentin for ear and teeth. She has seen ENT in the past but does not remember who she saw. Encouraged f/u with ENT and ortho. Dental resource guide also given. She is tolerating secretions, afebrile, in NAD. ER return precautions given.  Final Clinical Impressions(s) / ED Diagnoses   Final diagnoses:  Neck pain  Middle ear effusion, right  Dental caries    New Prescriptions Discharge Medication List as of 06/03/2016  7:25 PM    START taking these medications   Details  amoxicillin-clavulanate (AUGMENTIN) 875-125 MG tablet Take 1 tablet by mouth every 12 (twelve) hours., Starting Thu 06/03/2016, Print    methocarbamol (ROBAXIN) 500 MG tablet  Take 1 tablet (500 mg total) by mouth 2 (two) times daily., Starting Thu 06/03/2016, Print    naproxen (NAPROSYN) 500 MG tablet Take 1 tablet (500 mg total) by mouth 2 (two) times daily., Starting Thu 06/03/2016, Print       I personally performed the services described in this documentation, which was scribed in my presence. The recorded information has been reviewed and is accurate.    Carlene Coria, PA-C 06/03/16 2024    Gwyneth Sprout, MD 06/08/16 2023

## 2016-06-03 NOTE — ED Triage Notes (Signed)
Pt reports right sided neck "strain" with upper back pain and sore throat x1 month. Denies injury, chest pain and SOB.

## 2016-06-06 LAB — CULTURE, GROUP A STREP (THRC)

## 2016-09-19 ENCOUNTER — Emergency Department (HOSPITAL_COMMUNITY)
Admission: EM | Admit: 2016-09-19 | Discharge: 2016-09-19 | Disposition: A | Discharge status: Home / Self Care | Payer: No Typology Code available for payment source | Attending: Emergency Medicine | Admitting: Emergency Medicine

## 2016-09-19 ENCOUNTER — Emergency Department (HOSPITAL_COMMUNITY): Payer: No Typology Code available for payment source

## 2016-09-19 ENCOUNTER — Encounter (HOSPITAL_COMMUNITY): Payer: Self-pay

## 2016-09-19 DIAGNOSIS — J069 Acute upper respiratory infection, unspecified: Secondary | ICD-10-CM

## 2016-09-19 DIAGNOSIS — Z87891 Personal history of nicotine dependence: Secondary | ICD-10-CM | POA: Insufficient documentation

## 2016-09-19 DIAGNOSIS — J45909 Unspecified asthma, uncomplicated: Secondary | ICD-10-CM | POA: Insufficient documentation

## 2016-09-19 LAB — BASIC METABOLIC PANEL
Anion gap: 6 (ref 5–15)
BUN: 13 mg/dL (ref 6–20)
CO2: 27 mmol/L (ref 22–32)
CREATININE: 0.79 mg/dL (ref 0.44–1.00)
Calcium: 8.9 mg/dL (ref 8.9–10.3)
Chloride: 105 mmol/L (ref 101–111)
GFR calc Af Amer: >60 mL/min (ref >60)
GLUCOSE: 87 mg/dL (ref 65–99)
POTASSIUM: 3.4 mmol/L — AB (ref 3.5–5.1)
SODIUM: 138 mmol/L (ref 135–145)

## 2016-09-19 LAB — CBC
HEMATOCRIT: 41.5 % (ref 36.0–46.0)
Hemoglobin: 13.9 g/dL (ref 12.0–15.0)
MCH: 30.2 pg (ref 26.0–34.0)
MCHC: 33.5 g/dL (ref 30.0–36.0)
MCV: 90.2 fL (ref 78.0–100.0)
PLATELETS: 246 10*3/uL (ref 150–400)
RBC: 4.6 MIL/uL (ref 3.87–5.11)
RDW: 13.7 % (ref 11.5–15.5)
WBC: 6.3 10*3/uL (ref 4.0–10.5)

## 2016-09-19 LAB — I-STAT TROPONIN, ED: Troponin i, poc: 0 ng/mL (ref 0.00–0.08)

## 2016-09-19 MED ORDER — IPRATROPIUM-ALBUTEROL 0.5-2.5 (3) MG/3ML IN SOLN
3.0000 mL | Freq: Once | RESPIRATORY_TRACT | Status: AC
  Administered 2016-09-19: 3 mL via RESPIRATORY_TRACT
  Filled 2016-09-19: qty 3

## 2016-09-19 MED ORDER — PREDNISONE 20 MG PO TABS: 40.0000 mg | ORAL_TABLET | Freq: Every day | ORAL | 0 refills | Status: DC

## 2016-09-19 MED ORDER — PREDNISONE 20 MG PO TABS
60.0000 mg | ORAL_TABLET | Freq: Once | ORAL | Status: AC
  Administered 2016-09-19: 60 mg via ORAL
  Filled 2016-09-19: qty 3

## 2016-09-19 NOTE — ED Provider Notes (Signed)
[facility]ERG[facility] DEPT Provider Note   CSN: [id number] Arrival date & time: 09/19/16  1514     History   Chief Complaint Chief Complaint  Patient presents with  . Fever  . URI  . Nausea    HPI Monica Sandoval is a 41 y.o. female.  HPI Monica Sandoval is a 41 y.o. female presents to emergency department complaining of cough, congestion, chest tightness, upper respiratory type symptoms. She reports not feeling well approximately 2 weeks ago, but it denies any urinary symptoms at that time. She reports 1 day of flulike symptoms, pressure and fluttering in the chest, report subjective fever, did not check to chart home. She has been together present at home with no relief of her symptoms. She reports history of asthma and has been using her inhaler. She also reports she stopped smoking 1 week ago, currently taking nicotine. Denies any prior heart issues. States "I make sure it's not my heart." Denies any nausea, vomiting. No back pain. No diarrhea. No sick contacts.  Past Medical History:  Diagnosis Date  . Asthma     Patient Active Problem List   Diagnosis Date Noted  . Wheeze 04/17/2015  . Right ankle pain 04/17/2015  . Swelling of hand 11/12/2011  . Discoloration of skin of hand 11/12/2011  . Healthcare maintenance 11/12/2011  . Hearing loss 10/17/2009  . HEMORRHOIDS, INTERNAL, WITH BLEEDING 01/12/2008  . TOBACCO DEPENDENCE 10/20/2006  . TENSION HEADACHE 10/20/2006  . 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 CHOLECYSTECTOMY  2008    OB History    No data available       Home Medications    Prior to Admission medications   Medication Sig Start Date End Date Taking? Authorizing Provider  albuterol (PROVENTIL HFA;VENTOLIN HFA) 108 (90 BASE) MCG/ACT inhaler Inhale 2 puffs into the lungs every 4 (four) hours as needed for wheezing. 08/30/14 12/16/16 Yes Alexander J Karamalegos, DO  ibuprofen (ADVIL,MOTRIN) 200 MG  tablet Take 800 mg by mouth every 6 (six) hours as needed for headache, mild pain or moderate pain.    Yes Historical Provider, MD  omeprazole (PRILOSEC) 20 MG capsule Take 20 mg by mouth daily.   Yes Historical Provider, MD  amoxicillin-clavulanate (AUGMENTIN) 875-125 MG tablet Take 1 tablet by mouth every 12 (twelve) hours. Patient not taking: Reported on 09/19/2016 06/03/16   Ace Gins Sam, PA-C  methocarbamol (ROBAXIN) 500 MG tablet Take 1 tablet (500 mg total) by mouth 2 (two) times daily. Patient not taking: Reported on 09/19/2016 06/03/16   Ace Gins Sam, PA-C  naproxen (NAPROSYN) 500 MG tablet Take 1 tablet (500 mg total) by mouth 2 (two) times daily. Patient not taking: Reported on 09/19/2016 06/03/16   Ace Gins Sam, PA-C  pantoprazole (PROTONIX) 20 MG tablet Take 1 tablet (20 mg total) by mouth daily. Patient not taking: Reported on 09/19/2016 12/24/15   Danelle Berry, PA-C    Family History Family History  Problem Relation Age of Onset  . Asthma Mother   . Rheum arthritis Mother   . Arthritis Mother   . Diabetes Father   . Heart disease Father   . Early death Father   . Diabetes Paternal Grandmother   . Heart disease Paternal Grandmother   . Cancer Paternal Grandmother     Social History Social History  Substance Use Topics  . Smoking status: Former Smoker    Packs/day: 0.50    Years: 8.00    Types:  Cigarettes  . Smokeless tobacco: Never Used     Comment: Sts she quit 09/12/16  . Alcohol use No     Allergies   Patient has no known allergies.   Review of Systems Review of Systems  Constitutional: Positive for chills and fever.  HENT: Positive for congestion and sore throat. Negative for ear pain and hearing loss.   Respiratory: Positive for cough, chest tightness and shortness of breath.   Cardiovascular: Positive for chest pain and palpitations. Negative for leg swelling.  Gastrointestinal: Negative for abdominal pain, diarrhea, nausea and vomiting.  Genitourinary:  Negative for dysuria and flank pain.  Musculoskeletal: Negative for arthralgias, myalgias, neck pain and neck stiffness.  Skin: Negative for rash.  Neurological: Positive for headaches. Negative for dizziness and weakness.  All other systems reviewed and are negative.    Physical Exam Updated Vital Signs BP 116/78   Pulse 62   Temp 98.2 F (36.8 C) (Oral)   Resp 16   SpO2 100%   Physical Exam  Constitutional: She is oriented to person, place, and time. She appears well-developed and well-nourished. No distress.  HENT:  Head: Normocephalic and atraumatic.  Right Ear: Tympanic membrane, external ear and ear canal normal.  Left Ear: Tympanic membrane, external ear and ear canal normal.  Nose: Mucosal edema and rhinorrhea present.  Mouth/Throat: Uvula is midline and mucous membranes are normal. Posterior oropharyngeal erythema present. No oropharyngeal exudate, posterior oropharyngeal edema or tonsillar abscesses.  Eyes: Conjunctivae are normal.  Neck: Neck supple.  Cardiovascular: Normal rate, regular rhythm, normal heart sounds and intact distal pulses.   Pulmonary/Chest: Effort normal. No respiratory distress. She has wheezes. She has no rales.  End expiratory wheezes bilaterally  Abdominal: She exhibits no distension.  Musculoskeletal: Normal range of motion.  Neurological: She is alert and oriented to person, place, and time.  Skin: Skin is warm and dry.  Psychiatric: She has a normal mood and affect.  Nursing note and vitals reviewed.    ED Treatments / Results  Labs (all labs ordered are listed, but only abnormal results are displayed) Labs Reviewed  BASIC METABOLIC PANEL - Abnormal; Notable for the following:       Result Value   Potassium 3.4 (*)    All other components within normal limits  CBC  I-STAT TROPOININ, ED    EKG  EKG Interpretation None       Radiology Dg Chest 2 View  Result Date: 09/19/2016 CLINICAL DATA:  Headache and inferior spur  symptoms 2 weeks. EXAM: CHEST  2 VIEW COMPARISON:  12/24/2015 FINDINGS: Lungs are adequately inflated and otherwise clear. Cardiomediastinal silhouette is within normal. There is minimal degenerative change of the spine. IMPRESSION: No active cardiopulmonary disease. Electronically Signed   By: Elberta Fortis M.D.   On: 09/19/2016 16:14    Procedures Procedures (including critical care time)  Medications Ordered in ED Medications  ipratropium-albuterol (DUONEB) 0.5-2.5 (3) MG/3ML nebulizer solution 3 mL (3 mLs Nebulization Given 09/19/16 1754)  predniSONE (DELTASONE) tablet 60 mg (60 mg Oral Given 09/19/16 1750)     Initial Impression / Assessment and Plan / ED Course  I have reviewed the triage vital signs and the nursing notes.  Pertinent labs & imaging results that were available during my care of the patient were reviewed by me and considered in my medical decision making (see chart for details).   patient in emergency department with upper respiratory type symptoms, also complaining of chest tightness and palpitations. EKG is normal.  Labs obtained by triage, including troponin, are negative. Her vital signs are all within normal. She does have some wheezing on exam, most likely the cause of her chest tightness. Will try a breathing treatment and reassess.   Chest x-ray negative. Patient feels better after beginning treatment, wheezing has resolved. Continue inhaler at home, will give short course of for Nizoral, follow with primary care doctor. Patient has no risk factors for PE, normal vital signs, PERC negative. No risk factors for ACS. Negative ecg and trop. stable for dc home.   Vitals:   09/19/16 1714 09/19/16 1755 09/19/16 1800 09/19/16 1815  BP: 113/80  116/78   Pulse: 66  (!) 58 62  Resp: 12  14 16   Temp:      TempSrc:      SpO2: 100% 100% 100% 100%     Final Clinical Impressions(s) / ED Diagnoses   Final diagnoses:  Upper respiratory tract infection, unspecified type      New Prescriptions Discharge Medication List as of 09/19/2016  7:13 PM    START taking these medications   Details  predniSONE (DELTASONE) 20 MG tablet Take 2 tablets (40 mg total) by mouth daily., Starting Sun 09/19/2016, Print         Jaynie Crumbleatyana Tage Feggins, PA-C 09/19/16 [id number]    Gerhard Munchobert Lockwood, MD 09/20/16 [phone]

## 2016-09-19 NOTE — ED Triage Notes (Signed)
Pt c/o headache and URI symptoms x 2 weeks, chillls/fever x 1 days, and chest "pressure and fluttering" started last night.  Pain score 2/10.  Pt reports taking ibuprofen w/o relief.  Hx of asthma.

## 2016-09-19 NOTE — ED Notes (Signed)
ED Provider at bedside. 

## 2016-09-19 NOTE — ED Notes (Signed)
Respiratory notified of nebulization. 

## 2016-09-19 NOTE — ED Notes (Signed)
Respiratory at bedside.

## 2016-09-19 NOTE — Discharge Instructions (Signed)
Take ibuprofen and Tylenol for any fever or chills, use your inhaler 2 puffs every 3-4 hours. Prednisone as prescribed until all gone. Rest. Increase fluid intake. Follow with primary care doctor.

## 2016-09-19 NOTE — ED Notes (Signed)
Patient was alert, oriented and stable upon discharge. RN went over AVS and patient had no further questions.  

## 2016-10-07 ENCOUNTER — Ambulatory Visit (INDEPENDENT_AMBULATORY_CARE_PROVIDER_SITE_OTHER): Payer: PRIVATE HEALTH INSURANCE | Admitting: Family Medicine

## 2016-10-07 ENCOUNTER — Encounter: Payer: Self-pay | Admitting: Family Medicine

## 2016-10-07 VITALS — BP 116/76 | HR 77 | Temp 97.9°F | Ht 63.0 in | Wt 194.4 lb

## 2016-10-07 DIAGNOSIS — Z23 Encounter for immunization: Secondary | ICD-10-CM

## 2016-10-07 DIAGNOSIS — Z30432 Encounter for removal of intrauterine contraceptive device: Secondary | ICD-10-CM

## 2016-10-07 DIAGNOSIS — N644 Mastodynia: Secondary | ICD-10-CM

## 2016-10-07 DIAGNOSIS — Z3200 Encounter for pregnancy test, result unknown: Secondary | ICD-10-CM

## 2016-10-07 LAB — POCT URINE PREGNANCY: Preg Test, Ur: NEGATIVE

## 2016-10-07 NOTE — Patient Instructions (Signed)
Thank you so much for coming to visit today! Your breast exam was normal today. I have ordered a Mammogram. If normal, we will need to further evaluate this pain if it does not resolve. I recommend combining Ibuprofen with Tylenol to help with the pain. If you develop redness, warmth, or discharge of the breast, please let us know.  Dr. Caroleen Hammanumley

## 2016-10-10 NOTE — Progress Notes (Signed)
Subjective:     Patient ID: Monica Sandoval, female   DOB: 04/08/1976, 41 y.o.   MRN: [id number]  HPI Monica Sandoval is a [age]yo female presenting for breast pain. Reports history of similar pain last month. Current episode started approximately one week ago. Started in left breast, but now involves both breasts--left breast is still more tender than right. Initially believed pain was coming from bra, but has ruled that out. Describes pain as a pulling sensation similar to when she breastfed her children. Denies redness or increased warmth. Denies drainage from nipple, but states her left niple has felt hard, even before the pain started. Denies any palpable masses. Denies any changes in breast size, stating her left breast has always been slightly larger than her right. Denies family history of breast cancer. Has IUD in place for more than 10 years and states she was supposed to have it taken out years ago but has never done so. Denies vaginal bleeding or discharge. Former smoker.  Review of Systems Per HPI    Objective:   Physical Exam  Constitutional: She appears well-developed and well-nourished. No distress.  HENT:  Mouth/Throat: Oropharynx is clear and moist.  Cardiovascular: Normal rate and regular rhythm.   No murmur heard. Pulmonary/Chest: Effort normal. No respiratory distress. She has no wheezes.  Abdominal: Soft. She exhibits no distension. There is no tenderness.  Genitourinary:  Genitourinary Comments: No masses palpable in breasts or axilla bilaterally, no nipple discharge noted  Skin: No rash noted. No erythema.  No increased warmth of breasts noted.   Psychiatric: She has a normal mood and affect. Her behavior is normal.      Assessment and Plan:     1. Breast tenderness Pregnancy test negative. No masses or abnormalities on exam. Will obtain mammogram to further evaluate. May be hormonal given similar episode last month. Continue to monitor and consider diary of symptoms  and/or labs if no improvement. Follow up with PCP.  2. IUD Removal Past due for IUD removal. Removed today without complication. Refuses any other birth control at this time--discussed she still has risk of pregnancy without contraception.

## 2016-10-15 ENCOUNTER — Telehealth: Payer: Self-pay | Admitting: Family Medicine

## 2016-10-15 NOTE — Telephone Encounter (Signed)
Pt is calling to check the status of her request to have a mammogram. Please let her know when this will be possible. jw

## 2016-10-18 NOTE — Telephone Encounter (Signed)
Will forward to Dr. Caroleen Hammanumley who saw patient for this issue.  Erasmo DownerAngela M Johnel Yielding, MD, MPH PGY-3,  [facility] 10/18/2016 8:53 AM

## 2016-10-27 NOTE — Progress Notes (Signed)
   Subjective:   Monica Sandoval is a 41 y.o. female with a history of Tobacco dependence, tension headaches here for breast tenderness, contraception, Pap smear  Left breast tenderness - Pain present intermittently for 2-3 months - [facility] - mammogram ordered, the patient has not been able to schedule this - She is concerned about some skin changes she has noticed on her aereola - Denies nipple drainage - Denies redness or increased warmth -Denies any palpable masses - Denies family history of breast cancer - Pain did get better with Tylenol and ibuprofen for 2-3 days but then recurred - Reports the pain is a pulling pain similar to when she was breast-feeding her child  Pelvic cramping - Occurred last night and felt like previous menstrual cramping -Also associated with dyspareunia -Pain is constant, but Tylenol and ibuprofen did help her sleep last night -IUD was removed on 10/07/16, no bleeding - denies discharge  First Pap smear at 41 years old with abnormal, but repeat was normal  she has had no abnormal Pap smears since that time Last Pap smear was in 2007  Sexually active with one female partner, her husband Desires long-acting reversible contraception Likes her IUD when she had that No MPs since IUD was placed about 7-8 years ago Desires STD check too  Review of Systems:  Per HPI.   Social History: former smoker  Objective:  BP 100/65 (BP Location: Left Arm, Patient Position: Sitting, Cuff Size: Normal)   Pulse 95   Temp 98.3 F (36.8 C) (Oral)   Ht 5\' 3"  (1.6 m)   Wt 194 lb 12.8 oz (88.4 kg)   SpO2 99%   BMI 34.51 kg/m   Gen:  41 y.o. female in NAD  HEENT: NCAT, MMM CV: Regular rate Resp: Non-labored Breasts: breasts appear normal, no suspicious masses, no skin or nipple changes or axillary nodes. Abd: Soft, ND, mildly tender in right lower quadrant and left lower quadrant, BS present, no guarding or  organomegaly GYN:  External genitalia within normal limits.  Vaginal mucosa pink, moist, normal rugae.  Cervix mildly erythematous, thin yellow discharge noted.  Bimanual exam revealed normal, nongravid uterus.  No cervical motion tenderness. No adnexal masses bilaterally.   Ext: WWP, no edema MSK: No obvious deformities, gait intact Neuro: Alert and oriented, speech normal     Assessment & Plan:     Monica Sandoval is a 41 y.o. female here for   Encounter for insertion of intrauterine contraceptive device (IUD) - POCT urine pregnancy - neg - IUD placed - see procedure note  Breast pain Breast exam normal today without suspicious lesions No skin changes noted on exam No signs or symptoms of infection We will obtain diagnostic mammogram and ultrasound of the left side including axilla  Pelvic cramping No worrisome findings on exam STD screening completed today Likely related to resumption of menstruation after IUD removal IUD placed today Advised Tylenol and ibuprofen for comfort  Screen for STD (sexually transmitted disease) - HIV antibody - RPR - POCT Wet Prep Mellody Drown([place]et [place]) - Cervicovaginal ancillary only  Cervical cancer screening - Cytology - PAP   Erasmo DownerAngela M Yadir Zentner, MD MPH PGY-3,  [facility] 10/28/2016  9:46 AM

## 2016-10-27 NOTE — Progress Notes (Signed)
IUD Insertion Procedure Note  Pre-operative Diagnosis: desire for contraception  Post-operative Diagnosis: normal  Indications: contraception  Procedure Details  Urine pregnancy test was done today and result was negative.  The risks (including infection, bleeding, pain, and uterine perforation) and benefits of the procedure were explained to the patient and Written informed consent was obtained.    Cervix cleansed with Betadine. Uterus sounded to 8 cm. IUD inserted without difficulty. String visible and trimmed. Patient tolerated procedure well.  IUD Information: Mirena. Lot # [id number], Expiration: Sept 2020  Condition: Stable  Complications: None  Plan:  The patient was advised to call for any fever or for prolonged or severe pain or bleeding. She was advised to use OTC acetaminophen and OTC ibuprofen as needed for mild to moderate pain.    Erasmo DownerAngela M Woodfin Kiss, MD, MPH PGY-3,  [facility] 10/28/2016 9:01 AM

## 2016-10-28 ENCOUNTER — Other Ambulatory Visit (HOSPITAL_COMMUNITY)
Admission: RE | Admit: 2016-10-28 | Discharge: 2016-10-28 | Disposition: A | Discharge status: Home / Self Care | Payer: No Typology Code available for payment source | Source: Ambulatory Visit | Attending: Family Medicine | Admitting: Family Medicine

## 2016-10-28 ENCOUNTER — Ambulatory Visit (INDEPENDENT_AMBULATORY_CARE_PROVIDER_SITE_OTHER): Payer: PRIVATE HEALTH INSURANCE | Admitting: Family Medicine

## 2016-10-28 VITALS — BP 100/65 | HR 95 | Temp 98.3°F | Ht 63.0 in | Wt 194.8 lb

## 2016-10-28 DIAGNOSIS — Z124 Encounter for screening for malignant neoplasm of cervix: Secondary | ICD-10-CM

## 2016-10-28 DIAGNOSIS — Z113 Encounter for screening for infections with a predominantly sexual mode of transmission: Secondary | ICD-10-CM | POA: Diagnosis not present

## 2016-10-28 DIAGNOSIS — R102 Pelvic and perineal pain unspecified side: Secondary | ICD-10-CM | POA: Insufficient documentation

## 2016-10-28 DIAGNOSIS — Z30430 Encounter for insertion of intrauterine contraceptive device: Secondary | ICD-10-CM | POA: Diagnosis not present

## 2016-10-28 DIAGNOSIS — N644 Mastodynia: Secondary | ICD-10-CM | POA: Diagnosis not present

## 2016-10-28 DIAGNOSIS — Z01419 Encounter for gynecological examination (general) (routine) without abnormal findings: Secondary | ICD-10-CM | POA: Insufficient documentation

## 2016-10-28 DIAGNOSIS — Z1151 Encounter for screening for human papillomavirus (HPV): Secondary | ICD-10-CM | POA: Insufficient documentation

## 2016-10-28 LAB — POCT WET PREP (WET MOUNT)
Clue Cells Wet Prep Whiff POC: NEGATIVE
TRICHOMONAS WET PREP HPF POC: ABSENT

## 2016-10-28 LAB — POCT URINE PREGNANCY: PREG TEST UR: NEGATIVE

## 2016-10-28 MED ORDER — LEVONORGESTREL 20 MCG/24HR IU IUD
INTRAUTERINE_SYSTEM | Freq: Once | INTRAUTERINE | Status: AC
  Administered 2016-10-28: 1 via INTRAUTERINE

## 2016-10-28 NOTE — Assessment & Plan Note (Signed)
Breast exam normal today without suspicious lesions No skin changes noted on exam No signs or symptoms of infection We will obtain diagnostic mammogram and ultrasound of the left side including axilla

## 2016-10-28 NOTE — Assessment & Plan Note (Addendum)
No worrisome findings on exam STD screening completed today Likely related to resumption of menstruation after IUD removal IUD placed today Advised Tylenol and ibuprofen for comfort

## 2016-10-28 NOTE — Patient Instructions (Signed)

## 2016-10-29 ENCOUNTER — Telehealth: Payer: Self-pay

## 2016-10-29 LAB — HIV ANTIBODY (ROUTINE TESTING W REFLEX): HIV 1&2 Ab, 4th Generation: NONREACTIVE

## 2016-10-29 LAB — RPR

## 2016-10-29 NOTE — Telephone Encounter (Signed)
Called patient on her husband Jaime's phone. She told me that her phone had been damaged and that it was ok to leave a message on her husbands number.  The appointment for her diagnostic mammogram and ultrasound is Tuesday 13 March at 250 pm with a check in of 230.Glennie HawkSimpson, Michelle R

## 2016-11-01 LAB — CERVICOVAGINAL ANCILLARY ONLY
Chlamydia: NEGATIVE
Neisseria Gonorrhea: NEGATIVE
Trichomonas: NEGATIVE

## 2016-11-01 LAB — CYTOLOGY - PAP
DIAGNOSIS: NEGATIVE
HPV (WINDOPATH): NOT DETECTED

## 2016-11-02 ENCOUNTER — Other Ambulatory Visit: Payer: PRIVATE HEALTH INSURANCE

## 2016-11-03 ENCOUNTER — Telehealth: Payer: Self-pay | Admitting: *Deleted

## 2016-11-03 NOTE — Telephone Encounter (Signed)
Patient informed of normal results.

## 2016-11-03 NOTE — Telephone Encounter (Signed)
-----   Message from Erasmo DownerAngela M Bacigalupo, MD sent at 11/01/2016  8:29 AM EDT ----- Please let patient know that all STD tests are negative. Thanks!  Erasmo DownerAngela M Bacigalupo, MD, MPH PGY-3,  [facility] 11/01/2016 8:29 AM

## 2016-11-05 ENCOUNTER — Ambulatory Visit
Admission: RE | Admit: 2016-11-05 | Discharge: 2016-11-05 | Disposition: A | Discharge status: Home / Self Care | Payer: PRIVATE HEALTH INSURANCE | Source: Ambulatory Visit | Attending: Family Medicine | Admitting: Family Medicine

## 2016-11-05 DIAGNOSIS — N644 Mastodynia: Secondary | ICD-10-CM

## 2016-11-08 ENCOUNTER — Encounter: Payer: Self-pay | Admitting: Family Medicine

## 2017-02-28 ENCOUNTER — Encounter (HOSPITAL_COMMUNITY): Payer: Self-pay | Admitting: Emergency Medicine

## 2017-02-28 ENCOUNTER — Telehealth: Payer: Self-pay | Admitting: *Deleted

## 2017-02-28 ENCOUNTER — Emergency Department (HOSPITAL_COMMUNITY): Payer: No Typology Code available for payment source

## 2017-02-28 ENCOUNTER — Emergency Department (HOSPITAL_COMMUNITY)
Admission: EM | Admit: 2017-02-28 | Discharge: 2017-02-28 | Disposition: A | Discharge status: Home / Self Care | Payer: No Typology Code available for payment source | Attending: Emergency Medicine | Admitting: Emergency Medicine

## 2017-02-28 DIAGNOSIS — J4521 Mild intermittent asthma with (acute) exacerbation: Secondary | ICD-10-CM

## 2017-02-28 DIAGNOSIS — R0789 Other chest pain: Secondary | ICD-10-CM | POA: Diagnosis not present

## 2017-02-28 DIAGNOSIS — Z720 Tobacco use: Secondary | ICD-10-CM

## 2017-02-28 DIAGNOSIS — J440 Chronic obstructive pulmonary disease with acute lower respiratory infection: Secondary | ICD-10-CM

## 2017-02-28 DIAGNOSIS — Z79899 Other long term (current) drug therapy: Secondary | ICD-10-CM | POA: Insufficient documentation

## 2017-02-28 DIAGNOSIS — F17210 Nicotine dependence, cigarettes, uncomplicated: Secondary | ICD-10-CM | POA: Insufficient documentation

## 2017-02-28 DIAGNOSIS — J209 Acute bronchitis, unspecified: Secondary | ICD-10-CM

## 2017-02-28 LAB — CBC
HEMATOCRIT: 41 % (ref 36.0–46.0)
HEMOGLOBIN: 14.5 g/dL (ref 12.0–15.0)
MCH: 31.4 pg (ref 26.0–34.0)
MCHC: 35.4 g/dL (ref 30.0–36.0)
MCV: 88.7 fL (ref 78.0–100.0)
Platelets: 212 10*3/uL (ref 150–400)
RBC: 4.62 MIL/uL (ref 3.87–5.11)
RDW: 14 % (ref 11.5–15.5)
WBC: 9 10*3/uL (ref 4.0–10.5)

## 2017-02-28 LAB — BASIC METABOLIC PANEL
ANION GAP: 6 (ref 5–15)
BUN: 8 mg/dL (ref 6–20)
CHLORIDE: 107 mmol/L (ref 101–111)
CO2: 25 mmol/L (ref 22–32)
Calcium: 8.6 mg/dL — ABNORMAL LOW (ref 8.9–10.3)
Creatinine, Ser: 0.79 mg/dL (ref 0.44–1.00)
GFR calc Af Amer: >60 mL/min (ref >60)
GFR calc non Af Amer: >60 mL/min (ref >60)
GLUCOSE: 87 mg/dL (ref 65–99)
POTASSIUM: 3.9 mmol/L (ref 3.5–5.1)
Sodium: 138 mmol/L (ref 135–145)

## 2017-02-28 LAB — I-STAT TROPONIN, ED: Troponin i, poc: 0.01 ng/mL (ref 0.00–0.08)

## 2017-02-28 MED ORDER — IPRATROPIUM-ALBUTEROL 0.5-2.5 (3) MG/3ML IN SOLN
3.0000 mL | Freq: Once | RESPIRATORY_TRACT | Status: AC
Start: 2017-02-28 — End: 2017-02-28
  Administered 2017-02-28: 3 mL via RESPIRATORY_TRACT
  Filled 2017-02-28: qty 3

## 2017-02-28 MED ORDER — AEROCHAMBER PLUS FLO-VU MEDIUM MISC
1.0000 | Freq: Once | Status: AC
  Administered 2017-02-28: 1
  Filled 2017-02-28: qty 1

## 2017-02-28 MED ORDER — METHYLPREDNISOLONE SODIUM SUCC 125 MG IJ SOLR
125.0000 mg | Freq: Once | INTRAMUSCULAR | Status: AC
  Administered 2017-02-28: 125 mg via INTRAVENOUS
  Filled 2017-02-28: qty 2

## 2017-02-28 MED ORDER — ALBUTEROL SULFATE HFA 108 (90 BASE) MCG/ACT IN AERS
1.0000 | INHALATION_SPRAY | RESPIRATORY_TRACT | Status: DC | PRN
  Administered 2017-02-28: 2 via RESPIRATORY_TRACT
  Filled 2017-02-28: qty 6.7

## 2017-02-28 MED ORDER — PREDNISONE 10 MG (21) PO TBPK: ORAL_TABLET | ORAL | 0 refills | Status: DC

## 2017-02-28 MED ORDER — KETOROLAC TROMETHAMINE 30 MG/ML IJ SOLN
30.0000 mg | Freq: Once | INTRAMUSCULAR | Status: AC
  Administered 2017-02-28: 30 mg via INTRAVENOUS
  Filled 2017-02-28: qty 1

## 2017-02-28 MED ORDER — ALBUTEROL SULFATE HFA 108 (90 BASE) MCG/ACT IN AERS: 2.0000 | INHALATION_SPRAY | RESPIRATORY_TRACT | 1 refills | Status: DC | PRN

## 2017-02-28 NOTE — Discharge Instructions (Signed)
Stop smoking

## 2017-02-28 NOTE — ED Provider Notes (Signed)
[facility]-EMERGENCY DEPT Provider Note   CSN: [id number] Arrival date & time: 02/28/17  1624     History   Chief Complaint Chief Complaint  Patient presents with  . Chest Pain    HPI Monica Sandoval is a 41 y.o. female.  Pt presents to the ED today with chest pain that has been going on for the past couple of months.  The pt said she has some pain that radiates to her left arm.  Pt denies any f/c.      Past Medical History:  Diagnosis Date  . Asthma     Patient Active Problem List   Diagnosis Date Noted  . Breast pain 10/28/2016  . Pelvic cramping 10/28/2016  . Wheeze 04/17/2015  . Right ankle pain 04/17/2015  . Swelling of hand 11/12/2011  . Discoloration of skin of hand 11/12/2011  . Healthcare maintenance 11/12/2011  . Hearing loss 10/17/2009  . HEMORRHOIDS, INTERNAL, WITH BLEEDING 01/12/2008  . TOBACCO DEPENDENCE 10/20/2006  . TENSION HEADACHE 10/20/2006  . 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 CHOLECYSTECTOMY  2008    OB History    No data available       Home Medications    Prior to Admission medications   Medication Sig Start Date End Date Taking? Authorizing Provider  esomeprazole (NEXIUM) 40 MG capsule Take 40 mg by mouth daily at 12 noon.   Yes [provider]  ibuprofen (ADVIL,MOTRIN) 200 MG tablet Take 800 mg by mouth every 6 (six) hours as needed for headache, mild pain or moderate pain.    Yes [provider]  albuterol (PROVENTIL HFA;VENTOLIN HFA) 108 (90 Base) MCG/ACT inhaler Inhale 2 puffs into the lungs every 4 (four) hours as needed for wheezing. 02/28/17 06/17/19  Jacalyn Lefevre, MD  amoxicillin-clavulanate (AUGMENTIN) 875-125 MG tablet Take 1 tablet by mouth every 12 (twelve) hours. Patient not taking: Reported on 09/19/2016 06/03/16   Sam, Ace Gins, PA-C  predniSONE (STERAPRED UNI-PAK 21 TAB) 10 MG (21) TBPK tablet Take 6 tabs by mouth daily  for 2 days, then 5  tabs for 2 days, then 4 tabs for 2 days, then 3 tabs for 2 days, 2 tabs for 2 days, then 1 tab by mouth daily for 2 days 02/28/17   Jacalyn Lefevre, MD    Family History Family History  Problem Relation Age of Onset  . Asthma Mother   . Rheum arthritis Mother   . Arthritis Mother   . Diabetes Father   . Heart disease Father   . Early death Father   . Diabetes Paternal Grandmother   . Heart disease Paternal Grandmother   . Cancer Paternal Grandmother     Social History Social History  Substance Use Topics  . Smoking status: Current Every Day Smoker    Packs/day: 0.50    Years: 8.00    Types: Cigarettes  . Smokeless tobacco: Never Used     Comment: Sts she quit 09/12/16  . Alcohol use No     Allergies   Patient has no known allergies.   Review of Systems Review of Systems  Respiratory: Positive for wheezing.   Cardiovascular: Positive for chest pain.  All other systems reviewed and are negative.    Physical Exam Updated Vital Signs BP 127/85 (BP Location: Left Arm)   Pulse 71   Temp 97.7 F (36.5 C) (Oral)   Resp 20   Ht 5\' 4"  (1.626 m)   Wt  88 kg (194 lb)   SpO2 100%   BMI 33.30 kg/m   Physical Exam  Constitutional: She is oriented to person, place, and time. She appears well-developed and well-nourished.  HENT:  Head: Normocephalic and atraumatic.  Right Ear: External ear normal.  Left Ear: External ear normal.  Nose: Nose normal.  Mouth/Throat: Oropharynx is clear and moist.  Eyes: Conjunctivae and EOM are normal. Pupils are equal, round, and reactive to light.  Neck: Normal range of motion. Neck supple.  Cardiovascular: Normal rate, regular rhythm, normal heart sounds and intact distal pulses.   Pulmonary/Chest: She has wheezes.  Abdominal: Soft. Bowel sounds are normal.  Musculoskeletal: Normal range of motion.  Neurological: She is alert and oriented to person, place, and time.  Skin: Skin is warm.  Psychiatric: She has a normal mood and affect.  Her behavior is normal. Judgment and thought content normal.  Nursing note and vitals reviewed.    ED Treatments / Results  Labs (all labs ordered are listed, but only abnormal results are displayed) Labs Reviewed  BASIC METABOLIC PANEL - Abnormal; Notable for the following:       Result Value   Calcium 8.6 (*)    All other components within normal limits  CBC  I-STAT TROPOININ, ED    EKG  EKG Interpretation  Date/Time:  Monday February 28 2017 16:33:37 EDT Ventricular Rate:  81 PR Interval:    QRS Duration: 74 QT Interval:  347 QTC Calculation: 403 R Axis:   73 Text Interpretation:  Sinus rhythm Confirmed by Jacalyn Lefevre [id number] on 02/28/2017 9:34:32 PM       Radiology Dg Chest 2 View  Result Date: 02/28/2017 CLINICAL DATA:  Left chest pain radiating to the left fingers EXAM: CHEST  2 VIEW COMPARISON:  September 19 2016 FINDINGS: The heart size and mediastinal contours are within normal limits. There is no focal infiltrate, pulmonary edema, or pleural effusion. Mild degenerative joint changes of the spine are noted. IMPRESSION: No active cardiopulmonary disease. Electronically Signed   By: Sherian Rein M.D.   On: 02/28/2017 17:34    Procedures Procedures (including critical care time)  Medications Ordered in ED Medications  albuterol (PROVENTIL HFA;VENTOLIN HFA) 108 (90 Base) MCG/ACT inhaler 1-2 puff (2 puffs Inhalation Given 02/28/17 2245)  AEROCHAMBER PLUS FLO-VU MEDIUM MISC 1 each (1 each Other Refused 02/28/17 2245)  methylPREDNISolone sodium succinate (SOLU-MEDROL) 125 mg/2 mL injection 125 mg (125 mg Intravenous Given 02/28/17 2156)  ipratropium-albuterol (DUONEB) 0.5-2.5 (3) MG/3ML nebulizer solution 3 mL (3 mLs Nebulization Given 02/28/17 2157)  ketorolac (TORADOL) 30 MG/ML injection 30 mg (30 mg Intravenous Given 02/28/17 2156)     Initial Impression / Assessment and Plan / ED Course  I have reviewed the triage vital signs and the nursing notes.  Pertinent labs &  imaging results that were available during my care of the patient were reviewed by me and considered in my medical decision making (see chart for details).    Pt is feeling much better.  CP is gone and wheezing is gone.  She is given an inhaler with spacer.  She is encouraged to stop smoking.  She knows to return if worse and f/u with pcp.  Final Clinical Impressions(s) / ED Diagnoses   Final diagnoses:  Atypical chest pain  Mild intermittent asthma with exacerbation  Tobacco abuse    New Prescriptions New Prescriptions   PREDNISONE (STERAPRED UNI-PAK 21 TAB) 10 MG (21) TBPK TABLET    Take 6 tabs by  mouth daily  for 2 days, then 5 tabs for 2 days, then 4 tabs for 2 days, then 3 tabs for 2 days, 2 tabs for 2 days, then 1 tab by mouth daily for 2 days     Jacalyn LefevreHaviland, Lavelle Berland, MD 02/28/17 2253

## 2017-02-28 NOTE — Telephone Encounter (Signed)
Patient called nurse clinic today reporting chest pain, left arm pain for about 2 weeks.  Pt stated the pain is not going away and very painful.  Patient advised that she needed to be seen today at the ED for evaluation today.  Patient stated that she wanted to be seen in clinic, but advised patient if the pain is severe and lasting two weeks then the ED is best place for her.  They can do all the blood work and imaging that needed to be done there.  Will forward to PCP for further advise.  Clovis PuMartin, Tamika L, RN

## 2017-02-28 NOTE — ED Triage Notes (Signed)
Patient c/o central chest pain that has been constant over the past couple days. Patient states that pain radiates left arm.

## 2017-05-02 ENCOUNTER — Encounter: Payer: Self-pay | Admitting: Family Medicine

## 2017-05-02 ENCOUNTER — Other Ambulatory Visit (HOSPITAL_COMMUNITY)
Admission: RE | Admit: 2017-05-02 | Discharge: 2017-05-02 | Disposition: A | Discharge status: Home / Self Care | Payer: No Typology Code available for payment source | Source: Ambulatory Visit | Attending: Family Medicine | Admitting: Family Medicine

## 2017-05-02 ENCOUNTER — Ambulatory Visit (INDEPENDENT_AMBULATORY_CARE_PROVIDER_SITE_OTHER): Payer: No Typology Code available for payment source | Admitting: Family Medicine

## 2017-05-02 VITALS — BP 120/80 | HR 80 | Temp 98.2°F | Ht 63.5 in | Wt 191.0 lb

## 2017-05-02 DIAGNOSIS — Z0000 Encounter for general adult medical examination without abnormal findings: Secondary | ICD-10-CM

## 2017-05-02 DIAGNOSIS — F320 Major depressive disorder, single episode, mild: Secondary | ICD-10-CM

## 2017-05-02 DIAGNOSIS — Z202 Contact with and (suspected) exposure to infections with a predominantly sexual mode of transmission: Secondary | ICD-10-CM | POA: Diagnosis present

## 2017-05-02 DIAGNOSIS — F17200 Nicotine dependence, unspecified, uncomplicated: Secondary | ICD-10-CM

## 2017-05-02 DIAGNOSIS — Z23 Encounter for immunization: Secondary | ICD-10-CM

## 2017-05-02 DIAGNOSIS — R5383 Other fatigue: Secondary | ICD-10-CM | POA: Diagnosis not present

## 2017-05-02 LAB — POCT URINALYSIS DIP (MANUAL ENTRY)
BILIRUBIN UA: NEGATIVE
BILIRUBIN UA: NEGATIVE mg/dL
GLUCOSE UA: NEGATIVE mg/dL
Leukocytes, UA: NEGATIVE
Nitrite, UA: NEGATIVE
PH UA: 7 (ref 5.0–8.0)
Protein Ur, POC: NEGATIVE mg/dL
RBC UA: NEGATIVE
Spec Grav, UA: 1.01 (ref 1.010–1.025)
Urobilinogen, UA: 0.2 E.U./dL

## 2017-05-02 LAB — POCT GLYCOSYLATED HEMOGLOBIN (HGB A1C): Hemoglobin A1C: 5.1

## 2017-05-02 MED ORDER — NICOTINE 7 MG/24HR TD PT24: 7.0000 mg | MEDICATED_PATCH | Freq: Every day | TRANSDERMAL | Status: DC

## 2017-05-02 NOTE — Patient Instructions (Addendum)
It was great to meet you today! Thank you for letting me participate in your care!  Today, we discussed your tobacco use disorder, depression, and getting routine health maintenance.  For your tobacco use disorder we will start you on nicotine patches. For your depression we will schedule you with behavioral health. We will revisit starting you on a depressive medication at your next visit.  I also got multiple labs for you to check your cholesterol, blood sugar, rheumatoid factor, and thyroid function. I will call you if any of your tests are abnormal. I will send you a letter if your results are normal.  I will   Be well, Jules Schickim Margrett Kalb, DO PGY-1, [facility]

## 2017-05-02 NOTE — Progress Notes (Signed)
Subjective: No chief complaint on file.    HPI: Monica Sandoval is a 41 y.o. presenting to clinic today to discuss the following:  1) Routine health and physical 2) Concern for STD exposure 3) Depression 4) Fatigue    [name] is a 41y/o female with a PMH of tobacco use disorder, tension headaches, and pelvic pain presents today for routine health maintenance. She is due for her influenza and tetanus shots. She is also due for a lipid panel, HbA1c, and CMP. She has a recent CBC from a visit to the ED.  She states it is her belief that her husband has "stepped out on her" with another woman and she believes she is at risk for an STD. She denies any discharge, malodor, pain, burning, rash, fever, but does endorse increase in urinary frequency without burning on urination. She has had intermittent nausea and diarrhea for 3 weeks.  During our visit, we discussed her home situation and how it was effecting her life and she stated that she has felt depressed "for many years but she has never told anyone". She it also interested in quitting smoking. She would like to try the patch and was unsure of starting depression medication at this time. She endorses more than 6 months of feeling depressed, lack of motivation or interest, lack of energy, decreased sleep, and finds it harder to concentrate and remember things.  She also states her mom had developed hypothyroid around this age and she has been feeling chronic fatigue. She has requested her TSH be checked and states "I want to pay for it" as she states she knows her insurance will not cover it. She denies acute weight gain, cold intolerance, any enlargement of her thyroid gland, or abdominal upset due to constipation.    Health Maintenance: influenza and tetanus     ROS noted in HPI.   Past Medical, Surgical, Social, and Family History Reviewed & Updated per EMR.   Pertinent Historical Findings include:   History    Smoking Status  . Current Every Day Smoker  . Packs/day: 0.50  . Years: 8.00  . Types: Cigarettes  Smokeless Tobacco  . Never Used    Comment: Sts she quit 09/12/16      Objective: BP 120/80 (BP Location: Left Arm, Patient Position: Sitting, Cuff Size: Large)   Pulse 80   Temp 98.2 F (36.8 C) (Oral)   Ht 5' 3.5" (1.613 m)   Wt 191 lb (86.6 kg)   SpO2 98%   BMI 33.30 kg/m  Vitals and nursing notes reviewed  Physical Exam  Gen: Alert and Oriented x 3, NAD HEENT: Normocephalic, atraumatic, PERRLA, EOMI, TM visible with good light reflex, non-swollen, non-erythematous turbinates, normal pharyngeal mucosa Neck: trachea midline, no thyroidmegaly, no LAD Resp: CTAB, no wheezing, rales, or rhonchi, comfortable work of breathing CV: RRR, no murmurs, normal S1, S2 split, +2 pulses dorsalis pedis bilaterally Abd: non-distended, non-tender, soft, +bs in all four quadrants, no hepatosplenomegaly MSK: FROM in all four extremities Ext: no clubbing, cyanosis, or edema Neuro: CN II-XII intact, no focal or gross deficits Psych: appropriate behavior, mood, denies any suicidal ideation   Results for orders placed or performed in visit on 05/02/17 (from the past 72 hour(s))  Lipid Panel     Status: Abnormal   Collection Time: 05/02/17  4:15 PM  Result Value Ref Range   Cholesterol, Total 167 100 - 199 mg/dL   Triglycerides 65 0 - 149 mg/dL  HDL 54 >39 mg/dL   VLDL Cholesterol Cal 13 5 - 40 mg/dL   LDL Calculated 841100 (H) 0 - 99 mg/dL   Chol/HDL Ratio 3.1 0.0 - 4.4 ratio    Comment:                                   T. Chol/HDL Ratio                                             Men  Women                               1/2 Avg.Risk  3.4    3.3                                   Avg.Risk  5.0    4.4                                2X Avg.Risk  9.6    7.1                                3X Avg.Risk 23.4   11.0   CMP and Liver     Status: None   Collection Time: 05/02/17  4:15 PM  Result  Value Ref Range   Glucose 75 65 - 99 mg/dL   BUN 7 6 - 24 mg/dL   Creatinine, Ser 3.240.78 0.57 - 1.00 mg/dL   GFR calc non Af Amer 95 >59 mL/min/1.73   GFR calc Af Amer 109 >59 mL/min/1.73   Sodium 144 134 - 144 mmol/L   Potassium 4.1 3.5 - 5.2 mmol/L   Chloride 103 96 - 106 mmol/L   CO2 24 20 - 29 mmol/L   Calcium 9.4 8.7 - 10.2 mg/dL   Total Protein 6.8 6.0 - 8.5 g/dL   Albumin 4.7 3.5 - 5.5 g/dL   Bilirubin Total 0.4 0.0 - 1.2 mg/dL   Bilirubin, Direct 4.010.12 0.00 - 0.40 mg/dL   Alkaline Phosphatase 81 39 - 117 IU/L   AST 13 0 - 40 IU/L   ALT 11 0 - 32 IU/L  Rheumatoid factor     Status: None   Collection Time: 05/02/17  4:15 PM  Result Value Ref Range   Rhuematoid fact SerPl-aCnc <10.0 0.0 - 13.9 IU/mL  POCT urinalysis dipstick     Status: None   Collection Time: 05/02/17  4:35 PM  Result Value Ref Range   Color, UA yellow yellow   Clarity, UA clear clear   Glucose, UA negative negative mg/dL   Bilirubin, UA negative negative   Ketones, POC UA negative negative mg/dL   Spec Grav, UA 0.2721.010 5.3661.010 - 1.025   Blood, UA negative negative   pH, UA 7.0 5.0 - 8.0   Protein Ur, POC negative negative mg/dL   Urobilinogen, UA 0.2 0.2 or 1.0 E.U./dL   Nitrite, UA Negative Negative   Leukocytes, UA Negative Negative  HgB A1c     Status: None   Collection Time: 05/02/17  5:05 PM  Result Value Ref Range   Hemoglobin A1C 5.1     Assessment/Plan:  TOBACCO DEPENDENCE Patient wants to quit smoking and is down to 3-5 cigarettes per day. She is interested in starting the nicotine patch and does not like the gum. Starting her on  patch and will follow plan to wean off.  Additionally, consider starting Buproprion at next visit if the patch is not helping her much as this would also help her lose weight and treat her suspected depression.  STD exposure Patient believes she has been exposed to STD potentially. Did urine test for GC/CC today due to lack of symptoms, no fever, and no  specific findings on physical exam. However, if patient does develop symptoms or still has concerns consider getting swab at next visit.  Routine general medical examination at a health care facility Patient has not had lipid panel/cmet in over one year. Also due to patient request she wanted RF factor checked due to pain in her hands. The pain she has is not typical of Rheumatoid Arthritis but patient requested test and stated "she wants to pay for it".  Also got influenza shot and tetanus shot today for routine health maintenance.   Current mild episode of major depressive disorder without prior episode [facility]) Patient stated today she has felt depressed "for many years" yet she has refused to come forward about her symptoms before. She had more than 5 symptoms and endorsed feeling depressed for more than 1 month. It is likely her concerns about her marital situation at this time is also effecting her current mood. Due to not being able to pin down exact start of these specific symptoms I will hold medication at this time.  She was given contact information for behavioral health and stated she would return for follow up with them. I believe she would benefit from seeing them.  I will follow up with her in one month to see if she has made any improvements. If she is still having symptoms start Buproprion as it will help her lose weight and help her in her efforts to stop smoking.   Other fatigue Patient has long standing non-specific fatigue. Due to her wanting a TSH and a positive family history I ordered TSH to rule out thyroid although I explained to the patient given her lack of other symptoms it is highly unlikely it is the cause of her fatigue. She understood and stated she wanted to pay for the test so she would know.  Likely her fatigue is multifactorial as patient is not getting good sleep and is suffering from depression.    Please see problem based Assessment and Plan PATIENT EDUCATION  PROVIDED: See AVS    Diagnosis and plan along with any newly prescribed medication(s) were discussed in detail with this patient today. The patient verbalized understanding and agreed with the plan. Patient advised if symptoms worsen return to clinic or ER.   Health Maintainance:   Orders Placed This Encounter  Procedures  . Flu Vaccine QUAD 36+ mos IM  . Tdap vaccine greater than or equal to 7yo IM  . Lipid Panel  . CMP and Liver  . Rheumatoid factor  . POCT urinalysis dipstick  . HgB A1c    Meds ordered this encounter  Medications  . nicotine (NICODERM CQ - dosed in mg/24 hr) patch 7 mg     Jules Schick, DO 05/02/2017, 2:50 PM PGY-1, [facility]

## 2017-05-03 DIAGNOSIS — Z23 Encounter for immunization: Secondary | ICD-10-CM | POA: Insufficient documentation

## 2017-05-03 DIAGNOSIS — R5383 Other fatigue: Secondary | ICD-10-CM | POA: Insufficient documentation

## 2017-05-03 LAB — CMP AND LIVER
ALBUMIN: 4.7 g/dL (ref 3.5–5.5)
ALT: 11 IU/L (ref 0–32)
AST: 13 IU/L (ref 0–40)
Alkaline Phosphatase: 81 IU/L (ref 39–117)
BILIRUBIN TOTAL: 0.4 mg/dL (ref 0.0–1.2)
BUN: 7 mg/dL (ref 6–24)
Bilirubin, Direct: 0.12 mg/dL (ref 0.00–0.40)
CALCIUM: 9.4 mg/dL (ref 8.7–10.2)
CHLORIDE: 103 mmol/L (ref 96–106)
CO2: 24 mmol/L (ref 20–29)
CREATININE: 0.78 mg/dL (ref 0.57–1.00)
GFR calc Af Amer: 109 mL/min/{1.73_m2} (ref >59)
GFR, EST NON AFRICAN AMERICAN: 95 mL/min/{1.73_m2} (ref >59)
GLUCOSE: 75 mg/dL (ref 65–99)
Potassium: 4.1 mmol/L (ref 3.5–5.2)
Sodium: 144 mmol/L (ref 134–144)
Total Protein: 6.8 g/dL (ref 6.0–8.5)

## 2017-05-03 LAB — RHEUMATOID FACTOR: Rhuematoid fact SerPl-aCnc: <10 IU/mL (ref 0.0–13.9)

## 2017-05-03 LAB — LIPID PANEL
CHOL/HDL RATIO: 3.1 ratio (ref 0.0–4.4)
Cholesterol, Total: 167 mg/dL (ref 100–199)
HDL: 54 mg/dL (ref >39)
LDL Calculated: 100 mg/dL — ABNORMAL HIGH (ref 0–99)
Triglycerides: 65 mg/dL (ref 0–149)
VLDL CHOLESTEROL CAL: 13 mg/dL (ref 5–40)

## 2017-05-03 NOTE — Assessment & Plan Note (Addendum)
Patient has not had lipid panel/cmet in over one year. Also due to patient request she wanted RF factor checked due to pain in her hands. The pain she has is not typical of Rheumatoid Arthritis but patient requested test and stated "she wants to pay for it".  Also got influenza shot and tetanus shot today for routine health maintenance.

## 2017-05-03 NOTE — Assessment & Plan Note (Signed)
Patient believes she has been exposed to STD potentially. Did urine test for GC/CC today due to lack of symptoms, no fever, and no specific findings on physical exam. However, if patient does develop symptoms or still has concerns consider getting swab at next visit.

## 2017-05-03 NOTE — Assessment & Plan Note (Signed)
Patient wants to quit smoking and is down to 3-5 cigarettes per day. She is interested in starting the nicotine patch and does not like the gum. Starting her on  patch and will follow plan to wean off.  Additionally, consider starting Buproprion at next visit if the patch is not helping her much as this would also help her lose weight and treat her suspected depression.

## 2017-05-03 NOTE — Assessment & Plan Note (Signed)
Patient has long standing non-specific fatigue. Due to her wanting a TSH and a positive family history I ordered TSH to rule out thyroid although I explained to the patient given her lack of other symptoms it is highly unlikely it is the cause of her fatigue. She understood and stated she wanted to pay for the test so she would know.  Likely her fatigue is multifactorial as patient is not getting good sleep and is suffering from depression.

## 2017-05-03 NOTE — Assessment & Plan Note (Addendum)
Patient stated today she has felt depressed "for many years" yet she has refused to come forward about her symptoms before. She had more than 5 symptoms and endorsed feeling depressed for more than 1 month. It is likely her concerns about her marital situation at this time is also effecting her current mood. Due to not being able to pin down exact start of these specific symptoms I will hold medication at this time.  She was given contact information for behavioral health and stated she would return for follow up with them. I believe she would benefit from seeing them.  I will follow up with her in one month to see if she has made any improvements. If she is still having symptoms start Buproprion as it will help her lose weight and help her in her efforts to stop smoking.

## 2017-05-04 LAB — URINE CYTOLOGY ANCILLARY ONLY
Chlamydia: NEGATIVE
Neisseria Gonorrhea: NEGATIVE

## 2017-05-11 ENCOUNTER — Encounter: Payer: Self-pay | Admitting: Family Medicine

## 2017-05-11 NOTE — Progress Notes (Signed)
Sent letter to patient informing them of normal lab results from last visit. Can discuss with patient at next visit if desired or informed patient via letter she can call me with questions if she has any before her next appointment.  Jules Schick, DO PGY-1, [facility]

## 2017-06-02 ENCOUNTER — Ambulatory Visit: Payer: No Typology Code available for payment source | Admitting: Family Medicine

## 2017-06-16 ENCOUNTER — Ambulatory Visit: Payer: No Typology Code available for payment source | Admitting: Family Medicine

## 2017-09-21 ENCOUNTER — Encounter: Payer: Self-pay | Admitting: Student in an Organized Health Care Education/Training Program

## 2017-09-21 ENCOUNTER — Ambulatory Visit (INDEPENDENT_AMBULATORY_CARE_PROVIDER_SITE_OTHER): Payer: PRIVATE HEALTH INSURANCE | Admitting: Student in an Organized Health Care Education/Training Program

## 2017-09-21 ENCOUNTER — Other Ambulatory Visit: Payer: Self-pay

## 2017-09-21 VITALS — BP 102/60 | HR 80 | Temp 98.2°F | Ht 63.5 in | Wt 199.4 lb

## 2017-09-21 DIAGNOSIS — M25512 Pain in left shoulder: Secondary | ICD-10-CM

## 2017-09-21 DIAGNOSIS — M25562 Pain in left knee: Secondary | ICD-10-CM | POA: Insufficient documentation

## 2017-09-21 DIAGNOSIS — G8929 Other chronic pain: Secondary | ICD-10-CM | POA: Diagnosis not present

## 2017-09-21 NOTE — Progress Notes (Signed)
CC: Left shoulder pain, left knee pain  HPI: Monica Sandoval is a 42 y.o. female with PMH significant for  who [facility] with shoulder pain and knee pain.    Shoulder pain Shoulder pain of a couple years duration. Previously worked up in 2014 with MRI shoulder that shows glenohumeral ligament tear. Patient does not recall going to PT. Pain has been persistent since that time, however arm started going numb from this over the weekend, prompting her to come in. Pain radiates to arm, arm goes numb, 3rd and 4th fingers go numb. Takes ibuprofen. She had a cortisone shot in the distant past that did not help with the pain.  Knee Knee swelled up in December, hears crunching, and pain shoots down leg and foot cramping. Pain is worsening. Knee cap caught. No redness or warmth. Ibuprofen.  Review of Symptoms:  See HPI for ROS.   CC, SH/smoking status, and VS noted.  Objective: BP 102/60   Pulse 80   Temp 98.2 F (36.8 C) (Oral)   Ht 5' 3.5" (1.613 m)   Wt 199 lb 6.4 oz (90.4 kg)   SpO2 99%   BMI 34.77 kg/m  GEN: NAD, alert, cooperative, and pleasant. Left shoulder: +pain with passive abduction of the arm at the shoulder past 90 degrees. Normal strength of the RUE, full ROM, sensation intact. +tenderness around the shoulder joint. Left knee: No redness, erythema, warmth. Full ROM, quadriceps strength intact. Negative anterior and posterior drawer. No tenderness along the joint line. No meniscal laxity. NEURO: negative spurlings. 5/5 strength in 4 extremities. Sensation intact in 4 extremities.  Assessment and plan:  Chronic left shoulder pain Acute over the past 3 days on chronic over past 4-5 years. Documented injury on prior MRI. Nerve impingement vs. Worsening of previous injury are possible etiologies for pain. - referal to PT - will get MRI to rule out further injury - referral to orthopedics - continue ibuprofen for pain  Acute pain of left knee History of edema in  knee in December which has now resolved. Knee is normal on exam and not currently causing pain. Patient is ok with watchful waiting.   Orders Placed This Encounter  Procedures  . MR Shoulder Left Wo Contrast    Ht: 5'3 / wt: 199 / not claus / no metal removed from eyes / no needs / [facility] Epic order/ miriam w pt    Standing Status:   Future    Standing Expiration Date:   11/20/2018    Order Specific Question:   What is the patient's sedation requirement?    Answer:   No Sedation    Order Specific Question:   Does the patient have a pacemaker or implanted devices?    Answer:   No    Order Specific Question:   Preferred imaging location?    Answer:   [facility] (table limit-500 lbs)    Order Specific Question:   Radiology Contrast Protocol - do NOT remove file path    Answer:   \\[email or web address]  . Ambulatory referral to Physical Therapy    Referral Priority:   Routine    Referral Type:   Physical Medicine    Referral Reason:   Specialty Services Required    Requested Specialty:   Physical Therapy    Number of Visits Requested:   1  . Ambulatory referral to Orthopedics    Referral Priority:   Routine    Referral Type:   Consultation  Referral Reason:   Specialty Services Required    Requested Specialty:   Orthopedic Surgery    Number of Visits Requested:   1    Howard Pouch, MD,MS,  PGY2 09/21/2017 4:48 PM

## 2017-09-21 NOTE — Patient Instructions (Addendum)
It was a pleasure seeing you today in our clinic. Today we discussed shoulder pain. Here is the treatment plan we have discussed and agreed upon together:  A consult was placed to orthopedics at today's visit.  You will receive a call to schedule an appointment. If you do not receive a call within two weeks please call our office so we can place the consult again.  You were referred for MRI and physical therapy.  Our clinic's number is [phone]. Please call with questions or concerns about what we discussed today.  Be well, Dr. Mosetta PuttFeng

## 2017-09-21 NOTE — Assessment & Plan Note (Signed)
Acute over the past 3 days on chronic over past 4-5 years. Documented injury on prior MRI. Nerve impingement vs. Worsening of previous injury are possible etiologies for pain. - referal to PT - will get MRI to rule out further injury - referral to orthopedics - continue ibuprofen for pain

## 2017-09-21 NOTE — Assessment & Plan Note (Signed)
History of edema in knee in December which has now resolved. Knee is normal on exam and not currently causing pain. Patient is ok with watchful waiting.

## 2017-09-26 ENCOUNTER — Ambulatory Visit
Admission: RE | Admit: 2017-09-26 | Discharge: 2017-09-26 | Disposition: A | Discharge status: Home / Self Care | Payer: PRIVATE HEALTH INSURANCE | Source: Ambulatory Visit | Attending: Family Medicine | Admitting: Family Medicine

## 2017-09-26 DIAGNOSIS — M25512 Pain in left shoulder: Principal | ICD-10-CM

## 2017-09-26 DIAGNOSIS — G8929 Other chronic pain: Secondary | ICD-10-CM

## 2017-09-30 ENCOUNTER — Telehealth: Payer: Self-pay | Admitting: Family Medicine

## 2017-09-30 DIAGNOSIS — J440 Chronic obstructive pulmonary disease with acute lower respiratory infection: Secondary | ICD-10-CM

## 2017-09-30 MED ORDER — ALBUTEROL SULFATE HFA 108 (90 BASE) MCG/ACT IN AERS: 2.0000 | INHALATION_SPRAY | RESPIRATORY_TRACT | 1 refills | Status: DC | PRN

## 2017-09-30 NOTE — Telephone Encounter (Signed)
Pt needs refill on her allbuterol inhaler [facility] [address] Please let her know when this is called in.

## 2017-10-03 ENCOUNTER — Ambulatory Visit (INDEPENDENT_AMBULATORY_CARE_PROVIDER_SITE_OTHER): Payer: PRIVATE HEALTH INSURANCE | Admitting: Orthopedic Surgery

## 2017-10-03 ENCOUNTER — Ambulatory Visit (INDEPENDENT_AMBULATORY_CARE_PROVIDER_SITE_OTHER): Payer: PRIVATE HEALTH INSURANCE

## 2017-10-03 ENCOUNTER — Encounter (INDEPENDENT_AMBULATORY_CARE_PROVIDER_SITE_OTHER): Payer: Self-pay | Admitting: Orthopedic Surgery

## 2017-10-03 DIAGNOSIS — G8929 Other chronic pain: Secondary | ICD-10-CM

## 2017-10-03 DIAGNOSIS — M79602 Pain in left arm: Secondary | ICD-10-CM

## 2017-10-03 DIAGNOSIS — M25512 Pain in left shoulder: Secondary | ICD-10-CM

## 2017-10-03 NOTE — Addendum Note (Signed)
Addended by: Cherre HugerMAY, Aashna Matson E on: 10/03/2017 04:37 PM   Modules accepted: Orders

## 2017-10-03 NOTE — Progress Notes (Signed)
Office Visit Note   Patient: Monica Sandoval           Date of Birth: June 18, 1976           MRN: [id number] Visit Date: 10/03/2017 Requested by: Howard Pouch, MD [address] [place], [place] [address] PCP: Arlyce Harman, DO  Subjective: Chief Complaint  Patient presents with  . Left Shoulder - Pain    HPI: Monica Sandoval is a 42 year old female with left shoulder pain as well as arm numbness tingling and loss of strength.  She states that in 2014 she swatted a mosquito in the left arm had swelling and pain.  She was diagnosed with bursitis.  She had an injection at that time but states that it was a very bad memory for her.  She does not really want to do that much anymore in terms of injections.  She states that when she goes overhead and behind her she has pain.  At times when she is pulling things down the left arm drops.  She also reports numbness in digits 3 4 and 5.  She reports night pain and difficulty sleeping.  She has had an MRI scan this month which is reviewed.  Shows a very small bursal sided rotator cuff tear and mild bursitis.  It does not appear that the MRI scan accounts for the types of symptoms she is having.              ROS: All systems reviewed are negative as they relate to the chief complaint within the history of present illness.  Patient denies  fevers or chills.   Impression is left shoulder pain neck pain in the trapezial pain with radiation of assessment & Plan: Visit Diagnoses:  1. Chronic left shoulder pain   2. Left arm pain     Plan: Symptoms down the posterior aspect of the arm into the hand.  This looks like fairly classic radiculopathy.  Shoulder MRI is fairly unrevealing and shows only mild bursitis and a very small bursal sided rotator cuff tear.  I reviewed the MRI scan with the patient.  At this time based on her hand numbness and tingling and absence of Tinel's in the cubital tunnel in the elbow I think she may have a component of carpal tunnel syndrome  or nerve compression.  Also radiographs of the cervical spine are unremarkable.  She needs MRI of the cervical spine to evaluate left-sided radiculopathy.  Follow-Up Instructions: Return for after MRI.   Orders:  Orders Placed This Encounter  Procedures  . XR Shoulder Left  . XR Cervical Spine 2 or 3 views   No orders of the defined types were placed in this encounter.     Procedures: No procedures performed   Clinical Data: No additional findings.  Objective: Vital Signs: There were no vitals taken for this visit.  Physical Exam:   Constitutional: Patient appears well-developed HEENT:  Head: Normocephalic Eyes:EOM are normal Neck: Normal range of motion Cardiovascular: Normal rate Pulmonary/chest: Effort normal Neurologic: Patient is alert Skin: Skin is warm Psychiatric: Patient has normal mood and affect    Ortho Exam: Orthopedic exam demonstrates good cervical spine range of motion.  She does have some trapezial tenderness.  Grip strength is intact bilaterally.  Negative apprehension relocation testing.  Radial pulse intact bilaterally.  No other masses lymphadenopathy or skin changes noted in the shoulder girdle region.  Negative Tinel's cubital tunnel in the elbow.  She has fairly symmetric grip strength but  does have radiating paresthesias down the left arm.  The arm girth is grossly symmetric to inspection and measurement.  No muscle atrophy in the left arm.  Neck range of motion is full.  Specialty Comments:  No specialty comments available.  Imaging: Xr Cervical Spine 2 Or 3 Views  Result Date: 10/03/2017 AP lateral cervical spine reviewed.  No arthritis is present.  No facet arthritis or degenerative disc disease in the cervical spine noted.  No fracture or dislocation.  Normal cervical spine  Xr Shoulder Left  Result Date: 10/03/2017 AP lateral outlet left shoulder reviewed.  Mild narrowing of the [facility] is present.  There is no fracture or dislocation.   Visualized lung fields clear.  No significant narrowing of the acromiohumeral or glenohumeral joint surfaces.    PMFS History: Patient Active Problem List   Diagnosis Date Noted  . Chronic left shoulder pain 09/21/2017  . Acute pain of left knee 09/21/2017  . Current mild episode of major depressive disorder without prior episode (HCC) 05/02/2017  . STD exposure 05/02/2017  . Pelvic cramping 10/28/2016  . Hearing loss 10/17/2009  . HEMORRHOIDS, INTERNAL, WITH BLEEDING 01/12/2008  . TOBACCO DEPENDENCE 10/20/2006  . TENSION HEADACHE 10/20/2006  . 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 Asthma     Family History  Problem Relation Age of Onset  . Asthma Mother   . Rheum arthritis Mother   . Arthritis Mother   . Diabetes Father   . Heart disease Father   . Early death Father   . Diabetes Paternal Grandmother   . Heart disease Paternal Grandmother   . Cancer Paternal Grandmother     Past Surgical History:  Procedure Laterality Date  . CHOLECYSTECTOMY  2008   Social History   Occupational History  . Not on file  Tobacco Use  . Smoking status: Current Every Day Smoker    Packs/day: 0.50    Years: 8.00    Pack years: 4.00    Types: Cigarettes  . Smokeless tobacco: Never Used  . Tobacco comment: Sts she quit 09/12/16  Substance and Sexual Activity  . Alcohol use: No  . Drug use: No  . Sexual activity: Yes    Birth control/protection: IUD

## 2017-10-10 ENCOUNTER — Other Ambulatory Visit: Payer: Self-pay

## 2017-10-10 ENCOUNTER — Encounter: Payer: Self-pay | Admitting: Physical Therapy

## 2017-10-10 ENCOUNTER — Ambulatory Visit: Payer: No Typology Code available for payment source | Attending: Family Medicine | Admitting: Physical Therapy

## 2017-10-10 DIAGNOSIS — M25512 Pain in left shoulder: Secondary | ICD-10-CM | POA: Insufficient documentation

## 2017-10-10 DIAGNOSIS — M6281 Muscle weakness (generalized): Secondary | ICD-10-CM | POA: Diagnosis present

## 2017-10-10 DIAGNOSIS — G8929 Other chronic pain: Secondary | ICD-10-CM | POA: Diagnosis present

## 2017-10-10 NOTE — Therapy (Signed)
[facility] [address] [place], [place], [address] Phone: [phone]   Fax:  [phone]  Physical Therapy Evaluation  Patient Details  Name: Monica Sandoval MRN: [id number] Date of Birth: 04/15/1976 Referring Provider: Tobey Grim, MD   Encounter Date: 10/10/2017  PT End of Session - 10/10/17 1722    Visit Number  1    Number of Visits  6    Date for PT Re-Evaluation  11/21/17    PT Start Time  1635    PT Stop Time  1720    PT Time Calculation (min)  45 min    Activity Tolerance  Patient tolerated treatment well    Behavior During Therapy  [facility] for tasks assessed/performed       Past Medical History:  Diagnosis Date  . Asthma     Past Surgical History:  Procedure Laterality Date  . CHOLECYSTECTOMY  2008    There were no vitals filed for this visit.   Subjective Assessment - 10/10/17 1639    Subjective  pt is a 42 y.o F with CC of L shoulder pain that started back in 2014 on July 4th, She swung and smacked a mosquito and felt like her L shoulder pain. Reports that since then the pain and signficant weakness and N/T that started about a month ago into the whole hand. reports no hx or previous L shoulder pain. She has an MRI ordered for your neck.     Limitations  Lifting;House hold activities;Other (comment) sleeping on it    How long can you sit comfortably?  unlimited    How long can you stand comfortably?  unlimited    How long can you walk comfortably?  unlimited    Diagnostic tests  MRI,     Patient Stated Goals  decrease the pain, work related activities and raise arm above head    Currently in Pain?  Yes    Pain Score  5  at worst 9/10, last took ibuprofen at 8am    Pain Location  Shoulder    Pain Orientation  Right;Anterior;Posterior    Pain Descriptors / Indicators  Aching;Constant;Dull    Pain Type  Chronic pain    Pain Radiating Towards  N/T in the arm/ hand     Pain Onset  More than a month ago     Pain Frequency  Constant    Aggravating Factors   raising arm above head, sleeping on it    Pain Relieving Factors  ibuprofen, heating,     Effect of Pain on Daily Activities  limited LUE use         [facility] PT Assessment - 10/10/17 1639      Assessment   Medical Diagnosis  R shoulder pain    Referring Provider  Tobey Grim, MD    Onset Date/Surgical Date  -- July 4th, 2014    Hand Dominance  Left    Next MD Visit  make one PRN    Prior Therapy  no      Precautions   Precautions  None      Restrictions   Weight Bearing Restrictions  No      Balance Screen   Has the patient fallen in the past 6 months  Yes    How many times?  3    Has the patient had a decrease in activity level because of a fear of falling?   No    Is the patient reluctant  to leave their home because of a fear of falling?   No      Home Environment   Living Environment  Private residence    Living Arrangements  Spouse/significant other;Children    Available Help at Discharge  Family;Available PRN/intermittently    Type of Home  House    Home Access  Stairs to enter    Entrance Stairs-Number of Steps  3    Entrance Stairs-Rails  Can reach both    Home Layout  Laundry or work area in basement 15      Prior Function   Level of Independence  Independent with basic ADLs    Vocation  Full time employment Furniture conservator/restorer for [employer] Requirements  prolonged standing, lifting, carrying, pushing/ pulling    Leisure  ping pong, Darts,       Cognition   Overall Cognitive Status  Within Functional Limits for tasks assessed      Observation/Other Assessments   Focus on Therapeutic Outcomes (FOTO)   41% limited predicted 31% limited      Posture/Postural Control   Posture/Postural Control  Postural limitations    Postural Limitations  Rounded Shoulders;Forward head      ROM / Strength   AROM / PROM / Strength  AROM;PROM;Strength      AROM   AROM Assessment Site  Shoulder     Right/Left Shoulder  Right;Left    Right Shoulder Extension  77 Degrees    Right Shoulder Flexion  127 Degrees    Right Shoulder ABduction  135 Degrees    Right Shoulder Internal Rotation  55 Degrees 90/90    Right Shoulder External Rotation  82 Degrees 90/90    Left Shoulder Extension  71 Degrees PDM    Left Shoulder Flexion  130 Degrees no pain     Left Shoulder ABduction  94 Degrees ERP    Left Shoulder Internal Rotation  25 Degrees 90/90    Left Shoulder External Rotation  72 Degrees 90/90      PROM   PROM Assessment Site  Shoulder    Right/Left Shoulder  Left      Strength   Strength Assessment Site  Shoulder;Hand    Right/Left Shoulder  Right;Left    Right Shoulder Flexion  4/5    Right Shoulder Extension  4/5    Right Shoulder ABduction  4/5    Right Shoulder Internal Rotation  4+/5    Right Shoulder External Rotation  4+/5    Left Shoulder Flexion  4-/5    Left Shoulder Extension  4-/5    Left Shoulder ABduction  3+/5    Left Shoulder Internal Rotation  4+/5    Left Shoulder External Rotation  3+/5    Right Hand Grip (lbs)  79 78,75,84    Left Hand Grip (lbs)  71 78,68,67      Palpation   Palpation comment  TTP supraspinatus, teres minor and sub-acromial space. upper trap tightness      Special Tests    Special Tests  Rotator Cuff Impingement    Other special tests  scapular upward assist test (=)    Rotator Cuff Impingment tests  Leanord Asal test;Full Can test;Empty Can test;Painful Arc of Motion      Hawkins-Kennedy test   Findings  Positive    Side  Left      Empty Can test   Findings  Negative      Full Can test   Findings  Negative      Painful Arc of Motion   Findings  Positive    Side  Left             Objective measurements completed on examination: See above findings.      [facility] Adult PT Treatment/Exercise - 10/10/17 1639      Shoulder Exercises: Supine   Protraction  Strengthening;Both;10 reps verbal cues for proper form     Other Supine Exercises  chin tuck 1 x 10 5 sec hold tactile cues for appropriate form      Shoulder Exercises: Standing   Row  Strengthening;Both;10 reps;Theraband    Theraband Level (Shoulder Row)  Level 2 (Red)    Row Limitations  verbal cues and demonstration for proper form      Shoulder Exercises: Stretch   Other Shoulder Stretches  upper trap stretch 2 x 30 sec hold             PT Education - 10/10/17 1722    Education provided  Yes    Education Details  evaluation findings, POC, goals, HEP, anatomy of scapulohumeral joint     Person(s) Educated  Patient    Methods  Explanation;Verbal cues;Demonstration    Comprehension  Verbalized understanding;Verbal cues required;Returned demonstration       PT Short Term Goals - 10/10/17 1728      PT SHORT TERM GOAL #1   Title  pt to be I with inital HEP    Time  3    Period  Weeks    Status  New    Target Date  10/31/17      PT SHORT TERM GOAL #2   Title  pt to verbalize/ demo proper posture and lifting mechanics to prevent/ reduce R shoulder pain as well as RICE    Time  3    Period  Weeks    Status  New    Target Date  10/31/17      PT SHORT TERM GOAL #3   Title  increase L grip strength to >/= 80# to demo improvement in shoulder function     Baseline  71# on L    Time  3    Period  Weeks    Status  New    Target Date  10/31/17        PT Long Term Goals - 10/10/17 1729      PT LONG TERM GOAL #1   Title  pt to increase R shoulder abduciton to >/= 120 degrees and shoulder internal rotation to >/= 60 degrees with </= 2/10 pain for functional mobility required for ADLs    Time  6    Period  Weeks    Status  New    Target Date  11/21/17      PT LONG TERM GOAL #2   Title  increase L overall shoulder strength to >/= 4+/5 in all planes to provide stability with liftin/ carrying activities     Time  6    Period  Weeks    Status  New    Target Date  11/21/17      PT LONG TERM GOAL #3   Title  pt to be able to  lift and lower >/= 10# from an overhead shelf  and lift/ carrying >/= 15# from the floor with </= 1/10 pain for functional strength required for work     Time  6    Period  Weeks    Status  New    Target Date  11/21/17      PT LONG TERM GOAL #4   Title  increase FOTO score to </= 31% limited to demo improvement in function     Time  6    Period  Weeks    Status  New    Target Date  11/21/17      PT LONG TERM GOAL #5   Title  pt to be I with all HEP given as of last visit to maintain and promote current level of function    Time  6    Period  Weeks    Status  New    Target Date  11/21/17             Plan - 10/10/17 1723    Clinical Impression Statement  pt presents to [facility] with CC of L shoulder pain that started in 2014 due to swatting at a mosquito. L shoulder she demonstrated limited motion compared bil as well as signifcant weakness in all planes. special testing is consistent for high probability of impingement of the supraspinatus/ sub-acromial bursa. She would benefit from physical therapy to decrease L shoulder pain, improve mobility and strength and maximize her function by addressing the deficits listed.     Clinical Presentation  Stable    Clinical Decision Making  Low    Rehab Potential  Good    PT Frequency  2x / week    PT Duration  6 weeks    PT Treatment/Interventions  ADLs/Self Care Home Management;Cryotherapy;Electrical Stimulation;Iontophoresis 4mg /ml Dexamethasone;Moist Heat;Ultrasound;Passive range of motion;Dry needling;Taping;Therapeutic activities;Therapeutic exercise;Manual techniques;Neuromuscular re-education;Patient/family education    PT Next Visit Plan  review/ update HEP, scapular stability, rotator cuff/ shoulder strengthening, posture education, scapular mobs, modalities for pain    PT Home Exercise Plan  upper trap stretching, chin tucks, rows, ceiling punches    Consulted and Agree with Plan of Care  Patient       Patient will benefit from  skilled therapeutic intervention in order to improve the following deficits and impairments:  Pain, Impaired UE functional use, Decreased activity tolerance, Decreased endurance, Postural dysfunction, Decreased range of motion, Decreased strength  Visit Diagnosis: Chronic left shoulder pain  Muscle weakness (generalized)     Problem List Patient Active Problem List   Diagnosis Date Noted  . Chronic left shoulder pain 09/21/2017  . Acute pain of left knee 09/21/2017  . Current mild episode of major depressive disorder without prior episode (HCC) 05/02/2017  . STD exposure 05/02/2017  . Pelvic cramping 10/28/2016  . Hearing loss 10/17/2009  . HEMORRHOIDS, INTERNAL, WITH BLEEDING 01/12/2008  . TOBACCO DEPENDENCE 10/20/2006  . TENSION HEADACHE 10/20/2006  . 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

## 2017-10-12 ENCOUNTER — Other Ambulatory Visit (INDEPENDENT_AMBULATORY_CARE_PROVIDER_SITE_OTHER): Payer: Self-pay | Admitting: Orthopedic Surgery

## 2017-10-14 ENCOUNTER — Other Ambulatory Visit: Payer: Self-pay

## 2017-10-14 DIAGNOSIS — I719 Aortic aneurysm of unspecified site, without rupture: Secondary | ICD-10-CM

## 2017-10-16 ENCOUNTER — Inpatient Hospital Stay: Admission: RE | Admit: 2017-10-16 | Payer: PRIVATE HEALTH INSURANCE | Source: Ambulatory Visit

## 2017-10-18 NOTE — Telephone Encounter (Signed)
Pt's pharmacy never received a refill for [facility]ol [facility]er[facility] [address]

## 2017-10-19 ENCOUNTER — Ambulatory Visit (INDEPENDENT_AMBULATORY_CARE_PROVIDER_SITE_OTHER): Payer: PRIVATE HEALTH INSURANCE | Admitting: Orthopedic Surgery

## 2017-10-21 ENCOUNTER — Telehealth: Payer: Self-pay | Admitting: Family Medicine

## 2017-10-21 DIAGNOSIS — J440 Chronic obstructive pulmonary disease with acute lower respiratory infection: Secondary | ICD-10-CM

## 2017-10-21 NOTE — Telephone Encounter (Signed)
Pt has called and requested albuterol. We sent this on 10/01/2017. The problem is she has been calling the pharmacy and us being told we sent it and the pharmacy saying they didn't get it for 3 weeks. When you go in and look at the prescription it was Print instead of e-script. Can we get this to her ASAP today since she has ben waiting.

## 2017-10-23 ENCOUNTER — Ambulatory Visit
Admission: RE | Admit: 2017-10-23 | Discharge: 2017-10-23 | Disposition: A | Discharge status: Home / Self Care | Payer: PRIVATE HEALTH INSURANCE | Source: Ambulatory Visit | Attending: Orthopedic Surgery | Admitting: Orthopedic Surgery

## 2017-10-23 DIAGNOSIS — M79602 Pain in left arm: Secondary | ICD-10-CM

## 2017-10-24 NOTE — Telephone Encounter (Signed)
[facility] and [facility] Al[facility]erol inhaler #1 with one refill.  Patient informed.  Altamese Dilling~Jeannette Richardson, BSN, RN-BC

## 2017-10-25 ENCOUNTER — Telehealth: Payer: Self-pay | Admitting: Physical Therapy

## 2017-10-25 ENCOUNTER — Ambulatory Visit: Payer: No Typology Code available for payment source | Attending: Family Medicine | Admitting: Physical Therapy

## 2017-10-25 ENCOUNTER — Other Ambulatory Visit: Payer: Self-pay | Admitting: Family Medicine

## 2017-10-25 DIAGNOSIS — M6281 Muscle weakness (generalized): Secondary | ICD-10-CM | POA: Insufficient documentation

## 2017-10-25 DIAGNOSIS — M25512 Pain in left shoulder: Secondary | ICD-10-CM | POA: Insufficient documentation

## 2017-10-25 DIAGNOSIS — J440 Chronic obstructive pulmonary disease with acute lower respiratory infection: Secondary | ICD-10-CM

## 2017-10-25 DIAGNOSIS — G8929 Other chronic pain: Secondary | ICD-10-CM | POA: Insufficient documentation

## 2017-10-25 DIAGNOSIS — M542 Cervicalgia: Secondary | ICD-10-CM | POA: Insufficient documentation

## 2017-10-25 MED ORDER — ALBUTEROL SULFATE HFA 108 (90 BASE) MCG/ACT IN AERS: 2.0000 | INHALATION_SPRAY | RESPIRATORY_TRACT | 1 refills | Status: DC | PRN

## 2017-10-25 NOTE — Telephone Encounter (Signed)
Spoke to patient regarding no-show to appointment today. She reports she was unable to leave work today. She plans to attend future scheduled appointments.

## 2017-10-25 NOTE — Telephone Encounter (Signed)
Pt informed. Sarissa Dern, CMA  

## 2017-10-25 NOTE — Progress Notes (Signed)
Patient left message stating Pharmacy never received prescription for albuterol inhaler. Sending to pharmacy again today. I'm not sure why it did not go through the first time.

## 2017-10-27 ENCOUNTER — Ambulatory Visit: Payer: No Typology Code available for payment source | Admitting: Physical Therapy

## 2017-10-27 ENCOUNTER — Encounter (INDEPENDENT_AMBULATORY_CARE_PROVIDER_SITE_OTHER): Payer: Self-pay | Admitting: Physical Medicine and Rehabilitation

## 2017-10-27 ENCOUNTER — Ambulatory Visit (INDEPENDENT_AMBULATORY_CARE_PROVIDER_SITE_OTHER): Payer: PRIVATE HEALTH INSURANCE | Admitting: Physical Medicine and Rehabilitation

## 2017-10-27 ENCOUNTER — Encounter: Payer: Self-pay | Admitting: Physical Therapy

## 2017-10-27 DIAGNOSIS — M25512 Pain in left shoulder: Secondary | ICD-10-CM | POA: Diagnosis not present

## 2017-10-27 DIAGNOSIS — M542 Cervicalgia: Secondary | ICD-10-CM | POA: Diagnosis present

## 2017-10-27 DIAGNOSIS — G8929 Other chronic pain: Secondary | ICD-10-CM

## 2017-10-27 DIAGNOSIS — R202 Paresthesia of skin: Secondary | ICD-10-CM | POA: Diagnosis not present

## 2017-10-27 DIAGNOSIS — M6281 Muscle weakness (generalized): Secondary | ICD-10-CM

## 2017-10-27 NOTE — Therapy (Signed)
[facility] [address] [place], [place], [address] Phone: [phone]   Fax:  [phone]  Physical Therapy Treatment  Patient Details  Name: Monica Sandoval MRN: [id number] Date of Birth: Feb 03, 1976 Referring Provider: Tobey Grim, MD   Encounter Date: 10/27/2017  PT End of Session - 10/27/17 1058    Visit Number  2    Number of Visits  6    Date for PT Re-Evaluation  11/21/17    PT Start Time  1017    PT Stop Time  1058    PT Time Calculation (min)  41 min    Activity Tolerance  Patient tolerated treatment well    Behavior During Therapy  [facility] for tasks assessed/performed       Past Medical History:  Diagnosis Date  . Asthma     Past Surgical History:  Procedure Laterality Date  . CHOLECYSTECTOMY  2008    There were no vitals filed for this visit.  Subjective Assessment - 10/27/17 1019    Subjective  "The exercises are going good, I need alittle reminder on the neck exercise"     Currently in Pain?  Yes    Pain Score  3     Pain Orientation  Right;Anterior    Pain Descriptors / Indicators  Aching    Pain Type  Chronic pain    Aggravating Factors   lifting weight    Pain Relieving Factors  exercise                      [facility] Adult PT Treatment/Exercise - 10/27/17 1024      Neck Exercises: Supine   Neck Retraction  10 reps;5 secs      Shoulder Exercises: Supine   Protraction  Strengthening;15 reps;Weights;Left      Shoulder Exercises: Seated   Other Seated Exercises  2 x 10 with yellow theraband: lower trap strengthening with elbows on bolster      Shoulder Exercises: Standing   Row  Strengthening;Both;10 reps;Theraband x 2 sets    Theraband Level (Shoulder Row)  Level 3 (Green)      Shoulder Exercises: Stretch   Other Shoulder Stretches  Rhomboid stretching 2 x 30 sec seated clasping hands together    Other Shoulder Stretches  upper trap stretch 2 x 30 sec hold      Manual  Therapy   Manual Therapy  Scapular mobilization    Manual therapy comments  MTPR along the L upper trap/ levator scapulae, middle scalene, and Rhomboids    Scapular Mobilization  upward scapular assist with pt reaching up wall 2 x 10               PT Short Term Goals - 10/10/17 1728      PT SHORT TERM GOAL #1   Title  pt to be I with inital HEP    Time  3    Period  Weeks    Status  New    Target Date  10/31/17      PT SHORT TERM GOAL #2   Title  pt to verbalize/ demo proper posture and lifting mechanics to prevent/ reduce R shoulder pain as well as RICE    Time  3    Period  Weeks    Status  New    Target Date  10/31/17      PT SHORT TERM GOAL #3   Title  increase L grip strength to >/= 80#  to demo improvement in shoulder function     Baseline  71# on L    Time  3    Period  Weeks    Status  New    Target Date  10/31/17        PT Long Term Goals - 10/10/17 1729      PT LONG TERM GOAL #1   Title  pt to increase R shoulder abduciton to >/= 120 degrees and shoulder internal rotation to >/= 60 degrees with </= 2/10 pain for functional mobility required for ADLs    Time  6    Period  Weeks    Status  New    Target Date  11/21/17      PT LONG TERM GOAL #2   Title  increase L overall shoulder strength to >/= 4+/5 in all planes to provide stability with liftin/ carrying activities     Time  6    Period  Weeks    Status  New    Target Date  11/21/17      PT LONG TERM GOAL #3   Title  pt to be able to lift and lower >/= 10# from an overhead shelf  and lift/ carrying >/= 15# from the floor with </= 1/10 pain for functional strength required for work     Time  6    Period  Weeks    Status  New    Target Date  11/21/17      PT LONG TERM GOAL #4   Title  increase FOTO score to </= 31% limited to demo improvement in function     Time  6    Period  Weeks    Status  New    Target Date  11/21/17      PT LONG TERM GOAL #5   Title  pt to be I with all HEP given  as of last visit to maintain and promote current level of function    Time  6    Period  Weeks    Status  New    Target Date  11/21/17            Plan - 10/27/17 1059    Clinical Impression Statement  pt reports compliance with HEP requiring minimal verbal cueing for proper from with chin tucks. She was able to perform all exercises with no report of pain. manaul techinques to relieve upper trap/ levator scapulae tightness. With scapular assist exercise palpable cavitation was noted at the A/C which she reported relief of pain with holding the distal clavicle down with shoulder AROM. She reported 3/10 pain at end of session and declined modalities.     PT Next Visit Plan  update HEP, scapular stability, rotator cuff/ shoulder strengthening, posture education, scapular mobs, modalities for pain, Clavicle mobs could trial AC joint taping    PT Home Exercise Plan  upper trap stretching, chin tucks, rows, ceiling punches    Consulted and Agree with Plan of Care  Patient       Patient will benefit from skilled therapeutic intervention in order to improve the following deficits and impairments:  Pain, Impaired UE functional use, Decreased activity tolerance, Decreased endurance, Postural dysfunction, Decreased range of motion, Decreased strength  Visit Diagnosis: Chronic left shoulder pain  Muscle weakness (generalized)     Problem List Patient Active Problem List   Diagnosis Date Noted  . Chronic left shoulder pain 09/21/2017  . Acute pain of left knee 09/21/2017  .  Current mild episode of major depressive disorder without prior episode (HCC) 05/02/2017  . STD exposure 05/02/2017  . Pelvic cramping 10/28/2016  . Hearing loss 10/17/2009  . HEMORRHOIDS, INTERNAL, WITH BLEEDING 01/12/2008  . TOBACCO DEPENDENCE 10/20/2006  . TENSION HEADACHE 10/20/2006  . 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 [place], [place], [address] Phone: [phone]   Fax:  [phone]  Name: Monica Sandoval MRN: [id number] Date of Birth: 01/11/1976

## 2017-10-27 NOTE — Progress Notes (Signed)
  Numeric Pain Rating Scale and Functional Assessment Average Pain 5   In the last MONTH (on 0-10 scale) has pain interfered with the following?  1. General activity like being  able to carry out your everyday physical activities such as walking, climbing stairs, carrying groceries, or moving a chair?  Rating(2)     

## 2017-10-28 NOTE — Progress Notes (Signed)
Monica Sandoval - 42 y.o. female MRN [id number]  Date of birth: Nov 01, 1975  Office Visit Note: Visit Date: 10/27/2017 PCP: Arlyce Harman, DO Referred by: Arlyce Harman, DO  Subjective: Chief Complaint  Patient presents with  . Right Arm - Pain, Numbness  . Left Arm - Pain, Numbness  . Left Hand - Numbness, Pain, Edema  . Right Hand - Pain, Numbness, Edema   HPI: Monica Sandoval is a 42 year old right-hand-dominant female that comes in today at the request of Dr. August Saucer for electrodiagnostic studies of both upper limbs.  She has been complaining of chronic left shoulder pain for some time.  Dr. August Saucer has evaluated this and she does have MRI of the shoulder showing small tear of the rotator cuff and some bursitis.  He felt like this did not really represent all of her symptoms and did get an MRI of the cervical spine which has been completed but not reviewed.  He also ordered electrodiagnostic study for which she is here today.  Her main complaints are numbness in both right and left arms more posterior lateral that her left arm will completely be numb at times into the left hand in the middle and ring finger and somewhat of a C7 distribution or possibly median nerve distribution.  She feels like both hands swell at times and she does get fingertip tingling on the right but not as much tingling on the right as the left.  She reports that this started several months ago.  She has not found anything yet that will make it better.  She has not had prior electrodiagnostic studies.  She had prior injection in the shoulder which she did not like at all and does not like the idea of injections.    ROS Otherwise per HPI.  Assessment & Plan: Visit Diagnoses:  1. Paresthesia of skin     Plan: No additional findings.  Impression: The above electrodiagnostic study is ABNORMAL and reveals evidence of:  1.  A  mild BILATERAL median nerve entrapment at the wrist (carpal tunnel syndrome) affecting sensory  components.  There may have been some temperature artifact as well.  2.  A mild chronic C6 and C7 radiculopathy on the left.    There is no significant electrodiagnostic evidence of any other brachial plexopathy or peripheral polyneuropathy.  This electrodiagnostic study cannot rule out small fiber polyneuropathy and dysesthesias from central pain sensitization syndromes such as fibromyalgia.  Recommendations: 1.  Follow-up with referring physician. 2.  Continue current management of symptoms.   Meds & Orders: No orders of the defined types were placed in this encounter.   Orders Placed This Encounter  Procedures  . NCV with EMG (electromyography)    Follow-up: Return for Dr. August Saucer.   Procedures: No procedures performed  EMG & NCV Findings: Evaluation of the left median motor nerve showed reduced amplitude (4.5 mV).  The left median (across palm) sensory nerve showed prolonged distal peak latency (Wrist, 4.0 ms) and prolonged distal peak latency (Palm, 2.1 ms).  The right median (across palm) sensory nerve showed prolonged distal peak latency (Wrist, 4.0 ms).  All remaining nerves (as indicated in the following tables) were within normal limits.  All left vs. right side differences were within normal limits.    Needle evaluation of the left deltoid muscle showed increased insertional activity.  The left triceps (medial head) and the left Ext Digitorum muscles showed increased insertional activity and diminished recruitment.  All remaining muscles (as indicated in the  following table) showed no evidence of electrical instability.    Impression: The above electrodiagnostic study is ABNORMAL and reveals evidence of:  1.  A  mild BILATERAL median nerve entrapment at the wrist (carpal tunnel syndrome) affecting sensory components.  There may have been some temperature artifact as well.  2.  A mild chronic C6 and C7 radiculopathy on the left.    There is no significant electrodiagnostic  evidence of any other brachial plexopathy or peripheral polyneuropathy.  This electrodiagnostic study cannot rule out small fiber polyneuropathy and dysesthesias from central pain sensitization syndromes such as fibromyalgia.  Recommendations: 1.  Follow-up with referring physician. 2.  Continue current management of symptoms.    Nerve Conduction Studies Anti Sensory Summary Table   Stim Site NR Peak (ms) Norm Peak (ms) P-T Amp (V) Norm P-T Amp Site1 Site2 Delta-P (ms) Dist (cm) Vel (m/s) Norm Vel (m/s)  Left Median Acr Palm Anti Sensory (2nd Digit)  31.4C  Wrist    *4.0 <3.6 27.2 >10 Wrist Palm 1.9 0.0    Palm    *2.1 <2.0 21.3         Right Median Acr Palm Anti Sensory (2nd Digit)  31.8C  Wrist    *4.0 <3.6 35.3 >10 Wrist Palm 2.1 0.0    Palm    1.9 <2.0 20.2         Left Radial Anti Sensory (Base 1st Digit)  31.2C  Wrist    2.1 <3.1 40.2  Wrist Base 1st Digit 2.1 0.0    Right Radial Anti Sensory (Base 1st Digit)  32.6C  Wrist    2.1 <3.1 22.8  Wrist Base 1st Digit 2.1 0.0    Left Ulnar Anti Sensory (5th Digit)  31.5C  Wrist    3.3 <3.7 43.1 >15.0 Wrist 5th Digit 3.3 14.0 42 >38  Right Ulnar Anti Sensory (5th Digit)  32.2C  Wrist    3.3 <3.7 24.8 >15.0 Wrist 5th Digit 3.3 14.0 42 >38   Motor Summary Table   Stim Site NR Onset (ms) Norm Onset (ms) O-P Amp (mV) Norm O-P Amp Site1 Site2 Delta-0 (ms) Dist (cm) Vel (m/s) Norm Vel (m/s)  Left Median Motor (Abd Poll Brev)  31.5C  Wrist    4.2 <4.2 *4.5 >5 Elbow Wrist 3.8 21.0 55 >50  Elbow    8.0  4.6         Right Median Motor (Abd Poll Brev)  32.5C  Wrist    3.8 <4.2 6.4 >5 Elbow Wrist 3.8 20.0 53 >50  Elbow    7.6  6.4         Left Ulnar Motor (Abd Dig Min)  31.7C  Wrist    2.8 <4.2 9.4 >3 B Elbow Wrist 3.4 20.0 59 >53  B Elbow    6.2  8.4  A Elbow B Elbow 1.1 10.0 91 >53  A Elbow    7.3  9.3         Right Ulnar Motor (Abd Dig Min)  32C  Wrist    2.7 <4.2 8.6 >3 B Elbow Wrist 3.0 19.0 63 >53  B Elbow    5.7  9.3   A Elbow B Elbow 1.2 10.5 87 >53  A Elbow    6.9  8.9          EMG   Side Muscle Nerve Root Ins Act Fibs Psw Amp Dur Poly Recrt Int Dennie Bible Comment  Left Abd Poll Brev Median C8-T1 Nml Nml Nml Nml  Nml 0 Nml Nml   Left 1stDorInt Ulnar C8-T1 Nml Nml Nml Nml Nml 0 Nml Nml   Left Deltoid Axillary C5-6 *Incr Nml Nml Nml Nml 0 Nml Nml   Left Triceps (Medial) Radial C6-7-8 *Incr Nml Nml Nml Nml 0 *Reduced Nml   Left Ext Digitorum  Radial (Post Int) C7-8 *Incr Nml Nml Nml Nml 0 *Reduced Nml     Nerve Conduction Studies Anti Sensory Left/Right Comparison   Stim Site L Lat (ms) R Lat (ms) L-R Lat (ms) L Amp (V) R Amp (V) L-R Amp (%) Site1 Site2 L Vel (m/s) R Vel (m/s) L-R Vel (m/s)  Median Acr Palm Anti Sensory (2nd Digit)  31.4C  Wrist *4.0 *4.0 0.0 27.2 35.3 22.9 Wrist Palm     Palm *2.1 1.9 0.2 21.3 20.2 5.2       Radial Anti Sensory (Base 1st Digit)  31.2C  Wrist 2.1 2.1 0.0 40.2 22.8 43.3 Wrist Base 1st Digit     Ulnar Anti Sensory (5th Digit)  31.5C  Wrist 3.3 3.3 0.0 43.1 24.8 42.5 Wrist 5th Digit 42 42 0   Motor Left/Right Comparison   Stim Site L Lat (ms) R Lat (ms) L-R Lat (ms) L Amp (mV) R Amp (mV) L-R Amp (%) Site1 Site2 L Vel (m/s) R Vel (m/s) L-R Vel (m/s)  Median Motor (Abd Poll Brev)  31.5C  Wrist 4.2 3.8 0.4 *4.5 6.4 29.7 Elbow Wrist 55 53 2  Elbow 8.0 7.6 0.4 4.6 6.4 28.1       Ulnar Motor (Abd Dig Min)  31.7C  Wrist 2.8 2.7 0.1 9.4 8.6 8.5 B Elbow Wrist 59 63 4  B Elbow 6.2 5.7 0.5 8.4 9.3 9.7 A Elbow B Elbow 91 87 4  A Elbow 7.3 6.9 0.4 9.3 8.9 4.3          Waveforms:                     Clinical History: MRI CERVICAL SPINE WITHOUT CONTRAST  TECHNIQUE: Multiplanar, multisequence MR imaging of the cervical spine was performed. No intravenous contrast was administered.  COMPARISON:  Prior radiographs from 02/28/2014.  FINDINGS: Alignment: Straightening of the normal cervical lordosis. No listhesis.  Vertebrae: Vertebral body heights are  maintained without evidence for acute or chronic fracture. Bone marrow signal intensity within normal limits. No discrete or worrisome osseous lesions. No abnormal marrow edema.  Cord: Signal intensity within the cervical spinal cord is normal.  Posterior Fossa, vertebral arteries, paraspinal tissues: Visualized brain and posterior fossa within normal limits. Craniocervical junction normal. Paraspinous and prevertebral soft tissues are normal. Normal intravascular flow voids present within the vertebral arteries bilaterally.  Disc levels:  C2-C3: Unremarkable.  C3-C4: Tiny central disc protrusion indents the ventral thecal sac. No stenosis.  C4-C5: Broad and somewhat lobulated right paracentral disc protrusion flattens and indents the ventral thecal sac, extending laterally towards the right neural foramen (series 9, image 15). Mild flattening of the cervical spinal cord without cord signal changes. Mild spinal stenosis. Superimposed bilateral uncovertebral hypertrophy. Moderate right C5 foraminal stenosis.  C5-C6: Broad posterior disc protrusion, slightly eccentric to the right. Flattening with partial effacement of the ventral thecal sac. Minimal cord flattening without cord signal changes. Mild spinal stenosis. Right-sided uncovertebral spurring with resultant mild right C6 foraminal stenosis.  C6-C7: Mild disc bulge. No significant canal or neural foraminal stenosis.  C7-T1:  Unremarkable.  Visualized upper thoracic spine within normal limits.  IMPRESSION: 1. Broad right paracentral  disc protrusion at C4-5 with resultant mild canal and moderate right C5 foraminal stenosis. 2. Broad posterior disc bulge at C5-6 with resultant mild canal with right C6 foraminal stenosis. 3. Mild disc bulging at C3-4 and C6-7 without significant stenosis.   Electronically Signed   By: Rise MuBenjamin  McClintock M.D.   On: 10/23/2017 22:26   She reports that she has been  smoking cigarettes.  She has a 4.00 pack-year smoking history. she has never used smokeless tobacco.  Recent Labs    05/02/17 1705  HGBA1C 5.1    Objective:  VS:  HT:    WT:   BMI:     BP:   HR: bpm  TEMP: ( )  RESP:  Physical Exam  Musculoskeletal:  Inspection reveals no atrophy of the bilateral APB or FDI or hand intrinsics. There is no swelling, color changes, allodynia or dystrophic changes. There is 5 out of 5 strength in the bilateral wrist extension, finger abduction and long finger flexion. There is intact sensation to light touch in all dermatomal and peripheral nerve distributions. There is a negative Phalen's test bilaterally. There is a negative Hoffmann's test bilaterally.    Ortho Exam Imaging: No results found.  Past Medical/Family/Surgical/Social History: Medications & Allergies reviewed per EMR, new medications updated. Patient Active Problem List   Diagnosis Date Noted  . Chronic left shoulder pain 09/21/2017  . Acute pain of left knee 09/21/2017  . Current mild episode of major depressive disorder without prior episode (HCC) 05/02/2017  . STD exposure 05/02/2017  . Pelvic cramping 10/28/2016  . Hearing loss 10/17/2009  . HEMORRHOIDS, INTERNAL, WITH BLEEDING 01/12/2008  . TOBACCO DEPENDENCE 10/20/2006  . TENSION HEADACHE 10/20/2006  . 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 Asthma    Family History  Problem Relation Age of Onset  . Asthma Mother   . Rheum arthritis Mother   . Arthritis Mother   . Diabetes Father   . Heart disease Father   . Early death Father   . Diabetes Paternal Grandmother   . Heart disease Paternal Grandmother   . Cancer Paternal Grandmother    Past Surgical History:  Procedure Laterality Date  . CHOLECYSTECTOMY  2008   Social History   Occupational History  . Not on file  Tobacco Use  . Smoking status: Current Every Day Smoker    Packs/day: 0.50    Years: 8.00    Pack  years: 4.00    Types: Cigarettes  . Smokeless tobacco: Never Used  . Tobacco comment: Sts she quit 09/12/16  Substance and Sexual Activity  . Alcohol use: No  . Drug use: No  . Sexual activity: Yes    Birth control/protection: IUD

## 2017-10-28 NOTE — Procedures (Signed)
EMG & NCV Findings: Evaluation of the left median motor nerve showed reduced amplitude (4.5 mV).  The left median (across palm) sensory nerve showed prolonged distal peak latency (Wrist, 4.0 ms) and prolonged distal peak latency (Palm, 2.1 ms).  The right median (across palm) sensory nerve showed prolonged distal peak latency (Wrist, 4.0 ms).  All remaining nerves (as indicated in the following tables) were within normal limits.  All left vs. right side differences were within normal limits.    Needle evaluation of the left deltoid muscle showed increased insertional activity.  The left triceps (medial head) and the left Ext Digitorum muscles showed increased insertional activity and diminished recruitment.  All remaining muscles (as indicated in the following table) showed no evidence of electrical instability.    Impression: The above electrodiagnostic study is ABNORMAL and reveals evidence of:  1.  A  mild BILATERAL median nerve entrapment at the wrist (carpal tunnel syndrome) affecting sensory components.  There may have been some temperature artifact as well.  2.  A mild chronic C6 and C7 radiculopathy on the left.    There is no significant electrodiagnostic evidence of any other brachial plexopathy or peripheral polyneuropathy.  This electrodiagnostic study cannot rule out small fiber polyneuropathy and dysesthesias from central pain sensitization syndromes such as fibromyalgia.  Recommendations: 1.  Follow-up with referring physician. 2.  Continue current management of symptoms.    Nerve Conduction Studies Anti Sensory Summary Table   Stim Site NR Peak (ms) Norm Peak (ms) P-T Amp (V) Norm P-T Amp Site1 Site2 Delta-P (ms) Dist (cm) Vel (m/s) Norm Vel (m/s)  Left Median Acr Palm Anti Sensory (2nd Digit)  31.4C  Wrist    *4.0 <3.6 27.2 >10 Wrist Palm 1.9 0.0    Palm    *2.1 <2.0 21.3         Right Median Acr Palm Anti Sensory (2nd Digit)  31.8C  Wrist    *4.0 <3.6 35.3 >10 Wrist  Palm 2.1 0.0    Palm    1.9 <2.0 20.2         Left Radial Anti Sensory (Base 1st Digit)  31.2C  Wrist    2.1 <3.1 40.2  Wrist Base 1st Digit 2.1 0.0    Right Radial Anti Sensory (Base 1st Digit)  32.6C  Wrist    2.1 <3.1 22.8  Wrist Base 1st Digit 2.1 0.0    Left Ulnar Anti Sensory (5th Digit)  31.5C  Wrist    3.3 <3.7 43.1 >15.0 Wrist 5th Digit 3.3 14.0 42 >38  Right Ulnar Anti Sensory (5th Digit)  32.2C  Wrist    3.3 <3.7 24.8 >15.0 Wrist 5th Digit 3.3 14.0 42 >38   Motor Summary Table   Stim Site NR Onset (ms) Norm Onset (ms) O-P Amp (mV) Norm O-P Amp Site1 Site2 Delta-0 (ms) Dist (cm) Vel (m/s) Norm Vel (m/s)  Left Median Motor (Abd Poll Brev)  31.5C  Wrist    4.2 <4.2 *4.5 >5 Elbow Wrist 3.8 21.0 55 >50  Elbow    8.0  4.6         Right Median Motor (Abd Poll Brev)  32.5C  Wrist    3.8 <4.2 6.4 >5 Elbow Wrist 3.8 20.0 53 >50  Elbow    7.6  6.4         Left Ulnar Motor (Abd Dig Min)  31.7C  Wrist    2.8 <4.2 9.4 >3 B Elbow Wrist 3.4 20.0 59 >53  B Elbow  6.2  8.4  A Elbow B Elbow 1.1 10.0 91 >53  A Elbow    7.3  9.3         Right Ulnar Motor (Abd Dig Min)  32C  Wrist    2.7 <4.2 8.6 >3 B Elbow Wrist 3.0 19.0 63 >53  B Elbow    5.7  9.3  A Elbow B Elbow 1.2 10.5 87 >53  A Elbow    6.9  8.9          EMG   Side Muscle Nerve Root Ins Act Fibs Psw Amp Dur Poly Recrt Int Dennie Bible Comment  Left Abd Poll Brev Median C8-T1 Nml Nml Nml Nml Nml 0 Nml Nml   Left 1stDorInt Ulnar C8-T1 Nml Nml Nml Nml Nml 0 Nml Nml   Left Deltoid Axillary C5-6 *Incr Nml Nml Nml Nml 0 Nml Nml   Left Triceps (Medial) Radial C6-7-8 *Incr Nml Nml Nml Nml 0 *Reduced Nml   Left Ext Digitorum  Radial (Post Int) C7-8 *Incr Nml Nml Nml Nml 0 *Reduced Nml     Nerve Conduction Studies Anti Sensory Left/Right Comparison   Stim Site L Lat (ms) R Lat (ms) L-R Lat (ms) L Amp (V) R Amp (V) L-R Amp (%) Site1 Site2 L Vel (m/s) R Vel (m/s) L-R Vel (m/s)  Median Acr Palm Anti Sensory (2nd Digit)  31.4C  Wrist  *4.0 *4.0 0.0 27.2 35.3 22.9 Wrist Palm     Palm *2.1 1.9 0.2 21.3 20.2 5.2       Radial Anti Sensory (Base 1st Digit)  31.2C  Wrist 2.1 2.1 0.0 40.2 22.8 43.3 Wrist Base 1st Digit     Ulnar Anti Sensory (5th Digit)  31.5C  Wrist 3.3 3.3 0.0 43.1 24.8 42.5 Wrist 5th Digit 42 42 0   Motor Left/Right Comparison   Stim Site L Lat (ms) R Lat (ms) L-R Lat (ms) L Amp (mV) R Amp (mV) L-R Amp (%) Site1 Site2 L Vel (m/s) R Vel (m/s) L-R Vel (m/s)  Median Motor (Abd Poll Brev)  31.5C  Wrist 4.2 3.8 0.4 *4.5 6.4 29.7 Elbow Wrist 55 53 2  Elbow 8.0 7.6 0.4 4.6 6.4 28.1       Ulnar Motor (Abd Dig Min)  31.7C  Wrist 2.8 2.7 0.1 9.4 8.6 8.5 B Elbow Wrist 59 63 4  B Elbow 6.2 5.7 0.5 8.4 9.3 9.7 A Elbow B Elbow 91 87 4  A Elbow 7.3 6.9 0.4 9.3 8.9 4.3          Waveforms:

## 2017-11-01 ENCOUNTER — Encounter: Payer: Self-pay | Admitting: Physical Therapy

## 2017-11-01 ENCOUNTER — Ambulatory Visit: Payer: No Typology Code available for payment source | Admitting: Physical Therapy

## 2017-11-01 DIAGNOSIS — G8929 Other chronic pain: Secondary | ICD-10-CM

## 2017-11-01 DIAGNOSIS — M25512 Pain in left shoulder: Secondary | ICD-10-CM | POA: Diagnosis not present

## 2017-11-01 DIAGNOSIS — M6281 Muscle weakness (generalized): Secondary | ICD-10-CM

## 2017-11-01 NOTE — Therapy (Signed)
[facility] [address] [place], [place], [address] Phone: [phone]   Fax:  [phone]  Physical Therapy Treatment  Patient Details  Name: Monica Sandoval MRN: [id number] Date of Birth: 12/16/1975 Referring Provider: Tobey GrimWalden, Jeffrey H, MD   Encounter Date: 11/01/2017  PT End of Session - 11/01/17 1729    Visit Number  3    Number of Visits  6    Date for PT Re-Evaluation  11/21/17    PT Start Time  1635    PT Stop Time  1725    PT Time Calculation (min)  50 min    Activity Tolerance  Patient tolerated treatment well    Behavior During Therapy  [facility] for tasks assessed/performed       Past Medical History:  Diagnosis Date  . Asthma     Past Surgical History:  Procedure Laterality Date  . CHOLECYSTECTOMY  2008    There were no vitals filed for this visit.  Subjective Assessment - 11/01/17 1639    Subjective  Gets results of MRI tomorrow.  No pain right now.  Woke up without pain  has had a normal busy day.  Exercises take about 10 minutes to do.  She has been working on good posture at work.      Currently in Pain?  No/denies    Pain Location  Shoulder    Pain Orientation  Right;Anterior    Pain Descriptors / Indicators  Aching;Headache;Constant;Throbbing    Pain Frequency  Intermittent    Aggravating Factors   holding head down at work    Pain Relieving Factors  exercise,,     Effect of Pain on Daily Activities  avoids heavy lifting and avoids over the head lifting,  Gets  help at work.     Multiple Pain Sites  -- chest hurts all the time from a hernia into left arm.  has left knee pain   5-6/10  up to 10/ 10                      [facility] Adult PT Treatment/Exercise - 11/01/17 0001      Shoulder Exercises: Supine   Horizontal ABduction  10 reps with chin tuck  HEP    Flexion  5 reps start at 90 move arms opposite with chin tuck stopped, numb       Shoulder Exercises: Standing   Row  10 reps 2 sets     Theraband Level (Shoulder Row)  Level 3 (Green)      Modalities   Modalities  Cryotherapy      Cryotherapy   Number Minutes Cryotherapy  6 Minutes    Cryotherapy Location  Cervical;Shoulder    Type of Cryotherapy  -- cold pack did not like for neck,  shoulder OK      Manual Therapy   Manual Therapy  Taping    Kinesiotex  Inhibit Muscle;Ligament Correction      Kinesiotix   Inhibit Muscle   DELTOID,  SUPRASPINATUS    Ligament Correction  ac JOINT 50 %             PT Education - 11/01/17 1729    Education provided  Yes    Education Details  how tape works    Teacher, musicerson(s) Educated  Patient    Methods  Explanation;Verbal cues    Comprehension  Verbalized understanding       PT Short Term Goals - 10/10/17 1728  PT SHORT TERM GOAL #1   Title  pt to be I with inital HEP    Time  3    Period  Weeks    Status  New    Target Date  10/31/17      PT SHORT TERM GOAL #2   Title  pt to verbalize/ demo proper posture and lifting mechanics to prevent/ reduce R shoulder pain as well as RICE    Time  3    Period  Weeks    Status  New    Target Date  10/31/17      PT SHORT TERM GOAL #3   Title  increase L grip strength to >/= 80# to demo improvement in shoulder function     Baseline  71# on L    Time  3    Period  Weeks    Status  New    Target Date  10/31/17        PT Long Term Goals - 10/10/17 1729      PT LONG TERM GOAL #1   Title  pt to increase R shoulder abduciton to >/= 120 degrees and shoulder internal rotation to >/= 60 degrees with </= 2/10 pain for functional mobility required for ADLs    Time  6    Period  Weeks    Status  New    Target Date  11/21/17      PT LONG TERM GOAL #2   Title  increase L overall shoulder strength to >/= 4+/5 in all planes to provide stability with liftin/ carrying activities     Time  6    Period  Weeks    Status  New    Target Date  11/21/17      PT LONG TERM GOAL #3   Title  pt to be able to lift and lower >/= 10#  from an overhead shelf  and lift/ carrying >/= 15# from the floor with </= 1/10 pain for functional strength required for work     Time  6    Period  Weeks    Status  New    Target Date  11/21/17      PT LONG TERM GOAL #4   Title  increase FOTO score to </= 31% limited to demo improvement in function     Time  6    Period  Weeks    Status  New    Target Date  11/21/17      PT LONG TERM GOAL #5   Title  pt to be I with all HEP given as of last visit to maintain and promote current level of function    Time  6    Period  Weeks    Status  New    Target Date  11/21/17            Plan - 11/01/17 1730    Clinical Impression Statement  Tape trial for shoulder.  Patient woke up without pain this morning.  It has lasted all day.  She advanced HEP to green band.  Cervical stabilization shoulder flexion increased numbness into left arm..   She has not used ibuprophen today.  Lt shoulder swollen .      PT Next Visit Plan    See what MD said.  Assess tape. update HEP, scapular stability, rotator cuff/ shoulder strengthening, posture education, scapular mobs, modalities for pain, Clavicle mobs could trial AC joint taping    PT Home  Exercise Plan  upper trap stretching, chin tucks, rows, ceiling punches    Consulted and Agree with Plan of Care  Patient       Patient will benefit from skilled therapeutic intervention in order to improve the following deficits and impairments:     Visit Diagnosis: Chronic left shoulder pain  Muscle weakness (generalized)     Problem List Patient Active Problem List   Diagnosis Date Noted  . Chronic left shoulder pain 09/21/2017  . Acute pain of left knee 09/21/2017  . Current mild episode of major depressive disorder without prior episode (HCC) 05/02/2017  . STD exposure 05/02/2017  . Pelvic cramping 10/28/2016  . Hearing loss 10/17/2009  . HEMORRHOIDS, INTERNAL, WITH BLEEDING 01/12/2008  . TOBACCO DEPENDENCE 10/20/2006  . TENSION HEADACHE  10/20/2006  . 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

## 2017-11-02 ENCOUNTER — Ambulatory Visit (INDEPENDENT_AMBULATORY_CARE_PROVIDER_SITE_OTHER): Payer: PRIVATE HEALTH INSURANCE | Admitting: Orthopedic Surgery

## 2017-11-02 ENCOUNTER — Encounter (INDEPENDENT_AMBULATORY_CARE_PROVIDER_SITE_OTHER): Payer: Self-pay | Admitting: Orthopedic Surgery

## 2017-11-02 DIAGNOSIS — M5412 Radiculopathy, cervical region: Secondary | ICD-10-CM

## 2017-11-02 NOTE — Progress Notes (Signed)
Office Visit Note   Patient: Monica Sandoval           Date of Birth: 1976-05-07           MRN: [id number] Visit Date: 11/02/2017 Requested by: Arlyce Harman, DO [address] [place], [place] [address] PCP: Arlyce Harman, DO  Subjective: Chief Complaint  Patient presents with  . Follow-up    review MRI and EMG/NCV    HPI: Monica Sandoval is a patient with neck and left arm pain.  Since I have seen her she has had EMG nerve study as well as C-spine MRI.  EMG shows mild bilateral carpal tunnel syndrome along with chronic C6-C7 radiculopathy on the left-hand side.  MRI scan shows multilevel disc protrusions at C4-5 and C5-6.  There is some right-sided predominance at the C4-5 level.  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  She is taking ibuprofen for her symptoms.  Denies any loss of dexterity in her hand              ROS: All systems reviewed are negative as they relate to the chief complaint within the history of present illness.  Patient denies  fevers or chills.   Assessment & Plan: Visit Diagnoses:  1. Cervical radiculopathy     Plan: Impression is mild bilateral carpal tunnel syndrome and cervical radiculopathy on the left-hand side.  This does not look surgical on either neck or hand.  I would favor an injection with Dr. Alvester Morin for symptomatic management.  If that fails that she may need surgical consultation for her neck as it appears that the whole arm symptoms predominate over the milder palmar hand symptoms  Follow-Up Instructions: No Follow-up on file.   Orders:  Orders Placed This Encounter  Procedures  . Ambulatory referral to Physical Medicine Rehab   No orders of the defined types were placed in this encounter.     Procedures: No procedures performed   Clinical Data: No additional findings.  Objective: Vital Signs: There were no vitals taken for this visit.  Physical Exam:   Constitutional: Patient appears well-developed HEENT:   Head: Normocephalic Eyes:EOM are normal Neck: Normal range of motion Cardiovascular: Normal rate Pulmonary/chest: Effort normal Neurologic: Patient is alert Skin: Skin is warm Psychiatric: Patient has normal mood and affect    Ortho Exam: Orthopedic exam demonstrates good grip EPL FPL interosseous resection wrist extension bicep triceps and deltoid strength.  Neck range of motion is reasonable but she does have a little tenderness down around the cervicothoracic junction.  Radial pulses intact bilaterally.  No other masses lymph adenopathy or skin changes noted in the neck region or arm or hand region.  Specialty Comments:  No specialty comments available.  Imaging: No results found.   PMFS History: Patient Active Problem List   Diagnosis Date Noted  . Chronic left shoulder pain 09/21/2017  . Acute pain of left knee 09/21/2017  . Current mild episode of major depressive disorder without prior episode (HCC) 05/02/2017  . STD exposure 05/02/2017  . Pelvic cramping 10/28/2016  . Hearing loss 10/17/2009  . HEMORRHOIDS, INTERNAL, WITH BLEEDING 01/12/2008  . TOBACCO DEPENDENCE 10/20/2006  . TENSION HEADACHE 10/20/2006  . 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 Asthma     Family History  Problem Relation Age of Onset  . Asthma Mother   . Rheum arthritis Mother   . Arthritis Mother   . Diabetes Father   .  Heart disease Father   . Early death Father   . Diabetes Paternal Grandmother   . Heart disease Paternal Grandmother   . Cancer Paternal Grandmother     Past Surgical History:  Procedure Laterality Date  . CHOLECYSTECTOMY  2008   Social History   Occupational History  . Not on file  Tobacco Use  . Smoking status: Current Every Day Smoker    Packs/day: 0.50    Years: 8.00    Pack years: 4.00    Types: Cigarettes  . Smokeless tobacco: Never Used  . Tobacco comment: Sts she quit 09/12/16  Substance and Sexual  Activity  . Alcohol use: No  . Drug use: No  . Sexual activity: Yes    Birth control/protection: IUD

## 2017-11-03 ENCOUNTER — Encounter: Payer: Self-pay | Admitting: Physical Therapy

## 2017-11-03 ENCOUNTER — Ambulatory Visit: Payer: No Typology Code available for payment source | Admitting: Physical Therapy

## 2017-11-03 DIAGNOSIS — M25512 Pain in left shoulder: Secondary | ICD-10-CM | POA: Diagnosis not present

## 2017-11-03 DIAGNOSIS — M6281 Muscle weakness (generalized): Secondary | ICD-10-CM

## 2017-11-03 DIAGNOSIS — G8929 Other chronic pain: Secondary | ICD-10-CM

## 2017-11-03 NOTE — Therapy (Signed)
[facility] [address] [place], [place], [address] Phone: [phone]   Fax:  [phone]  Physical Therapy Treatment  Patient Details  Name: Monica Sandoval MRN: [id number] Date of Birth: [birth year] Referring Provider: Tobey Grim, MD   Encounter Date: 11/03/2017  PT End of Session - 11/03/17 1735    Visit Number  3    Number of Visits  6    Date for PT Re-Evaluation  11/21/17    PT Start Time  1639    PT Stop Time  1720    PT Time Calculation (min)  41 min    Activity Tolerance  Patient tolerated treatment well    Behavior During Therapy  [facility] for tasks assessed/performed       Past Medical History:  Diagnosis Date  . Asthma     Past Surgical History:  Procedure Laterality Date  . CHOLECYSTECTOMY  2008    There were no vitals filed for this visit.  Subjective Assessment - 11/03/17 1641    Subjective    Has 4 buldging discs in neck ,, One presses on  spine.  i am going to get injections in April.     Continues to have good posture .  She has beginning carpal tunnel  LEFT.  MD   thinks tingling id from the neck.     Currently in Pain?  Yes    Pain Score  0-No pain a little tweaking early this am.      Pain Orientation  Left    Pain Descriptors / Indicators  Aching;Headache;Constant    Pain Type  Chronic pain    Pain Radiating Towards  N.T in hand  arm    Pain Frequency  Intermittent    Aggravating Factors   woke that way    Multiple Pain Sites  -- yes see previous                      [facility] Adult PT Treatment/Exercise - 11/03/17 0001      Self-Care   Self-Care  Posture    Posture  posture ed  45 degree angle  for 8 hours increases pressure from head to 45 LBS    showed sitting info too      Neck Exercises: Supine   Other Supine Exercise  Supine stabilizaton 1 series for Neck.  chin tuck with all:  tongue to roof of mouth and look overhead with eyes,,  fist squeeze with shoulder press,   with forearm rotations and pressing fist to hand 10 x each cued       Shoulder Exercises: Supine   Horizontal ABduction  5 reps did not tolerate so stopped    External Rotation  10 reps both yellow band 2/10  pain brief      Manual Therapy   Manual Therapy  Soft tissue mobilization    Manual therapy comments  tape still on patient cannot tell if it helped    Soft tissue mobilization  instrument assist left peri scapular and upper trap release.  Able to decrease pain in one spot in neck             PT Education - 11/03/17 1734    Education provided  Yes    Education Details  HEP,  and posture ed    Person(s) Educated  Patient    Methods  Explanation;Tactile cues;Verbal cues;Handout    Comprehension  Verbalized understanding;Returned demonstration  PT Short Term Goals - 10/10/17 1728      PT SHORT TERM GOAL #1   Title  pt to be I with inital HEP    Time  3    Period  Weeks    Status  New    Target Date  10/31/17      PT SHORT TERM GOAL #2   Title  pt to verbalize/ demo proper posture and lifting mechanics to prevent/ reduce R shoulder pain as well as RICE    Time  3    Period  Weeks    Status  New    Target Date  10/31/17      PT SHORT TERM GOAL #3   Title  increase L grip strength to >/= 80# to demo improvement in shoulder function     Baseline  71# on L    Time  3    Period  Weeks    Status  New    Target Date  10/31/17        PT Long Term Goals - 10/10/17 1729      PT LONG TERM GOAL #1   Title  pt to increase R shoulder abduciton to >/= 120 degrees and shoulder internal rotation to >/= 60 degrees with </= 2/10 pain for functional mobility required for ADLs    Time  6    Period  Weeks    Status  New    Target Date  11/21/17      PT LONG TERM GOAL #2   Title  increase L overall shoulder strength to >/= 4+/5 in all planes to provide stability with liftin/ carrying activities     Time  6    Period  Weeks    Status  New    Target Date  11/21/17       PT LONG TERM GOAL #3   Title  pt to be able to lift and lower >/= 10# from an overhead shelf  and lift/ carrying >/= 15# from the floor with </= 1/10 pain for functional strength required for work     Time  6    Period  Weeks    Status  New    Target Date  11/21/17      PT LONG TERM GOAL #4   Title  increase FOTO score to </= 31% limited to demo improvement in function     Time  6    Period  Weeks    Status  New    Target Date  11/21/17      PT LONG TERM GOAL #5   Title  pt to be I with all HEP given as of last visit to maintain and promote current level of function    Time  6    Period  Weeks    Status  New    Target Date  11/21/17            Plan - 11/03/17 1735    Clinical Impression Statement  Patient cannot tell if tape helped.  Patient did not tolerate scapular stabilization exercises with yellow band today however she did tolerate cervical stabilization exercises.  Patent has 4 buldging disc in neck.  Less neck pain post soft tissue work   .  Patient requires support under left arm  when she is supine.      PT Next Visit Plan  Review cervical stabilization exercises.  manual as needed .  Check specific goals and work toward.  PT Home Exercise Plan  upper trap stretching, chin tucks, rows, ceiling punches, cervical stab 1 ex    Consulted and Agree with Plan of Care  Patient       Patient will benefit from skilled therapeutic intervention in order to improve the following deficits and impairments:     Visit Diagnosis: Chronic left shoulder pain  Muscle weakness (generalized)     Problem List Patient Active Problem List   Diagnosis Date Noted  . Chronic left shoulder pain 09/21/2017  . Acute pain of left knee 09/21/2017  . Current mild episode of major depressive disorder without prior episode (HCC) 05/02/2017  . STD exposure 05/02/2017  . Pelvic cramping 10/28/2016  . Hearing loss 10/17/2009  . HEMORRHOIDS, INTERNAL, WITH BLEEDING 01/12/2008  .  TOBACCO DEPENDENCE 10/20/2006  . TENSION HEADACHE 10/20/2006  . 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

## 2017-11-03 NOTE — Patient Instructions (Signed)
Issues cervical stabilization 1 exercises from exercise drawer  all issued Daily 10 x each Hold 1 to 3 seconds These should not hurt.

## 2017-11-08 ENCOUNTER — Ambulatory Visit: Payer: No Typology Code available for payment source | Admitting: Physical Therapy

## 2017-11-10 ENCOUNTER — Ambulatory Visit: Payer: No Typology Code available for payment source | Admitting: Physical Therapy

## 2017-11-10 ENCOUNTER — Encounter: Payer: Self-pay | Admitting: Physical Therapy

## 2017-11-10 DIAGNOSIS — M25512 Pain in left shoulder: Secondary | ICD-10-CM | POA: Diagnosis not present

## 2017-11-10 DIAGNOSIS — G8929 Other chronic pain: Secondary | ICD-10-CM

## 2017-11-10 DIAGNOSIS — M6281 Muscle weakness (generalized): Secondary | ICD-10-CM

## 2017-11-10 NOTE — Therapy (Signed)
[facility] Sandoval [address] [place], [place], [address] Phone: [phone]   Fax:  [phone]  Physical Therapy Treatment  Patient Details  Name: Monica Sandoval MRN: [id number] Date of Birth: [birth year] Referring Provider: Tobey Grim, MD   Encounter Date: 11/10/2017  PT End of Session - 11/10/17 1731    Visit Number  3    Number of Visits  6    Date for PT Re-Evaluation  11/21/17    PT Start Time  1639    PT Stop Time  1720    PT Time Calculation (min)  41 min    Activity Tolerance  Patient tolerated treatment well    Behavior During Therapy  Mon[facility] for tasks assessed/performed       Past Medical History:  Diagnosis Date  . Asthma     Past Surgical History:  Procedure Laterality Date  . CHOLECYSTECTOMY  2008    There were no vitals filed for this visit.  Subjective Assessment - 11/10/17 1641    Subjective  Missed a visit earlier/  I was really sick with a virus.  i have pink pye  .  I am on medication for it.   Headaches have eased. I took the tape off on Sat.  and within an hour   I could feel a difference.      Currently in Pain?  Yes    Pain Score  3     Pain Location  Shoulder    Pain Orientation  Left    Pain Descriptors / Indicators  -- barely there    Pain Type  Chronic pain    Pain Radiating Towards  No    Aggravating Factors   work activities    Pain Relieving Factors  rest ,, exercise    Effect of Pain on Daily Activities  gets help at work    Multiple Pain Sites  -- neck pain 2/10                      [facility] Adult PT Treatment/Exercise - 11/10/17 0001      Neck Exercises: Supine   Other Supine Exercise  Supine stabilizaton 1 series for Neck.  chin tuck with all:  tongue to roof of mouth and look overhead with eyes,,  fist squeeze with shoulder press,  with forearm rotations and pressing fist to hand 10 x each cued  neck roll or these increased her pain.       Shoulder Exercises:  Supine   Horizontal ABduction  10 reps    External Rotation  10 reps yellow  band    Flexion  10 reps unable to do narrow grip band ,  AROM  rough.  weak      Shoulder Exercises: Pulleys   Flexion  2 minutes      Shoulder Exercises: ROM/Strengthening   UBE (Upper Arm Bike)  reverse 2 minutes  L1      Kinesiotix   Inhibit Muscle   DELTOID,  SUPRASPINATUS also bilateral taping to inhibit  levator.      Ligament Correction  ac JOINT 50 %             PT Education - 11/10/17 1706    Education provided  Yes    Education Details  RICE info given reviewed.      Person(s) Educated  Patient       PT Short Term Goals - 11/10/17 1737  PT SHORT TERM GOAL #1   Title  pt to be I with inital HEP    Baseline  independent with exercises issued so far    Time  3    Period  Weeks    Status  On-going      PT SHORT TERM GOAL #2   Title  pt to verbalize/ demo proper posture and lifting mechanics to prevent/ reduce R shoulder pain as well as RICE    Baseline  Rice ed today    Time  3    Period  Weeks    Status  On-going      PT SHORT TERM GOAL #3   Title  increase L grip strength to >/= 80# to demo improvement in shoulder function     Time  3    Period  Weeks    Status  Unable to assess        PT Long Term Goals - 10/10/17 1729      PT LONG TERM GOAL #1   Title  pt to increase R shoulder abduciton to >/= 120 degrees and shoulder internal rotation to >/= 60 degrees with </= 2/10 pain for functional mobility required for ADLs    Time  6    Period  Weeks    Status  New    Target Date  11/21/17      PT LONG TERM GOAL #2   Title  increase L overall shoulder strength to >/= 4+/5 in all planes to provide stability with liftin/ carrying activities     Time  6    Period  Weeks    Status  New    Target Date  11/21/17      PT LONG TERM GOAL #3   Title  pt to be able to lift and lower >/= 10# from an overhead shelf  and lift/ carrying >/= 15# from the floor with </= 1/10 pain  for functional strength required for work     Time  6    Period  Weeks    Status  New    Target Date  11/21/17      PT LONG TERM GOAL #4   Title  increase FOTO score to </= 31% limited to demo improvement in function     Time  6    Period  Weeks    Status  New    Target Date  11/21/17      PT LONG TERM GOAL #5   Title  pt to be I with all HEP given as of last visit to maintain and promote current level of function    Time  6    Period  Weeks    Status  New    Target Date  11/21/17            Plan - 11/10/17 1732    Clinical Impression Statement  Patient  had no headach today until she did  exercises with rolled towel at neck.  No new exercises added to HEP,  she was able to increase AAROM in shoulder to 120+ on pulleys.  Her MRI results  reveal cervical issues.      PT Next Visit Plan  See [name] next visit.  Check neck /  manual as needed.  progress HEP as able    PT Home Exercise Plan  upper trap stretching, chin tucks, rows, ceiling punches, cervical stab 1 ex    Consulted and Agree with Plan of Care  Patient       Patient will benefit from skilled therapeutic intervention in order to improve the following deficits and impairments:     Visit Diagnosis: Chronic left shoulder pain  Muscle weakness (generalized)     Problem List Patient Active Problem List   Diagnosis Date Noted  . Chronic left shoulder pain 09/21/2017  . Acute pain of left knee 09/21/2017  . Current mild episode of major depressive disorder without prior episode (HCC) 05/02/2017  . STD exposure 05/02/2017  . Pelvic cramping 10/28/2016  . Hearing loss 10/17/2009  . HEMORRHOIDS, INTERNAL, WITH BLEEDING 01/12/2008  . TOBACCO DEPENDENCE 10/20/2006  . TENSION HEADACHE 10/20/2006  . 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 [place], [place], [address] Phone: [phone]   Fax:   [phone]  Name: Monica Sandoval MRN: [id number] Date of Birth: [birth year]

## 2017-11-10 NOTE — Patient Instructions (Signed)
RICE for Routine Care of Injuries Many injuries can be cared for using rest, ice, compression, and elevation (RICE therapy). Using RICE therapy can help to lessen pain and swelling. It can help your body to heal. Rest Reduce your normal activities and avoid using the injured part of your body. You can go back to your normal activities when you feel okay and your doctor says it is okay. Ice Do not put ice on your bare skin.  Put ice in a plastic bag.  Place a towel between your skin and the bag.  Leave the ice on for 20 minutes, 2-3 times a day.  Do this for as long as told by your doctor. Compression Compression means putting pressure on the injured area. This can be done with an elastic bandage. If an elastic bandage has been applied:  Remove and reapply the bandage every 3-4 hours or as told by your doctor.  Make sure the bandage is not wrapped too tight. Wrap the bandage more loosely if part of your body beyond the bandage is blue, swollen, cold, painful, or loses feeling (numb).  See your doctor if the bandage seems to make your problems worse.  Elevation Elevation means keeping the injured area raised. Raise the injured area above your heart or the center of your chest if you can. When should I get help? You should get help if:  You keep having pain and swelling.  Your symptoms get worse.  Get help right away if: You should get help right away if:  You have sudden bad pain at or below the area of your injury.  You have redness or more swelling around your injury.  You have tingling or numbness at or below the injury that does not go away when you take off the bandage.  This information is not intended to replace advice given to you by your health care provider. Make sure you discuss any questions you have with your health care provider. Document Released: 01/26/2008 Document Revised: 07/06/2016 Document Reviewed: 07/17/2014 Elsevier Interactive Patient Education  2017  Elsevier Inc.  

## 2017-11-15 ENCOUNTER — Ambulatory Visit: Payer: No Typology Code available for payment source | Admitting: Physical Therapy

## 2017-11-15 ENCOUNTER — Encounter: Payer: Self-pay | Admitting: Physical Therapy

## 2017-11-15 DIAGNOSIS — M25512 Pain in left shoulder: Secondary | ICD-10-CM | POA: Diagnosis not present

## 2017-11-15 NOTE — Therapy (Signed)
[facility] [address] [place], [place], [address] Phone: [phone]   Fax:  [phone]  Physical Therapy Treatment / Re-certification  Patient Details  Name: Monica Sandoval MRN: [id number] Date of Birth: 11-04-1975 Referring Provider: Tobey Grim, MD   Encounter Date: 11/15/2017  PT End of Session - 11/15/17 1638    Visit Number  4    Number of Visits  12    Date for PT Re-Evaluation  12/13/17    PT Start Time  1632    PT Stop Time  1718    PT Time Calculation (min)  46 min    Activity Tolerance  Patient tolerated treatment well    Behavior During Therapy  [facility] for tasks assessed/performed       Past Medical History:  Diagnosis Date  . Asthma     Past Surgical History:  Procedure Laterality Date  . CHOLECYSTECTOMY  2008    There were no vitals filed for this visit.  Subjective Assessment - 11/15/17 1634    Subjective  "My neck is bothering me and my shoulder has improved and I no long have issues"     Currently in Pain?  Yes    Pain Score  4     Pain Location  Neck    Pain Orientation  Left    Pain Descriptors / Indicators  Aching;Constant    Aggravating Factors   working, any activity with neck movement    Pain Relieving Factors  rest, exercise         [facility] PT Assessment - 11/15/17 1707      AROM   Left Shoulder Extension  68 Degrees    Left Shoulder Flexion  150 Degrees    Left Shoulder ABduction  122 Degrees    Left Shoulder Internal Rotation  40 Degrees assessed in 90/90    Left Shoulder External Rotation  78 Degrees assessed in 90/90    Cervical Flexion  48    Cervical Extension  48    Cervical - Right Side Bend  42    Cervical - Left Side Bend  42    Cervical - Right Rotation  60    Cervical - Left Rotation  55      Strength   Left Shoulder Flexion  4/5    Left Shoulder Extension  4/5    Left Shoulder ABduction  4-/5 pain during testing    Left Shoulder Internal Rotation  4+/5    Left Shoulder External Rotation  4+/5    Left Hand Grip (lbs)  73.6 74,75,72      Special Tests    Special Tests  Sacrolliac Tests;Cervical    Cervical Tests  other;Spurling's;Dictraction    Sacroiliac Tests   --      Spurling's   Findings  Negative      Distraction Test   Findngs  Positive    side  Left    Comment  relief of pain       other    Findings  Positive    Side  Left    Comment  Median nerve ULLT testing             No data recorded       [facility] Adult PT Treatment/Exercise - 11/15/17 1723      Neck Exercises: Supine   Neck Retraction  10 reps x 4 sets, reported reduced pain    Other Supine Exercise  Supine stabilizaton 1 series for  Neck.  chin tuck with all:  tongue to roof of mouth and look overhead with eyes,,  fist squeeze with shoulder press,  with forearm rotations and pressing fist to hand 10 x each cued       Shoulder Exercises: Supine   Flexion  10 reps      Shoulder Exercises: Stretch   Other Shoulder Stretches  Rhomboid stretching 2 x 30 sec      Modalities   Modalities  Traction      Traction   Type of Traction  Cervical    Min (lbs)  7    Max (lbs)  12    Hold Time  60    Rest Time  30    Time  10      Manual Therapy   Manual Therapy  Neural Stretch    Manual therapy comments  manual distraction x 2 min    Neural Stretch  median nerve glides.              PT Education - 11/15/17 1709    Education provided  Yes    Education Details  benefits of mechanical traction    Person(s) Educated  Patient    Methods  Explanation;Verbal cues    Comprehension  Verbalized understanding;Verbal cues required       PT Short Term Goals - 11/15/17 1653      PT SHORT TERM GOAL #1   Title  pt to be I with inital HEP    Time  3    Period  Weeks    Status  Achieved      PT SHORT TERM GOAL #2   Title  pt to verbalize/ demo proper posture and lifting mechanics to prevent/ reduce R shoulder pain as well as RICE    Time  3    Period   Weeks    Status  Achieved      PT SHORT TERM GOAL #3   Title  increase L grip strength to >/= 80# to demo improvement in shoulder function     Baseline  73.6# on L    Period  Weeks    Status  On-going        PT Long Term Goals - 11/15/17 1654      PT LONG TERM GOAL #1   Title  pt to increase R shoulder abduciton to >/= 120 degrees and shoulder internal rotation to >/= 60 degrees with </= 2/10 pain for functional mobility required for ADLs    Time  6    Period  Weeks    Status  On-going      PT LONG TERM GOAL #2   Title  increase L overall shoulder strength to >/= 4+/5 in all planes to provide stability with liftin/ carrying activities     Time  6    Status  On-going    Target Date  12/13/17      PT LONG TERM GOAL #3   Title  pt to be able to lift and lower >/= 10# from an overhead shelf  and lift/ carrying >/= 15# from the floor with </= 1/10 pain for functional strength required for work     Time  6    Period  Weeks    Status  On-going    Target Date  12/13/17      PT LONG TERM GOAL #4   Title  increase FOTO score to </= 31% limited to demo improvement in function  Time  6    Period  Weeks    Status  Unable to assess    Target Date  12/13/17      PT LONG TERM GOAL #5   Title  pt to be I with all HEP given as of last visit to maintain and promote current level of function    Time  6    Period  Weeks    Status  On-going    Target Date  12/13/17      Additional Long Term Goals   Additional Long Term Goals  Yes      PT LONG TERM GOAL #6   Title  pt will be able to maintain proper cervical position and use of bil UE movement/ lifting reporting no pain or referred symtpoms for >/= 1 week to promote function    Time  4    Period  Weeks    Status  New    Target Date  12/13/17            Plan - 11/15/17 1709    Clinical Impression Statement  Monica Sandoval continues to make progress with physical therapy but continues to have intermittent pain which she  reported is worse with lifting and postural positions, specifically looking down for long periods of time. she demonstrated 2/4 positive test cluster for cervical radiculopathy, indicating 50% probability of shoulder pain is referred from the neck. following repeated cervical retractions she reported no pain in the shoulder/ neck. following traction she reported no pain. she would benefit from continued physical therapy to promote shoulder mobility, UE function/ posture to prevent referral from neck, and return pt to PLOF by addressing the deficits listed.     Rehab Potential  Good    PT Frequency  2x / week    PT Duration  4 weeks    PT Treatment/Interventions  ADLs/Self Care Home Management;Cryotherapy;Electrical Stimulation;Iontophoresis 4mg /ml Dexamethasone;Moist Heat;Ultrasound;Passive range of motion;Dry needling;Taping;Therapeutic activities;Therapeutic exercise;Manual techniques;Neuromuscular re-education;Patient/family education;Traction    PT Next Visit Plan   Check neck /  manual as needed.  repeated cervical retraction, could trial MAI, how was cervical traction?, continue shoulder strengtheing and cervical stability.     PT Home Exercise Plan  upper trap stretching, chin tucks, rows, ceiling punches, cervical stab 1 ex    Consulted and Agree with Plan of Care  Patient       Patient will benefit from skilled therapeutic intervention in order to improve the following deficits and impairments:  Pain, Impaired UE functional use, Decreased activity tolerance, Decreased endurance, Postural dysfunction, Decreased range of motion, Decreased strength  Visit Diagnosis: Chronic left shoulder pain  Muscle weakness (generalized)  Cervicalgia     Problem List Patient Active Problem List   Diagnosis Date Noted  . Chronic left shoulder pain 09/21/2017  . Acute pain of left knee 09/21/2017  . Current mild episode of major depressive disorder without prior episode (HCC) 05/02/2017  . STD  exposure 05/02/2017  . Pelvic cramping 10/28/2016  . Hearing loss 10/17/2009  . HEMORRHOIDS, INTERNAL, WITH BLEEDING 01/12/2008  . TOBACCO DEPENDENCE 10/20/2006  . TENSION HEADACHE 10/20/2006  . 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

## 2017-11-17 ENCOUNTER — Encounter: Payer: Self-pay | Admitting: Physical Therapy

## 2017-11-17 ENCOUNTER — Ambulatory Visit: Payer: No Typology Code available for payment source | Admitting: Physical Therapy

## 2017-11-17 DIAGNOSIS — M6281 Muscle weakness (generalized): Secondary | ICD-10-CM

## 2017-11-17 DIAGNOSIS — M25512 Pain in left shoulder: Secondary | ICD-10-CM | POA: Diagnosis not present

## 2017-11-17 DIAGNOSIS — M542 Cervicalgia: Secondary | ICD-10-CM

## 2017-11-17 DIAGNOSIS — G8929 Other chronic pain: Secondary | ICD-10-CM

## 2017-11-17 NOTE — Therapy (Addendum)
[place], [place], [address] Phone: [phone]   Fax:  [phone]  Physical Therapy Treatment / Discharge Summary  Patient Details  Name: Monica Sandoval MRN: [id number] Date of Birth: [birth year] Referring Provider: Alveda Reasons, MD   Encounter Date: 11/17/2017  PT End of Session - 11/17/17 1738    Visit Number  5    Number of Visits  12    Date for PT Re-Evaluation  12/13/17    PT Start Time  1324    PT Stop Time  1745    PT Time Calculation (min)  61 min    Activity Tolerance  Patient tolerated treatment well    Behavior During Therapy  [facility] for tasks assessed/performed       Past Medical History:  Diagnosis Date  . Asthma     Past Surgical History:  Procedure Laterality Date  . CHOLECYSTECTOMY  2008    There were no vitals filed for this visit.  Subjective Assessment - 11/17/17 1637    Subjective  This morning right forearm started with sharp shooting pains  .  I could not open the door because it hurt so bad. It feels like I did a lot of exercises  and it is really really sore.  No left shoulder or neck pain.   Pain increased 2 hours aft last session pain increased to 6-7/10.  When she got up the next morning she had no pain.    Neck was sore only a 2/10 middle.      Currently in Pain?  Yes    Pain Score  1     Pain Location  Neck    Pain Orientation  Left    Pain Descriptors / Indicators  Dull    Pain Type  Chronic pain    Pain Radiating Towards  No    Multiple Pain Sites  Yes    Pain Score  4    Pain Location  Arm    Pain Orientation  Right    Pain Descriptors / Indicators  Sharp feels like I have overworked my forearm    Pain Radiating Towards  elbow to wrist,  tender                 No data recorded       [facility] Adult PT Treatment/Exercise - 11/17/17 0001      Neck Exercises: Supine   Neck Retraction  5 reps      Cryotherapy   Cryotherapy Location  --  forearm,  concurrent with trigger point release    Type of Cryotherapy  -- cold pack      Traction   Type of Traction  Cervical    Min (lbs)  7    Max (lbs)  12    Hold Time  60    Rest Time  30    Time  10      Manual Therapy   Manual Therapy  Neural Stretch medial L1, L2    Manual therapy comments  1st rib mobs with sheet,  HEP gentle cervical traction in supine 1 X increased headach    Soft tissue mobilization  trigger point relea    upper trap    scalenes,  infraspinatu  rhomboids    Neural Stretch  L1, L2 right fist             PT Education - 11/17/17 1738    Education provided  Yes  Education Details  HEP    Person(s) Educated  Patient    Methods  Explanation;Tactile cues;Verbal cues;Handout    Comprehension  Verbalized understanding;Returned demonstration       PT Short Term Goals - 11/15/17 1653      PT SHORT TERM GOAL #1   Title  pt to be I with inital HEP    Time  3    Period  Weeks    Status  Achieved      PT SHORT TERM GOAL #2   Title  pt to verbalize/ demo proper posture and lifting mechanics to prevent/ reduce R shoulder pain as well as RICE    Time  3    Period  Weeks    Status  Achieved      PT SHORT TERM GOAL #3   Title  increase L grip strength to >/= 80# to demo improvement in shoulder function     Baseline  73.6# on L    Period  Weeks    Status  On-going        PT Long Term Goals - 11/15/17 1654      PT LONG TERM GOAL #1   Title  pt to increase R shoulder abduciton to >/= 120 degrees and shoulder internal rotation to >/= 60 degrees with </= 2/10 pain for functional mobility required for ADLs    Time  6    Period  Weeks    Status  On-going      PT LONG TERM GOAL #2   Title  increase L overall shoulder strength to >/= 4+/5 in all planes to provide stability with liftin/ carrying activities     Time  6    Status  On-going    Target Date  12/13/17      PT LONG TERM GOAL #3   Title  pt to be able to lift and lower >/= 10# from  an overhead shelf  and lift/ carrying >/= 15# from the floor with </= 1/10 pain for functional strength required for work     Time  6    Period  Weeks    Status  On-going    Target Date  12/13/17      PT LONG TERM GOAL #4   Title  increase FOTO score to </= 31% limited to demo improvement in function     Time  6    Period  Weeks    Status  Unable to assess    Target Date  12/13/17      PT LONG TERM GOAL #5   Title  pt to be I with all HEP given as of last visit to maintain and promote current level of function    Time  6    Period  Weeks    Status  On-going    Target Date  12/13/17      Additional Long Term Goals   Additional Long Term Goals  Yes      PT LONG TERM GOAL #6   Title  pt will be able to maintain proper cervical position and use of bil UE movement/ lifting reporting no pain or referred symtpoms for >/= 1 week to promote function    Time  4    Period  Weeks    Status  New    Target Date  12/13/17            Plan - 11/17/17 1739    Clinical Impression Statement  Pain pattern  change.  PT Carlus Pavlov checked out patient,  .  traction continued.  No neck pain, No left arm pain.  pain in right forearm recreased throbbing , still present.    PT Next Visit Plan  Assess trsaction and pain.  review 1st rib mobs,  Multi angle Isometrics cervical,  strengthening and cervical stabilization.    PT Home Exercise Plan  upper trap stretching, chin tucks, rows, ceiling punches, cervical stab 1 ex    Consulted and Agree with Plan of Care  Patient       Patient will benefit from skilled therapeutic intervention in order to improve the following deficits and impairments:     Visit Diagnosis: Chronic left shoulder pain  Muscle weakness (generalized)  Cervicalgia     Problem List Patient Active Problem List   Diagnosis Date Noted  . Chronic left shoulder pain 09/21/2017  . Acute pain of left knee 09/21/2017  . Current mild episode of major depressive disorder  without prior episode ([place]) 05/02/2017  . STD exposure 05/02/2017  . Pelvic cramping 10/28/2016  . Hearing loss 10/17/2009  . HEMORRHOIDS, INTERNAL, WITH BLEEDING 01/12/2008  . TOBACCO DEPENDENCE 10/20/2006  . TENSION HEADACHE 10/20/2006  . 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  Patient goals were not met. Patient is being discharged due to not returning since the last visit.  ?????         Kristoffer Leamon PT, DPT, LAT, ATC  02/22/18  9:26 AM

## 2017-11-23 ENCOUNTER — Encounter (INDEPENDENT_AMBULATORY_CARE_PROVIDER_SITE_OTHER): Payer: Self-pay | Admitting: Physical Medicine and Rehabilitation

## 2017-11-23 ENCOUNTER — Ambulatory Visit (INDEPENDENT_AMBULATORY_CARE_PROVIDER_SITE_OTHER): Payer: PRIVATE HEALTH INSURANCE | Admitting: Physical Medicine and Rehabilitation

## 2017-11-23 ENCOUNTER — Ambulatory Visit (INDEPENDENT_AMBULATORY_CARE_PROVIDER_SITE_OTHER): Payer: PRIVATE HEALTH INSURANCE

## 2017-11-23 VITALS — BP 139/86 | HR 86 | Temp 98.3°F

## 2017-11-23 DIAGNOSIS — M5412 Radiculopathy, cervical region: Secondary | ICD-10-CM | POA: Diagnosis not present

## 2017-11-23 MED ORDER — METHYLPREDNISOLONE ACETATE 80 MG/ML IJ SUSP
80.0000 mg | Freq: Once | INTRAMUSCULAR | Status: AC
  Administered 2017-11-23: 80 mg

## 2017-11-23 NOTE — Progress Notes (Signed)
 .  Numeric Pain Rating Scale and Functional Assessment Average Pain 6   In the last MONTH (on 0-10 scale) has pain interfered with the following?  1. General activity like being  able to carry out your everyday physical activities such as walking, climbing stairs, carrying groceries, or moving a chair?  Rating(4)   +Driver, -BT, -Dye Allergies.  

## 2017-11-30 NOTE — Procedures (Signed)
Cervical Epidural Steroid Injection - Interlaminar Approach with Fluoroscopic Guidance  Patient: Monica Sandoval      Date of Birth: 12/26/1975 MRN: [id number] PCP: Arlyce HarmanLockamy, Timothy, DO      Visit Date: 11/23/2017   Universal Protocol:    Date/Time: 04/10/195:24 AM  Consent Given By: the patient  Position: PRONE  Additional Comments: Vital signs were monitored before and after the procedure. Patient was prepped and draped in the usual sterile fashion. The correct patient, procedure, and site was verified.   Injection Procedure Details:  Procedure Site One Meds Administered:  Meds ordered this encounter  Medications  . methylPREDNISolone acetate (DEPO-MEDROL) injection 80 mg     Laterality: Left  Location/Site: C7-T1  Needle size: 20 G  Needle type: Touhy  Needle Placement: Paramedian epidural space  Findings:  -Comments: Excellent flow of contrast into the epidural space.  Procedure Details: Using a paramedian approach from the side mentioned above, the region overlying the inferior lamina was localized under fluoroscopic visualization and the soft tissues overlying this structure were infiltrated with 4 ml. of 1% Lidocaine without Epinephrine. A # 20 gauge, Tuohy needle was inserted into the epidural space using a paramedian approach.  The epidural space was localized using loss of resistance along with lateral and contralateral oblique bi-planar fluoroscopic views.  After negative aspirate for air, blood, and CSF, a 2 ml. volume of Isovue-250 was injected into the epidural space and the flow of contrast was observed. Radiographs were obtained for documentation purposes.   The injectate was administered into the level noted above.  Additional Comments:  The patient tolerated the procedure well Dressing: Band-Aid    Post-procedure details: Patient was observed during the procedure. Post-procedure instructions were reviewed.  Patient left the clinic in stable  condition.

## 2017-11-30 NOTE — Progress Notes (Signed)
Monica Sandoval - 42 y.o. female MRN [id number]  Date of birth: 05/12/1976  Office Visit Note: Visit Date: 11/23/2017 PCP: Arlyce HarmanLockamy, Timothy, DO Referred by: Arlyce HarmanLockamy, Timothy, DO  Subjective: Chief Complaint  Patient presents with  . Neck - Pain   HPI: [name] is a 42 year old right-hand-dominant female that comes in today at the request of Dr. August Saucerean for diagnostic and therapeutic left C7-T1 interlaminar epidural steroid injection.  She has chronic worsening neck and left shoulder pain and arm pain with paresthesia.  We saw her recently for elective diagnostic study that showed chronic C6 radiculopathy although mild.  She also had mild findings of carpal tunnel syndrome or median nerve compression.  We are going to complete a diagnostic injection today and she will follow-up with Dr. August Saucerean accordingly.   ROS Otherwise per HPI.  Assessment & Plan: Visit Diagnoses:  1. Cervical radiculopathy     Plan: No additional findings.   Meds & Orders:  Meds ordered this encounter  Medications  . methylPREDNISolone acetate (DEPO-MEDROL) injection 80 mg    Orders Placed This Encounter  Procedures  . XR C-ARM NO REPORT  . Epidural Steroid injection    Follow-up: Return if symptoms worsen or fail to improve, for Dr. August Saucerean.   Procedures: No procedures performed  Cervical Epidural Steroid Injection - Interlaminar Approach with Fluoroscopic Guidance  Patient: Monica Siadngela Kasparek      Date of Birth: 08/30/1975 MRN: [id number] PCP: Arlyce HarmanLockamy, Timothy, DO      Visit Date: 11/23/2017   Universal Protocol:    Date/Time: 04/10/195:24 AM  Consent Given By: the patient  Position: PRONE  Additional Comments: Vital signs were monitored before and after the procedure. Patient was prepped and draped in the usual sterile fashion. The correct patient, procedure, and site was verified.   Injection Procedure Details:  Procedure Site One Meds Administered:  Meds ordered this encounter  Medications    . methylPREDNISolone acetate (DEPO-MEDROL) injection 80 mg     Laterality: Left  Location/Site: C7-T1  Needle size: 20 G  Needle type: Touhy  Needle Placement: Paramedian epidural space  Findings:  -Comments: Excellent flow of contrast into the epidural space.  Procedure Details: Using a paramedian approach from the side mentioned above, the region overlying the inferior lamina was localized under fluoroscopic visualization and the soft tissues overlying this structure were infiltrated with 4 ml. of 1% Lidocaine without Epinephrine. A # 20 gauge, Tuohy needle was inserted into the epidural space using a paramedian approach.  The epidural space was localized using loss of resistance along with lateral and contralateral oblique bi-planar fluoroscopic views.  After negative aspirate for air, blood, and CSF, a 2 ml. volume of Isovue-250 was injected into the epidural space and the flow of contrast was observed. Radiographs were obtained for documentation purposes.   The injectate was administered into the level noted above.  Additional Comments:  The patient tolerated the procedure well Dressing: Band-Aid    Post-procedure details: Patient was observed during the procedure. Post-procedure instructions were reviewed.  Patient left the clinic in stable condition.   Clinical History: MRI CERVICAL SPINE WITHOUT CONTRAST  TECHNIQUE: Multiplanar, multisequence MR imaging of the cervical spine was performed. No intravenous contrast was administered.  COMPARISON:  Prior radiographs from 02/28/2014.  FINDINGS: Alignment: Straightening of the normal cervical lordosis. No listhesis.  Vertebrae: Vertebral body heights are maintained without evidence for acute or chronic fracture. Bone marrow signal intensity within normal limits. No discrete or worrisome osseous lesions. No abnormal  marrow edema.  Cord: Signal intensity within the cervical spinal cord is  normal.  Posterior Fossa, vertebral arteries, paraspinal tissues: Visualized brain and posterior fossa within normal limits. Craniocervical junction normal. Paraspinous and prevertebral soft tissues are normal. Normal intravascular flow voids present within the vertebral arteries bilaterally.  Disc levels:  C2-C3: Unremarkable.  C3-C4: Tiny central disc protrusion indents the ventral thecal sac. No stenosis.  C4-C5: Broad and somewhat lobulated right paracentral disc protrusion flattens and indents the ventral thecal sac, extending laterally towards the right neural foramen (series 9, image 15). Mild flattening of the cervical spinal cord without cord signal changes. Mild spinal stenosis. Superimposed bilateral uncovertebral hypertrophy. Moderate right C5 foraminal stenosis.  C5-C6: Broad posterior disc protrusion, slightly eccentric to the right. Flattening with partial effacement of the ventral thecal sac. Minimal cord flattening without cord signal changes. Mild spinal stenosis. Right-sided uncovertebral spurring with resultant mild right C6 foraminal stenosis.  C6-C7: Mild disc bulge. No significant canal or neural foraminal stenosis.  C7-T1:  Unremarkable.  Visualized upper thoracic spine within normal limits.  IMPRESSION: 1. Broad right paracentral disc protrusion at C4-5 with resultant mild canal and moderate right C5 foraminal stenosis. 2. Broad posterior disc bulge at C5-6 with resultant mild canal with right C6 foraminal stenosis. 3. Mild disc bulging at C3-4 and C6-7 without significant stenosis.   Electronically Signed   By: Rise Mu M.D.   On: 10/23/2017 22:26   She reports that she has been smoking cigarettes.  She has a 4.00 pack-year smoking history. She has never used smokeless tobacco.  Recent Labs    05/02/17 1705  HGBA1C 5.1    Objective:  VS:  HT:    WT:   BMI:     BP:139/86  HR:86bpm  TEMP:98.3 F (36.8 C)( )   RESP:98 % Physical Exam  Musculoskeletal:  Some painful range of motion of the left shoulder she has good upper extremity strength particularly with wrist extension and long finger flexion abduction.    Ortho Exam Imaging: No results found.  Past Medical/Family/Surgical/Social History: Medications & Allergies reviewed per EMR, new medications updated. Patient Active Problem List   Diagnosis Date Noted  . Chronic left shoulder pain 09/21/2017  . Acute pain of left knee 09/21/2017  . Current mild episode of major depressive disorder without prior episode (HCC) 05/02/2017  . STD exposure 05/02/2017  . Pelvic cramping 10/28/2016  . Hearing loss 10/17/2009  . HEMORRHOIDS, INTERNAL, WITH BLEEDING 01/12/2008  . TOBACCO DEPENDENCE 10/20/2006  . TENSION HEADACHE 10/20/2006  . 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 Asthma    Family History  Problem Relation Age of Onset  . Asthma Mother   . Rheum arthritis Mother   . Arthritis Mother   . Diabetes Father   . Heart disease Father   . Early death Father   . Diabetes Paternal Grandmother   . Heart disease Paternal Grandmother   . Cancer Paternal Grandmother    Past Surgical History:  Procedure Laterality Date  . CHOLECYSTECTOMY  2008   Social History   Occupational History  . Not on file  Tobacco Use  . Smoking status: Current Every Day Smoker    Packs/day: 0.50    Years: 8.00    Pack years: 4.00    Types: Cigarettes  . Smokeless tobacco: Never Used  . Tobacco comment: Sts she quit 09/12/16  Substance and Sexual Activity  . Alcohol use: No  . Drug  use: No  . Sexual activity: Yes    Birth control/protection: IUD

## 2017-12-05 ENCOUNTER — Ambulatory Visit: Payer: No Typology Code available for payment source | Attending: Family Medicine | Admitting: Physical Therapy

## 2017-12-08 ENCOUNTER — Ambulatory Visit: Payer: No Typology Code available for payment source | Admitting: Physical Therapy

## 2017-12-12 ENCOUNTER — Ambulatory Visit: Payer: No Typology Code available for payment source | Admitting: Physical Therapy

## 2017-12-12 ENCOUNTER — Telehealth: Payer: Self-pay | Admitting: Physical Therapy

## 2017-12-12 NOTE — Telephone Encounter (Signed)
Spoke with Monica Sandoval about missing the last 2 appointments. She stated she was unaware of the appointments, and was only notified the day prior when an appointment reminder call was would be made which she stated she couldn't get the time off with a days notice.  I reminded her of her next appointment which she stated due to the increased demand at work she couldn't make ,and to go ahead and cancel it. She would call back in the next few weeks when she can resume.   I told her I could leave her chart open for a few weeks, if we don't hear back from her at the end of those few weeks she will be discharged. If at that point in time she would like to return to PT she will need a new referral.

## 2017-12-14 ENCOUNTER — Ambulatory Visit: Payer: No Typology Code available for payment source | Admitting: Physical Therapy

## 2017-12-19 ENCOUNTER — Encounter: Payer: PRIVATE HEALTH INSURANCE | Admitting: Physical Therapy

## 2017-12-21 ENCOUNTER — Encounter: Payer: PRIVATE HEALTH INSURANCE | Admitting: Physical Therapy

## 2018-03-09 ENCOUNTER — Encounter: Payer: Self-pay | Admitting: Student in an Organized Health Care Education/Training Program

## 2018-03-09 ENCOUNTER — Ambulatory Visit (INDEPENDENT_AMBULATORY_CARE_PROVIDER_SITE_OTHER)
Payer: No Typology Code available for payment source | Admitting: Student in an Organized Health Care Education/Training Program

## 2018-03-09 ENCOUNTER — Other Ambulatory Visit: Payer: Self-pay

## 2018-03-09 VITALS — BP 102/64 | HR 64 | Temp 98.1°F | Ht 63.5 in | Wt 212.6 lb

## 2018-03-09 DIAGNOSIS — M25562 Pain in left knee: Secondary | ICD-10-CM

## 2018-03-09 NOTE — Patient Instructions (Signed)
It was a pleasure seeing you today in our clinic.   A consult was placed to sports medicine at today's visit.  You will receive a call to schedule an appointment. If you do not receive a call within two weeks please call our office so we can place the consult again.  Our clinic's number is [phone]. Please call with questions or concerns about what we discussed today.  Be well, Dr. Mosetta PuttFeng

## 2018-03-10 ENCOUNTER — Encounter: Payer: Self-pay | Admitting: Student in an Organized Health Care Education/Training Program

## 2018-03-10 NOTE — Progress Notes (Signed)
   CC: Left knee pain  HPI: Monica Sandoval is a 42 y.o. female who [facility] to [facility] today with Left knee pain of 7 months duration.  Left Knee Pain Patient reports that she fell on her left knee in December. She has had ongoing medial and frontal knee pain that has worsened since that time.  - Pain is worst when she is walking.  - She reports the pain now wakes her from sleep.  - She does endorse painful popping, and additionally endorses locking and catching in the knee.  - She feels it does swell occasionally.  - She has attempted bracing the knee however she feels the brace has caused further discomfort.  - She takes ibuprofen occasionally for pain but has not had relief  She denies history of surgery or prior injury/trauma to the left knee.  Review of Symptoms:  See HPI for ROS.   CC, SH/smoking status, and VS noted.  Objective: BP 102/64   Pulse 64   Temp 98.1 F (36.7 C) (Oral)   Ht 5' 3.5" (1.613 m)   Wt 212 lb 9.6 oz (96.4 kg)   SpO2 98%   BMI 37.07 kg/m  GEN: NAD, alert, cooperative, and pleasant. NEURO: II-XII grossly intact, normal gait, peripheral sensation intact PSYCH: AAOx3, appropriate affect  Left knee: Normal to inspection with no erythema or obvious effusion, however difficult to assess with body habitus, no obvious bony abnormalities. Palpation with tenderness around medial joint line, no warmth, no patellar tenderness, no condyle tenderness.  ROM full in flexion and extension and lower leg rotation. Ligaments with solid consistent endpoints including ACL, PCL, LCL, MCL. Negative Mcmurray's, Apley's, and Thessalonian tests. Non painful patellar compression. Patellar glide without crepitus. Patellar and quadriceps tendons unremarkable. Hamstring and quadriceps strength is normal.    Assessment and plan:   Left Knee pain Uncertain etiology, may be secondary to meniscal injury. No obvious swelling or effusion however difficult to assess with body  habitus. Ligaments with solid and consistent end points on exam. Given chronicity of symptoms which are now worsening, we will refer to sports medicine for further evaluation and ultrasound. Continue ibuprofen for pain. She could use a sleeve if it helps her knee feel more stable, which may be more comfortable than a brace. Ice as needed for swelling.  Orders Placed This Encounter  Procedures  . Ambulatory referral to Sports Medicine    Referral Priority:   Routine    Referral Type:   Consultation    Number of Visits Requested:   1    Howard PouchLauren Tawan Degroote, MD,MS,  PGY2 03/10/2018 2:09 PM

## 2018-03-15 ENCOUNTER — Ambulatory Visit (INDEPENDENT_AMBULATORY_CARE_PROVIDER_SITE_OTHER): Payer: No Typology Code available for payment source | Admitting: Sports Medicine

## 2018-03-15 VITALS — BP 118/80 | Ht 64.0 in | Wt 213.0 lb

## 2018-03-15 DIAGNOSIS — S8992XA Unspecified injury of left lower leg, initial encounter: Secondary | ICD-10-CM

## 2018-03-15 DIAGNOSIS — M25562 Pain in left knee: Secondary | ICD-10-CM

## 2018-03-15 DIAGNOSIS — G8929 Other chronic pain: Secondary | ICD-10-CM

## 2018-03-15 MED ORDER — NAPROXEN 500 MG PO TABS: 500.0000 mg | ORAL_TABLET | Freq: Two times a day (BID) | ORAL | 1 refills | Status: DC | PRN

## 2018-03-15 NOTE — Patient Instructions (Signed)
X-rays of L knee ordered. F/u in 1-2 weeks to discuss results and possible further intervention including: steroid injection vs follow up imaging ie. MRI to rule out loose body or meniscal injury.  Home exercise program given. Rx for Naproxen 500mg  twice daily given.

## 2018-03-15 NOTE — Progress Notes (Signed)
Monica Sandoval - 42 y.o. female MRN [id number]  Date of birth: 04/10/1976   Chief complaint: Left knee injury  SUBJECTIVE:    History of present illness: Patient is a 42 year old Caucasian female who presents to the sports clinic today with a chief complaint of left knee injury.  She states that in December 2018 she had an injury to her knee in which she fell down the stairs after stepping on a paint can.  She is not clear which way her knee impacted onto the ground however she did have pain afterwards.  A week or 2 later, she also was tripped by her dog and fell on her knee a second time.  She did get swelling of the knee intermittently afterwards.  She also noticed a pain both medially and laterally after this second injury.  Today, she states that she is having medial and lateral knee pain on the left side.  It is described as sharp in nature and is limiting her from doing daily activities like kneeling on her knees as well as walking.  She states she feels a catching sensation of her knee when she extends it as well as popping.  Her pain does wake her up in the middle of the night now which is why she came in for further evaluation.  She has tried ibuprofen 800 mg up to 3 times a day without significant relief.  Increased activity makes her symptoms worse.  She denies any associated hip pain or low back pain.  Denies any numbness or tingling.  Denies any locking or feeling of giving way or instability.  She has not had any diagnostic imaging on this knee yet.  She works at the [employer] and is on concrete floors for her work which worsens her pain.  She is a smoker approximately half a pack per day and has been trying to quit.  She also has a history of degenerative disc disease in her cervical spine.   Review of systems:  As stated above   Past medical history: Gastroesophageal reflux disease, asthma, degenerative disc disease in her cervical spine Past surgical history:  Cholecystectomy Family history: Rheumatoid arthritis in her mother Social history: She is 1/2 pack/day smoker, she works at the [employer] Allergies: No known drug allergies Medications: Ibuprofen, Nexium, albuterol  OBJECTIVE:  Physical exam: Vital signs are reviewed. BP 118/80   Ht 5\' 4"  (1.626 m)   Wt 213 lb (96.6 kg)   BMI 36.56 kg/m   Gen.: Alert, oriented, appears stated age, in no apparent distress HEENT: Moist oral mucosa, conjunctive are clear, no ecchymoses Respiratory: Normal respirations, able to speak in full sentences Cardiac: Regular rate, distal pulses 2+ Integumentary: No rashes, no ecchymoses Neurologic:  Sensation intact light touch L4-S1, no saddle anesthesia  Gait: normal without associated limp Psych: Normal affect, mood is described as good Musculoskeletal: Mild effusion noted of the left knee upon inspection.  No obvious deformity.  Tenderness to palpation over the medial and lateral joint lines as well as in the superior aspect of the patella.  Patient has full range of motion in knee flexion and extension.  Strength testing 5 out of 5 in hip flexors.  5 out of 5 in knee extensors and knee flexors.  Special testing: Negative Lockman, anterior, posterior drawers.  Negative McMurray test.  Negative Thessaly test.  Positive patellar grind.  Positive J-point sign.  Neurovascularly intact.  Negative testing of the left hip and left ankle.  Diagnostics: ULTRASOUND: Knee, L Diagnostic complete ultrasound imaging obtained of patient's L knee.  - Quadriceps tendon: No appreciated signs of tearing, edema, or calcification. + fluid within the suprapatellar pouch with quadriceps contraction.  - Patellar tendon: No appreciated signs of tearing, edema, or calcification. No infrapatellar or tibial tuberosity fluid or abnormality appreciated.  - Medial joint line: No signs concerning for meniscal pathology appreciated. + increased fluid presence noted.  - Lateral  joint line: No signs concerning for meniscal pathology appreciated. + increased fluid presence noted. - Popliteal fossa: No evidence of Baker's cyst formation. Vasculature unremarkable. - MCL: No evidence of integrity loss or abnormal fluid presence.  - LCL: No evidence of integrity loss or abnormal fluid presence.  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    ASSESSMENT & PLAN: 1. L Knee injury, suspect knee osteoarthritis vs loose body vs medial meniscal tear 2. Tobacco use disorder   Plan: Given the patient's history, physical exam, and sonographic findings, I do believe her knee pain is due to probable osteoarthritis.  I am recommending further evaluation with left knee radiographs complete to evaluate for possible arthritis.  If radiographs are negative or demonstrate only mild arthritic disease, the patient may benefit from further work-up including an MRI of the knee to evaluate for loose bodies or meniscal injuries.  If it demonstrates mild to moderate osteoarthritis, the patient may benefit from an intra-articular steroid injection.  I did give her a home exercise program to help with knee and hip strengthening.  She is to complete these for the next 4 weeks.  I also did prescribe naproxen 500 mg twice daily.  I did advise her to stop taking her ibuprofen while taking naproxen.  I also counseled her on the effects of long-term use of anti-inflammatory medications including renal impairment.  She is aware of this.  The patient did decline physical therapy at this time.  I did also offer a corticosteroid injection today however the patient would like to delay this until more definitive diagnosis is made with an x-ray.  The patient was counseled to abstain from smoking as it may delay healing.  She was also counseled on weight loss and improvement in overall body function and decrease stress off of the knee with  continued weight loss.  Patient is in agreement with this plan.  I will see her in the next 1 to 2 weeks for follow-up of her radiographs and further treatment and/or diagnosis.  Gustavus Messing, DO Sports Medicine Fellow [facility]

## 2018-03-20 ENCOUNTER — Ambulatory Visit
Admission: RE | Admit: 2018-03-20 | Discharge: 2018-03-20 | Disposition: A | Discharge status: Home / Self Care | Payer: No Typology Code available for payment source | Source: Ambulatory Visit | Attending: Sports Medicine | Admitting: Sports Medicine

## 2018-03-20 DIAGNOSIS — G8929 Other chronic pain: Secondary | ICD-10-CM

## 2018-03-20 DIAGNOSIS — M25562 Pain in left knee: Principal | ICD-10-CM

## 2018-03-22 ENCOUNTER — Encounter: Payer: Self-pay | Admitting: Sports Medicine

## 2018-03-22 ENCOUNTER — Ambulatory Visit (INDEPENDENT_AMBULATORY_CARE_PROVIDER_SITE_OTHER): Payer: No Typology Code available for payment source | Admitting: Sports Medicine

## 2018-03-22 VITALS — BP 108/80 | Ht 65.0 in | Wt 213.0 lb

## 2018-03-22 DIAGNOSIS — M1712 Unilateral primary osteoarthritis, left knee: Secondary | ICD-10-CM | POA: Insufficient documentation

## 2018-03-22 MED ORDER — METHYLPREDNISOLONE ACETATE 40 MG/ML IJ SUSP: 40.0000 mg | Freq: Once | INTRAMUSCULAR | Status: DC

## 2018-03-22 NOTE — Progress Notes (Signed)
Monica Sandoval - 42 y.o. female MRN [id number]  Date of birth: 1976/01/25   Chief complaint: L knee pain  SUBJECTIVE:    History of present illness: Monica Sandoval is a 42 year old female who presents today for follow-up of left knee pain.  She did have a left knee injury back in December 2018 where she fell down the stairs after stepping on a paint can.  Her symptoms did resolve spontaneously however she had a repeat injury approximately 2 weeks later when she tripped over her dog.  Since those initial injuries, she has had on and off symptoms of anterior knee pain, medial and lateral knee pain worse with ambulation and climbing stairs.  She states she also is unable to kneel on her knees secondary to pain.  She does get an aching sensation at night that will wake her up.  She has tried management with ibuprofen and naproxen with minimal relief.  Relative rest seems to improve her symptoms.  She has not yet had a knee injection.  An ultrasound was performed last visit which demonstrated a joint effusion with patellofemoral osteoarthritis.  There are no acute fractures or meniscal tears visualized.  No Baker's cyst.  X-rays were ordered from last visit.  She is here today to review these.  She denies any new injury or new symptoms.  Denies any numbness or tingling of her knee or left lower extremity.  Denies any low back pain or hip pain.  No loss of bowel or bladder function.  She is a smoker.   Review of systems:  As stated above   Interval past medical history, surgical history, family history, and social history obtained and are unchanged.   OBJECTIVE:  Physical exam: Vital signs are reviewed. BP 108/80   Ht 5\' 5"  (1.651 m)   Wt 213 lb (96.6 kg)   BMI 35.45 kg/m   Gen.: Alert, oriented, appears stated age, in no apparent distress Respiratory: unlabored Integumentary: No rashes  Gait: normal without associated limp Musculoskeletal: Inspection of the knee demonstrates a mild effusion without  ecchymosis or deformity. Medial, Lateral, and patellofemoral joint line tenderness upon palpation. Full ROM in knee and hip flexion and extension. Full ROM in ankle DF/PF. Strength 5/5 in Knee flexion/extension. 5/5 Hip flexion and extension. 5/5 Hip abduction and adduction. Special tests: Neg Lachman. Neg Ant/Post drawer. Neg McMurray. Neg Thessaly. Neg Varus/Valgus. + Patellar grind. Neg Fabre and Fadir testing.  Neurologic: Sensation intact to light touch L4-S1. Reflexes: L4 +2/4, S1 +2/4  X-rays weightbearing of the left knee: Demonstrate tricompartmental arthritis mild to moderate.  No acute fractures.   ASSESSMENT & PLAN: L knee tricompartmental arthritis   Plan: Today we are reviewed the patient's x-ray images.  They demonstrate mild to moderate osteoarthritis in the medial joint line, mild in the lateral joint line, and mild to moderate with spurring in the patellofemoral region.  Given the patient's symptoms, the duration of symptoms, her physical exam, and radiographic findings, I do believe her pain is best described by tricompartmental arthritis.  After a discussion of the risks and benefits of a intra-articular knee injection with the patient, she decided to proceed with this.  INJECTION: Patient was given informed consent, signed copy in the chart. Appropriate time out was taken. Area prepped and draped in usual sterile fashion. 1cc of methylprednisolone 40 mg/ml plus  3cc of 1% lidocaine without epinephrine was injected into the L knee using a(n) medial approach. The patient tolerated the procedure well. There were no complications.  Post procedure instructions were given.  Patient was instructed to perform knee stabilizing exercises that she was given at her last visit and focus on knee, hip, and core muscle strengthening. She is to stop the naproxen as it was not helping her and she may continue to take motrin 600-800 mg up to three times a day for the next 3 weeks. She was also fitted  for a left knee hinged brace today. I am setting a tentative follow up for 2-3 months for the patient. If she does not achieve much relief or her pain worsens, will consider follow up with further imaging ie. MRI. Patient is in agreement.  Gustavus MessingAJ Pinney, DO Sports Medicine Fellow [facility]

## 2018-07-07 ENCOUNTER — Telehealth (INDEPENDENT_AMBULATORY_CARE_PROVIDER_SITE_OTHER): Payer: Self-pay | Admitting: *Deleted

## 2018-07-10 NOTE — Telephone Encounter (Signed)
If it helped then ok to repeat

## 2018-07-11 NOTE — Telephone Encounter (Signed)
Pt is scheduled for 07/24/18 with driver and no BT

## 2018-07-24 ENCOUNTER — Encounter (INDEPENDENT_AMBULATORY_CARE_PROVIDER_SITE_OTHER): Payer: Self-pay | Admitting: Physical Medicine and Rehabilitation

## 2018-07-24 ENCOUNTER — Ambulatory Visit (INDEPENDENT_AMBULATORY_CARE_PROVIDER_SITE_OTHER): Payer: PRIVATE HEALTH INSURANCE | Admitting: Physical Medicine and Rehabilitation

## 2018-07-24 ENCOUNTER — Ambulatory Visit (INDEPENDENT_AMBULATORY_CARE_PROVIDER_SITE_OTHER): Payer: Self-pay

## 2018-07-24 VITALS — BP 120/76 | HR 81 | Temp 98.3°F

## 2018-07-24 DIAGNOSIS — M5412 Radiculopathy, cervical region: Secondary | ICD-10-CM

## 2018-07-24 MED ORDER — METHYLPREDNISOLONE ACETATE 80 MG/ML IJ SUSP
80.0000 mg | Freq: Once | INTRAMUSCULAR | Status: AC
  Administered 2018-07-24: 80 mg

## 2018-07-24 NOTE — Patient Instructions (Signed)

## 2018-07-24 NOTE — Progress Notes (Signed)
 .  Numeric Pain Rating Scale and Functional Assessment Average Pain 4   In the last MONTH (on 0-10 scale) has pain interfered with the following?  1. General activity like being  able to carry out your everyday physical activities such as walking, climbing stairs, carrying groceries, or moving a chair?  Rating(6)   +Driver, -BT, -Dye Allergies.  

## 2018-07-25 NOTE — Progress Notes (Signed)
Monica Sandoval - 42 y.o. female MRN [id number]  Date of birth: 01/27/1976  Office Visit Note: Visit Date: 07/24/2018 PCP: Arlyce HarmanLockamy, Timothy, DO Referred by: Arlyce HarmanLockamy, Timothy, DO  Subjective: Chief Complaint  Patient presents with  . Neck - Pain  . Right Arm - Pain  . Left Arm - Pain  . Right Hand - Numbness  . Left Hand - Numbness   HPI:  Monica Sandoval is a 42 y.o. female who comes in today comes in today  for diagnostic and therapeutic left C7-T1 interlaminar epidural steroid injection.  Prior injection performed in April gave her good relief of her neck shoulder pain and even the tingling in the hands.  Prior electrodiagnostic study showed very mild C6 radiculopathy as well as mild median nerve neuropathy.  She is followed mainly by Dr. Burnard BuntingG. Scott Dean but has not seen him in follow-up since the last injection.  She has had no new trauma.  She reports symptoms started 2 weeks ago when she was very happy with the results of the injection.  We will repeat this today.  ROS Otherwise per HPI.  Assessment & Plan: Visit Diagnoses:  1. Cervical radiculopathy     Plan: No additional findings.   Meds & Orders:  Meds ordered this encounter  Medications  . methylPREDNISolone acetate (DEPO-MEDROL) injection 80 mg    Orders Placed This Encounter  Procedures  . XR C-ARM NO REPORT  . Epidural Steroid injection    Follow-up: Return if symptoms worsen or fail to improve.   Procedures: No procedures performed  Cervical Epidural Steroid Injection - Interlaminar Approach with Fluoroscopic Guidance  Patient: Monica Sandoval      Date of Birth: 05/11/1976 MRN: [id number] PCP: Arlyce HarmanLockamy, Timothy, DO      Visit Date: 07/24/2018   Universal Protocol:    Date/Time: 12/03/196:08 AM  Consent Given By: the patient  Position: PRONE  Additional Comments: Vital signs were monitored before and after the procedure. Patient was prepped and draped in the usual sterile fashion. The correct patient,  procedure, and site was verified.   Injection Procedure Details:  Procedure Site One Meds Administered:  Meds ordered this encounter  Medications  . methylPREDNISolone acetate (DEPO-MEDROL) injection 80 mg     Laterality: Left  Location/Site: C7-T1  Needle size: 20 G  Needle type: Touhy  Needle Placement: Paramedian epidural space  Findings:  -Comments: Excellent flow of contrast into the epidural space.  Procedure Details: Using a paramedian approach from the side mentioned above, the region overlying the inferior lamina was localized under fluoroscopic visualization and the soft tissues overlying this structure were infiltrated with 4 ml. of 1% Lidocaine without Epinephrine. A # 20 gauge, Tuohy needle was inserted into the epidural space using a paramedian approach.  The epidural space was localized using loss of resistance along with lateral and contralateral oblique bi-planar fluoroscopic views.  After negative aspirate for air, blood, and CSF, a 2 ml. volume of Isovue-250 was injected into the epidural space and the flow of contrast was observed. Radiographs were obtained for documentation purposes.   The injectate was administered into the level noted above.  Additional Comments:  The patient tolerated the procedure well Dressing: Band-Aid    Post-procedure details: Patient was observed during the procedure. Post-procedure instructions were reviewed.  Patient left the clinic in stable condition.    Clinical History: MRI CERVICAL SPINE WITHOUT CONTRAST  TECHNIQUE: Multiplanar, multisequence MR imaging of the cervical spine was performed. No intravenous contrast was administered.  COMPARISON:  Prior radiographs from 02/28/2014.  FINDINGS: Alignment: Straightening of the normal cervical lordosis. No listhesis.  Vertebrae: Vertebral body heights are maintained without evidence for acute or chronic fracture. Bone marrow signal intensity within normal  limits. No discrete or worrisome osseous lesions. No abnormal marrow edema.  Cord: Signal intensity within the cervical spinal cord is normal.  Posterior Fossa, vertebral arteries, paraspinal tissues: Visualized brain and posterior fossa within normal limits. Craniocervical junction normal. Paraspinous and prevertebral soft tissues are normal. Normal intravascular flow voids present within the vertebral arteries bilaterally.  Disc levels:  C2-C3: Unremarkable.  C3-C4: Tiny central disc protrusion indents the ventral thecal sac. No stenosis.  C4-C5: Broad and somewhat lobulated right paracentral disc protrusion flattens and indents the ventral thecal sac, extending laterally towards the right neural foramen (series 9, image 15). Mild flattening of the cervical spinal cord without cord signal changes. Mild spinal stenosis. Superimposed bilateral uncovertebral hypertrophy. Moderate right C5 foraminal stenosis.  C5-C6: Broad posterior disc protrusion, slightly eccentric to the right. Flattening with partial effacement of the ventral thecal sac. Minimal cord flattening without cord signal changes. Mild spinal stenosis. Right-sided uncovertebral spurring with resultant mild right C6 foraminal stenosis.  C6-C7: Mild disc bulge. No significant canal or neural foraminal stenosis.  C7-T1:  Unremarkable.  Visualized upper thoracic spine within normal limits.  IMPRESSION: 1. Broad right paracentral disc protrusion at C4-5 with resultant mild canal and moderate right C5 foraminal stenosis. 2. Broad posterior disc bulge at C5-6 with resultant mild canal with right C6 foraminal stenosis. 3. Mild disc bulging at C3-4 and C6-7 without significant stenosis.   Electronically Signed   By: Rise Mu M.D.   On: 10/23/2017 22:26     Objective:  VS:  HT:    WT:   BMI:     BP:120/76  HR:81bpm  TEMP:98.3 F (36.8 C)(Oral)  RESP:  Physical Exam  Ortho  Exam Imaging: Xr C-arm No Report  Result Date: 07/24/2018 Please see Notes tab for imaging impression.

## 2018-07-25 NOTE — Procedures (Signed)
Cervical Epidural Steroid Injection - Interlaminar Approach with Fluoroscopic Guidance  Patient: Monica Sandoval      Date of Birth: 08/31/1975 MRN: [id number] PCP: Arlyce HarmanLockamy, Timothy, DO      Visit Date: 07/24/2018   Universal Protocol:    Date/Time: 12/03/196:08 AM  Consent Given By: the patient  Position: PRONE  Additional Comments: Vital signs were monitored before and after the procedure. Patient was prepped and draped in the usual sterile fashion. The correct patient, procedure, and site was verified.   Injection Procedure Details:  Procedure Site One Meds Administered:  Meds ordered this encounter  Medications  . methylPREDNISolone acetate (DEPO-MEDROL) injection 80 mg     Laterality: Left  Location/Site: C7-T1  Needle size: 20 G  Needle type: Touhy  Needle Placement: Paramedian epidural space  Findings:  -Comments: Excellent flow of contrast into the epidural space.  Procedure Details: Using a paramedian approach from the side mentioned above, the region overlying the inferior lamina was localized under fluoroscopic visualization and the soft tissues overlying this structure were infiltrated with 4 ml. of 1% Lidocaine without Epinephrine. A # 20 gauge, Tuohy needle was inserted into the epidural space using a paramedian approach.  The epidural space was localized using loss of resistance along with lateral and contralateral oblique bi-planar fluoroscopic views.  After negative aspirate for air, blood, and CSF, a 2 ml. volume of Isovue-250 was injected into the epidural space and the flow of contrast was observed. Radiographs were obtained for documentation purposes.   The injectate was administered into the level noted above.  Additional Comments:  The patient tolerated the procedure well Dressing: Band-Aid    Post-procedure details: Patient was observed during the procedure. Post-procedure instructions were reviewed.  Patient left the clinic in stable  condition.

## 2018-08-21 ENCOUNTER — Ambulatory Visit (INDEPENDENT_AMBULATORY_CARE_PROVIDER_SITE_OTHER): Payer: No Typology Code available for payment source | Admitting: Family Medicine

## 2018-08-21 ENCOUNTER — Other Ambulatory Visit: Payer: Self-pay

## 2018-08-21 ENCOUNTER — Ambulatory Visit (HOSPITAL_COMMUNITY)
Admission: RE | Admit: 2018-08-21 | Discharge: 2018-08-21 | Disposition: A | Discharge status: Home / Self Care | Payer: No Typology Code available for payment source | Source: Ambulatory Visit | Attending: Family Medicine | Admitting: Family Medicine

## 2018-08-21 VITALS — BP 100/70 | HR 82 | Temp 97.7°F | Wt 210.0 lb

## 2018-08-21 DIAGNOSIS — R0789 Other chest pain: Secondary | ICD-10-CM | POA: Diagnosis not present

## 2018-08-21 DIAGNOSIS — F17200 Nicotine dependence, unspecified, uncomplicated: Secondary | ICD-10-CM | POA: Diagnosis not present

## 2018-08-21 DIAGNOSIS — R079 Chest pain, unspecified: Secondary | ICD-10-CM | POA: Insufficient documentation

## 2018-08-21 DIAGNOSIS — J069 Acute upper respiratory infection, unspecified: Secondary | ICD-10-CM

## 2018-08-21 DIAGNOSIS — B9789 Other viral agents as the cause of diseases classified elsewhere: Secondary | ICD-10-CM

## 2018-08-21 DIAGNOSIS — J440 Chronic obstructive pulmonary disease with acute lower respiratory infection: Secondary | ICD-10-CM

## 2018-08-21 MED ORDER — ALBUTEROL SULFATE HFA 108 (90 BASE) MCG/ACT IN AERS: 2.0000 | INHALATION_SPRAY | RESPIRATORY_TRACT | 1 refills | Status: DC | PRN

## 2018-08-21 NOTE — Progress Notes (Signed)
Subjective:   Patient ID: Monica Sandoval    DOB: 06/26/1976, 42 y.o. female   MRN: [id number]  Monica Sandoval is a 42 y.o. female with a history of OA, tobacco use, depression, chronic shoulder pain, cervical bulging disc here for   CC: R sided chest pain, cough, congestion  HPI Patient endorses right-sided chest pain for the past 2 weeks that is dull in character with associated fatigue, headache, congestion, coughing for the past 2-3 weeks and right ear pain.  States her pain is exacerbated when she moves her right arm inward.  Endorses her cough is productive.  No fevers.  States sometimes she will have greenish and brownish discharge from her right ear.  Has a history of "deteriating bone inside R ear." Denies radiation of chest pain but does report some pain with deep inspiration.  Occasionally will feel nauseous but denies vomiting.  Has history of asthma and has been without her inhaler.  Does report some difficulty breathing but is able to do all of her normal activities.  Denies swelling or pain in legs.  Has history of acid reflux, takes Nexium.  Denies palpitations, syncope.  States there is a woman at work he had chest pain 3-4 weeks ago and was told she had an infection around her heart and was on antibiotics and told she was contagious.  She wants to make sure she does not have a similar infection.  Does have extensive history of smoking, is planning to quit at the beginning of the year.  Family history of cardiac issues significant for father dying at 42yo of MI.    Review of Systems:  Per HPI.  PMFSH, medications and smoking status reviewed.  Objective:   BP 100/70   Pulse 82   Temp 97.7 F (36.5 C) (Oral)   Wt 210 lb (95.3 kg)   SpO2 99%   BMI 34.95 kg/m  Vitals and nursing note reviewed.  General: Obese female, in no acute distress with non-toxic appearance HEENT: normocephalic, atraumatic, moist mucous membranes. TMs clear bilaterally.  Oropharynx clear without erythema  or exudate. Neck: supple, non-tender with bilateral submandibular lymphadenopathy CV: regular rate and rhythm without murmurs, rubs, or gallops. Chest pain nonreproducible. Lungs: clear to auscultation bilaterally with normal work of breathing.  No wheezing.  Appropriately saturated on room. Abdomen: soft, non-tender, normoactive bowel sounds Skin: warm, dry, no rashes or lesions Extremities: warm and well perfused, normal tone MSK: ROM grossly intact, strength intact, gait normal.   Neuro: Alert and oriented, speech normal  Assessment & Plan:   TOBACCO DEPENDENCE Previously tried the vape pen with some success, had quit for 3 weeks but discontinued after hearing deaths associated with vaping.  Wants to try other options.  Interested in making appointment with Dr. Raymondo BandKoval for further cessation counseling, information provided.  Chest pain Atypical, right-sided. No personal history of diabetes, hypertension, hyperlipidemia or CAD. With history of smoking and family history of early cardiac issues, obtained EKG which was normal. No abnormal lung findings or fevers to suggest pneumonia.  Normal vitals argue against aortic dissection or PE.  No leg swelling or pain to suggest clot formation.  Given duration in association with cough and congestion, likely related to viral URI.  Encouraged symptomatic treatment.  Return precautions reviewed.  Viral URI Cough, congestion, chest pain consistent with viral URI.  Encouraged symptomatic treatment with conservative measures and OTC cough and cold medication.  Return precautions reviewed.  Orders Placed This Encounter  Procedures  . EKG  12-Lead    Ordered by an unspecified provider    Meds ordered this encounter  Medications  . albuterol (PROVENTIL HFA;VENTOLIN HFA) 108 (90 Base) MCG/ACT inhaler    Sig: Inhale 2 puffs into the lungs every 4 (four) hours as needed for wheezing.    Dispense:  1 Inhaler    Refill:  1    Ellwood DenseAlison Hakeem Frazzini, DO PGY-2,  [facility] 08/21/2018 11:10 AM

## 2018-08-21 NOTE — Assessment & Plan Note (Signed)
Previously tried the vape pen with some success, had quit for 3 weeks but discontinued after hearing deaths associated with vaping.  Wants to try other options.  Interested in making appointment with Dr. Raymondo BandKoval for further cessation counseling, information provided.

## 2018-08-21 NOTE — Patient Instructions (Addendum)
It was great to see you!  Our plans for today:  - It is likely your chest pain is from your chest congestion and cough. Your EKG was normal. You should start to feel better in about a week, if you are not feeling improved, come back to see us. - If you start to have sudden chest pain that is worse or more difficulty breathing, you should go to the ED. - We refilled your albuterol.  Take care and seek immediate care sooner if you develop any concerns.   Dr. Mollie Germanyumball [facility]

## 2018-08-21 NOTE — Assessment & Plan Note (Addendum)
Atypical, right-sided. No personal history of diabetes, hypertension, hyperlipidemia or CAD. With history of smoking and family history of early cardiac issues, obtained EKG which was normal. No abnormal lung findings or fevers to suggest pneumonia.  Normal vitals argue against aortic dissection or PE.  No leg swelling or pain to suggest clot formation.  Given duration in association with cough and congestion, likely related to viral URI.  Encouraged symptomatic treatment.  Return precautions reviewed.

## 2018-10-02 ENCOUNTER — Ambulatory Visit (INDEPENDENT_AMBULATORY_CARE_PROVIDER_SITE_OTHER): Payer: No Typology Code available for payment source | Admitting: Family Medicine

## 2018-10-02 ENCOUNTER — Telehealth: Payer: Self-pay

## 2018-10-02 VITALS — BP 100/65 | HR 84 | Temp 98.4°F | Wt 215.0 lb

## 2018-10-02 DIAGNOSIS — E785 Hyperlipidemia, unspecified: Secondary | ICD-10-CM | POA: Diagnosis not present

## 2018-10-02 DIAGNOSIS — M1712 Unilateral primary osteoarthritis, left knee: Secondary | ICD-10-CM | POA: Diagnosis not present

## 2018-10-02 DIAGNOSIS — R5383 Other fatigue: Secondary | ICD-10-CM

## 2018-10-02 DIAGNOSIS — F17200 Nicotine dependence, unspecified, uncomplicated: Secondary | ICD-10-CM

## 2018-10-02 DIAGNOSIS — Z23 Encounter for immunization: Secondary | ICD-10-CM

## 2018-10-02 DIAGNOSIS — J209 Acute bronchitis, unspecified: Secondary | ICD-10-CM

## 2018-10-02 DIAGNOSIS — J440 Chronic obstructive pulmonary disease with acute lower respiratory infection: Secondary | ICD-10-CM

## 2018-10-02 MED ORDER — ALBUTEROL SULFATE HFA 108 (90 BASE) MCG/ACT IN AERS: 2.0000 | INHALATION_SPRAY | RESPIRATORY_TRACT | 1 refills | Status: DC | PRN

## 2018-10-02 MED ORDER — VARENICLINE TARTRATE 0.5 MG X 11 & 1 MG X 42 PO MISC: ORAL | 0 refills | Status: DC

## 2018-10-02 NOTE — Patient Instructions (Addendum)
It was great to see you today! Thank you for letting me participate in your care!  Today, we discussed quitting smoking and I am so pleased you are continuing your journey and moving toward being tobacco free!  Please pick up the prescription for Chantix I have sent to the pharmacy and use as directed.   I will call you with your lab results if anything is abnormal.  Be well, Jules Schick, DO PGY-2, [facility]  Coping with Quitting Smoking  Quitting smoking is a physical and mental challenge. You will face cravings, withdrawal symptoms, and temptation. Before quitting, work with your health care provider to make a plan that can help you cope. Preparation can help you quit and keep you from giving in. How can I cope with cravings? Cravings usually last for 5-10 minutes. If you get through it, the craving will pass. Consider taking the following actions to help you cope with cravings:  Keep your mouth busy: ? Chew sugar-free gum. ? Suck on hard candies or a straw. ? Brush your teeth.  Keep your hands and body busy: ? Immediately change to a different activity when you feel a craving. ? Squeeze or play with a ball. ? Do an activity or a hobby, like making bead jewelry, practicing needlepoint, or working with wood. ? Mix up your normal routine. ? Take a short exercise break. Go for a quick walk or run up and down stairs. ? Spend time in public places where smoking is not allowed.  Focus on doing something kind or helpful for someone else.  Call a friend or family member to talk during a craving.  Join a support group.  Call a quit line, such as 1-800-QUIT-NOW.  Talk with your health care provider about medicines that might help you cope with cravings and make quitting easier for you. How can I deal with withdrawal symptoms? Your body may experience negative effects as it tries to get used to not having nicotine in the system. These effects are called withdrawal  symptoms. They may include:  Feeling hungrier than normal.  Trouble concentrating.  Irritability.  Trouble sleeping.  Feeling depressed.  Restlessness and agitation.  Craving a cigarette. To manage withdrawal symptoms:  Avoid places, people, and activities that trigger your cravings.  Remember why you want to quit.  Get plenty of sleep.  Avoid coffee and other caffeinated drinks. These may worsen some of your symptoms. How can I handle social situations? Social situations can be difficult when you are quitting smoking, especially in the first few weeks. To manage this, you can:  Avoid parties, bars, and other social situations where people might be smoking.  Avoid alcohol.  Leave right away if you have the urge to smoke.  Explain to your family and friends that you are quitting smoking. Ask for understanding and support.  Plan activities with friends or family where smoking is not an option. What are some ways I can cope with stress? Wanting to smoke may cause stress, and stress can make you want to smoke. Find ways to manage your stress. Relaxation techniques can help. For example:  Breathe slowly and deeply, in through your nose and out through your mouth.  Listen to soothing, relaxing music.  Talk with a family member or friend about your stress.  Light a candle.  Soak in a bath or take a shower.  Think about a peaceful place. What are some ways I can prevent weight gain? Be aware that  many people gain weight after they quit smoking. However, not everyone does. To keep from gaining weight, have a plan in place before you quit and stick to the plan after you quit. Your plan should include:  Having healthy snacks. When you have a craving, it may help to: ? Eat plain popcorn, crunchy carrots, celery, or other cut vegetables. ? Chew sugar-free gum.  Changing how you eat: ? Eat small portion sizes at meals. ? Eat 4-6 small meals throughout the day instead of  1-2 large meals a day. ? Be mindful when you eat. Do not watch television or do other things that might distract you as you eat.  Exercising regularly: ? Make time to exercise each day. If you do not have time for a long workout, do short bouts of exercise for 5-10 minutes several times a day. ? Do some form of strengthening exercise, like weight lifting, and some form of aerobic exercise, like running or swimming.  Drinking plenty of water or other low-calorie or no-calorie drinks. Drink 6-8 glasses of water daily, or as much as instructed by your health care provider. Summary  Quitting smoking is a physical and mental challenge. You will face cravings, withdrawal symptoms, and temptation to smoke again. Preparation can help you as you go through these challenges.  You can cope with cravings by keeping your mouth busy (such as by chewing gum), keeping your body and hands busy, and making calls to family, friends, or a helpline for people who want to quit smoking.  You can cope with withdrawal symptoms by avoiding places where people smoke, avoiding drinks with caffeine, and getting plenty of rest.  Ask your health care provider about the different ways to prevent weight gain, avoid stress, and handle social situations. This information is not intended to replace advice given to you by your health care provider. Make sure you discuss any questions you have with your health care provider. Document Released: 08/06/2016 Document Revised: 08/06/2016 Document Reviewed: 08/06/2016 Elsevier Interactive Patient Education  2019 [employer].

## 2018-10-02 NOTE — Telephone Encounter (Signed)
Received fax from [facility], PA needed on Chantix Starting Pak. According to Cover My Meds info these are preferred alternatives:  Preferred products are: NICOTINE PATCH, NICOTINE LOZENGE,  NICOTINE GUM  Please send in preferred alternative or let RN clinic know if you'd like to attempt PA.  Key: [id number]  Ples Specter, RN [facility] RN)

## 2018-10-02 NOTE — Progress Notes (Signed)
Subjective: CC: Smoking cessation and high cholesterol  HPI: Monica Sandoval is a 43 y.o. presenting to clinic today to discuss the following:  Smoking Cessation Patient is still smoking but down to 1/2 pack per day. She is motivated to quit and wants to quit saying "I know I need to and I want to but its hard". She endorses known triggers such as long car rides, a husband that currently smokes, and stress and anxiety from work. She has tried nicorette gum and patches in the past and they have not helped. She is interested in starting Chantix today. Patient does endorse some fatigue.  HLD Patient would like her lipids checked today. She last ate something before noon today.  She denies fever, chills, chest pain, SOB, cough, abdominal pain, nausea, vomiting, urinary pain, frequency, or urgency, diarrhea, constipation, or blood in the stool.  Health Maintenance: Influenza vaccine    ROS noted in HPI.   Past Medical, Surgical, Social, and Family History Reviewed & Updated per EMR.   Pertinent Historical Findings include:   Social History   Tobacco Use  Smoking Status Current Every Day Smoker  . Packs/day: 0.50  . Years: 8.00  . Pack years: 4.00  . Types: Cigarettes  Smokeless Tobacco Never Used  Tobacco Comment   Sts she quit 09/12/16   Objective: BP 100/65   Pulse 84   Temp 98.4 F (36.9 C) (Oral)   Wt 215 lb (97.5 kg)   SpO2 97%   BMI 35.78 kg/m  Vitals and nursing notes reviewed  Physical Exam Gen: Alert and Oriented x 3, NAD HEENT: Normocephalic, atraumatic, PERRLA, EOMI, TM visible with good light reflex, non-swollen, non-erythematous turbinates, non-erythematous pharyngeal mucosa, no exudates Neck: trachea midline, no thyroidmegaly, no LAD CV: RRR, no murmurs, normal S1, S2 split Resp: NOWB, mild wheezing in lower lung fields bilaterally, no rales, or rhonchi Abd: non-distended, non-tender, soft, +bs in all four quadrants Ext: no clubbing, cyanosis, or  edema Skin: warm, dry, intact, no rashes  Results for orders placed or performed in visit on 10/02/18 (from the past 72 hour(s))  Lipid Panel     Status: Abnormal   Collection Time: 10/02/18  4:24 PM  Result Value Ref Range   Cholesterol, Total 183 100 - 199 mg/dL   Triglycerides 683 0 - 149 mg/dL   HDL 49 >41 mg/dL   VLDL Cholesterol Cal 20 5 - 40 mg/dL   LDL Calculated 962 (H) 0 - 99 mg/dL   Chol/HDL Ratio 3.7 0.0 - 4.4 ratio    Comment:                                   T. Chol/HDL Ratio                                             Men  Women                               1/2 Avg.Risk  3.4    3.3                                   Avg.Risk  5.0  4.4                                2X Avg.Risk  9.6    7.1                                3X Avg.Risk 23.4   11.0   Comprehensive metabolic panel     Status: None   Collection Time: 10/02/18  4:24 PM  Result Value Ref Range   Glucose 71 65 - 99 mg/dL   BUN 10 6 - 24 mg/dL   Creatinine, Ser 1.610.69 0.57 - 1.00 mg/dL   GFR calc non Af Amer 108 >59 mL/min/1.73   GFR calc Af Amer 124 >59 mL/min/1.73   BUN/Creatinine Ratio 14 9 - 23   Sodium 139 134 - 144 mmol/L   Potassium 3.9 3.5 - 5.2 mmol/L   Chloride 100 96 - 106 mmol/L   CO2 23 20 - 29 mmol/L   Calcium 9.5 8.7 - 10.2 mg/dL   Total Protein 6.5 6.0 - 8.5 g/dL   Albumin 4.3 3.8 - 4.8 g/dL    Comment:               **Please note reference interval change**   Globulin, Total 2.2 1.5 - 4.5 g/dL   Albumin/Globulin Ratio 2.0 1.2 - 2.2   Bilirubin Total 0.3 0.0 - 1.2 mg/dL   Alkaline Phosphatase 79 39 - 117 IU/L   AST 16 0 - 40 IU/L   ALT 14 0 - 32 IU/L  TSH + free T4     Status: None   Collection Time: 10/02/18  4:24 PM  Result Value Ref Range   TSH 3.130 0.450 - 4.500 uIU/mL   Free T4 1.16 0.82 - 1.77 ng/dL    Assessment/Plan:  TOBACCO DEPENDENCE Failed nicotine patch and gum in the past. - Chantix starter pack and information on smoking cessation support  given.  Hyperlipidemia Patient has elevated LDL. 3448yr ASCVD risk score calculated at <5% so no statin is indicated at this time. However, patient does have risk factor for CAD given that she is a smoker. Will call patient and inform her of risk/benefit and give her the choice between diet and exercise vs low intensity statin. - If patient is agreeable will start low intensity pravastatin.   PATIENT EDUCATION PROVIDED: See AVS    Diagnosis and plan along with any newly prescribed medication(s) were discussed in detail with this patient today. The patient verbalized understanding and agreed with the plan. Patient advised if symptoms worsen return to clinic or ER.    Orders Placed This Encounter  Procedures  . Flu Vaccine QUAD 36+ mos IM  . Lipid Panel  . Comprehensive metabolic panel    Order Specific Question:   Has the patient fasted?    Answer:   No  . TSH + free T4    Meds ordered this encounter  Medications  . varenicline (CHANTIX STARTING MONTH PAK) 0.5 MG X 11 & 1 MG X 42 tablet    Sig: Take one 0.5 mg tablet by mouth once daily for 3 days, then increase to one 0.5 mg tablet twice daily for 4 days, then increase to one 1 mg tablet twice daily.    Dispense:  53 tablet    Refill:  0  . albuterol (PROVENTIL HFA;VENTOLIN HFA) 108 (90 Base)  MCG/ACT inhaler    Sig: Inhale 2 puffs into the lungs every 4 (four) hours as needed for wheezing.    Dispense:  1 Inhaler    Refill:  1     Tim Karen Chafe, DO 10/02/2018, 3:56 PM PGY-2 [facility]

## 2018-10-03 LAB — LIPID PANEL
Chol/HDL Ratio: 3.7 ratio (ref 0.0–4.4)
Cholesterol, Total: 183 mg/dL (ref 100–199)
HDL: 49 mg/dL (ref >39)
LDL CALC: 114 mg/dL — AB (ref 0–99)
Triglycerides: 102 mg/dL (ref 0–149)
VLDL Cholesterol Cal: 20 mg/dL (ref 5–40)

## 2018-10-03 LAB — COMPREHENSIVE METABOLIC PANEL
ALT: 14 IU/L (ref 0–32)
AST: 16 IU/L (ref 0–40)
Albumin/Globulin Ratio: 2 (ref 1.2–2.2)
Albumin: 4.3 g/dL (ref 3.8–4.8)
Alkaline Phosphatase: 79 IU/L (ref 39–117)
BILIRUBIN TOTAL: 0.3 mg/dL (ref 0.0–1.2)
BUN/Creatinine Ratio: 14 (ref 9–23)
BUN: 10 mg/dL (ref 6–24)
CO2: 23 mmol/L (ref 20–29)
Calcium: 9.5 mg/dL (ref 8.7–10.2)
Chloride: 100 mmol/L (ref 96–106)
Creatinine, Ser: 0.69 mg/dL (ref 0.57–1.00)
GFR calc Af Amer: 124 mL/min/{1.73_m2} (ref >59)
GFR calc non Af Amer: 108 mL/min/{1.73_m2} (ref >59)
Globulin, Total: 2.2 g/dL (ref 1.5–4.5)
Glucose: 71 mg/dL (ref 65–99)
Potassium: 3.9 mmol/L (ref 3.5–5.2)
Sodium: 139 mmol/L (ref 134–144)
Total Protein: 6.5 g/dL (ref 6.0–8.5)

## 2018-10-03 LAB — TSH+FREE T4
FREE T4: 1.16 ng/dL (ref 0.82–1.77)
TSH: 3.13 u[IU]/mL (ref 0.450–4.500)

## 2018-10-04 DIAGNOSIS — E785 Hyperlipidemia, unspecified: Secondary | ICD-10-CM | POA: Insufficient documentation

## 2018-10-04 NOTE — Assessment & Plan Note (Signed)
Patient has elevated LDL. 69yr ASCVD risk score calculated at <5% so no statin is indicated at this time. However, patient does have risk factor for CAD given that she is a smoker. Will call patient and inform her of risk/benefit and give her the choice between diet and exercise vs low intensity statin. - If patient is agreeable will start low intensity pravastatin.

## 2018-10-04 NOTE — Telephone Encounter (Signed)
Per CoverMyMeds, "[facility] does not manage PA for this patient, please call number on back of member card."  [facility] Plan at [phone] and spoke to Cherish in the [facility] department.  Clinical info provided.  Rx approved from 10/04/18 -10/05/19. Authorization # [id number].  [facility] notified.  Ples Specter, RN [facility] RN)

## 2018-10-04 NOTE — Assessment & Plan Note (Signed)
Failed nicotine patch and gum in the past. - Chantix starter pack and information on smoking cessation support given.

## 2018-10-13 ENCOUNTER — Other Ambulatory Visit: Payer: Self-pay | Admitting: Family Medicine

## 2018-10-13 DIAGNOSIS — J440 Chronic obstructive pulmonary disease with acute lower respiratory infection: Secondary | ICD-10-CM

## 2018-10-27 ENCOUNTER — Encounter: Payer: Self-pay | Admitting: Family Medicine

## 2018-10-27 ENCOUNTER — Ambulatory Visit (INDEPENDENT_AMBULATORY_CARE_PROVIDER_SITE_OTHER): Payer: No Typology Code available for payment source | Admitting: Family Medicine

## 2018-10-27 ENCOUNTER — Ambulatory Visit
Admission: RE | Admit: 2018-10-27 | Discharge: 2018-10-27 | Disposition: A | Discharge status: Home / Self Care | Payer: No Typology Code available for payment source | Source: Ambulatory Visit | Attending: Family Medicine | Admitting: Family Medicine

## 2018-10-27 VITALS — BP 122/85 | HR 86 | Temp 98.5°F | Wt 217.2 lb

## 2018-10-27 DIAGNOSIS — J441 Chronic obstructive pulmonary disease with (acute) exacerbation: Secondary | ICD-10-CM

## 2018-10-27 MED ORDER — PREDNISONE 20 MG PO TABS: 40.0000 mg | ORAL_TABLET | Freq: Every day | ORAL | 0 refills | Status: AC

## 2018-10-27 MED ORDER — DOXYCYCLINE HYCLATE 100 MG PO TABS: 100.0000 mg | ORAL_TABLET | Freq: Two times a day (BID) | ORAL | 0 refills | Status: DC

## 2018-10-27 NOTE — Progress Notes (Signed)
  Patient Name: Monica Sandoval Date of Birth: November 21, 1975 Date of Visit: 10/27/18 PCP: Arlyce Harman, DO  Chief Complaint: cough and wheezing   Subjective: [name] is a pleasant 43 y.o. with medical history significant for asthma and tobacco abuse presenting with 1 day of cough and wheezing. She has been feeling a bit achy with congestion and sneezing for several days. This progressed to wheezing and chest congestion. She has mild dyspnea, improved with albuterol use. She has a worsening productive cough and chest congestion.  She denies fevers, vomiting, chest pain with exertion, lower extremity swelling, palpitations, recent travel,exposure to COVID19.   The patient was recently prescribed Chantix for smoking cessation. Took this for 2 weeks and developed oral ulcers which she attributes to this. She would like to discuss Wellbutrin with Dr. Karen Chafe.   ROS: negative except for as above  ROS  I have reviewed the patient's medical, surgical, family, and social history as appropriate.   Vitals:   10/27/18 0833  BP: 122/85  Pulse: 86  Temp: 98.5 F (36.9 C)  SpO2: 99%   Filed Weights   10/27/18 0833  Weight: 217 lb 3.2 oz (98.5 kg)   HEENT: Sclera anicteric. Dentition is poor. Appears well hydrated. Neck: Supple Cardiac: Regular rate and rhythm. Normal S1/S2. No murmurs, rubs, or gallops appreciated. Lungs: Wheezing on expiration bilaterally, no rhonchi or egophony. 97% when ambulating, HR up to low 100s (105).  Abdomen: Normoactive bowel sounds. No tenderness to deep or light palpation. No rebound or guarding.  Extremities: Warm, well perfused without edema.  Skin: Warm, dry Psych: Pleasant and appropriate    Shawney was seen today for flu like symptoms.  Diagnoses and all orders for this visit:  COPD exacerbation (HCC), no fevers suggestive of pneumonia, although lung exam limited by wheezing. No symptoms or risk factors for PE  (PERC = 0), no chest pressure or angina  symptoms. Discussed importance of smoking cessation.  -     DG Chest 2 View; Future -     doxycycline (VIBRA-TABS) 100 MG tablet; Take 1 tablet (100 mg total) by mouth 2 (two) times daily. -     predniSONE (DELTASONE) 20 MG tablet; Take 2 tablets (40 mg total) by mouth daily with breakfast for 5 days. - Reviewed strict return precautions and reasons to return to care. Works in [employer], off work until 11/01/18.  Terisa Starr, MD  Family Medicine Teaching Service

## 2018-10-27 NOTE — Patient Instructions (Signed)
   It was wonderful to see you today.  Thank you for choosing [facility].   Please call [phone] with any questions about today's appointment.  Please be sure to schedule follow up at the front  desk before you leave today.   Terisa Starr, MD  Family Medicine   Go to get your x-ray today  Please pick up your steroid and antibiotic   If you develop worsening shortness of breath, chest pains, or inability to tolerate fluids please return to care immediately.

## 2018-12-21 ENCOUNTER — Telehealth (INDEPENDENT_AMBULATORY_CARE_PROVIDER_SITE_OTHER): Payer: No Typology Code available for payment source | Admitting: Family Medicine

## 2018-12-21 ENCOUNTER — Other Ambulatory Visit: Payer: Self-pay

## 2018-12-21 DIAGNOSIS — R05 Cough: Secondary | ICD-10-CM | POA: Diagnosis not present

## 2018-12-21 DIAGNOSIS — R059 Cough, unspecified: Secondary | ICD-10-CM

## 2018-12-21 NOTE — Progress Notes (Signed)
[facility] Telemedicine Visit  Patient consented to have virtual visit. Method of visit: Telephone  Encounter participants: Patient: Monica Sandoval - located at home Provider: Arlyce Harman - located at [facility] Others (if applicable): None  Chief Complaint: Cough with chest pain  HPI: Patient has had ongoing cough and chest pain for the past 2 months. She states she has quit smoking for over 1 month and seen no improvement in her symptoms as she thought that smoking was a major contributor to how she was feeling. Her cough is non-productive and she has remained afebrile. She is still taking her GERD medication and states this pain "feels different".   After discussing this issue with her we both decided it would be best to have her come to clinic and be evaluated for further work up. I will see her tomorrow at 1:50pm  ROS: per HPI  Pertinent PMHx: Ex-Smoker, Depression, HLD  Exam:  Respiratory: NWOB, speaks in full sentences  Assessment/Plan:  Cough with Chest Pain Chronic problem with no improvement after quitting smoking for over 1 month. Does not sound infectious as she has had no fever and cough is non-productive however I am concerned with the length of her symptoms with no improvement. Could be CAP vs PE - See in clinic tomorrow, obtain BMP, CBC w/Diff, potentially get CXR, EKG, and d-dimer.   Time spent during visit with patient: >10 minutes

## 2018-12-22 ENCOUNTER — Ambulatory Visit (INDEPENDENT_AMBULATORY_CARE_PROVIDER_SITE_OTHER): Payer: No Typology Code available for payment source | Admitting: Family Medicine

## 2018-12-22 ENCOUNTER — Other Ambulatory Visit: Payer: Self-pay

## 2018-12-22 ENCOUNTER — Encounter: Payer: Self-pay | Admitting: Family Medicine

## 2018-12-22 ENCOUNTER — Ambulatory Visit
Admission: RE | Admit: 2018-12-22 | Discharge: 2018-12-22 | Disposition: A | Discharge status: Home / Self Care | Payer: No Typology Code available for payment source | Source: Ambulatory Visit | Attending: Family Medicine | Admitting: Family Medicine

## 2018-12-22 ENCOUNTER — Ambulatory Visit (HOSPITAL_COMMUNITY)
Admission: RE | Admit: 2018-12-22 | Discharge: 2018-12-22 | Disposition: A | Discharge status: Home / Self Care | Payer: No Typology Code available for payment source | Source: Ambulatory Visit | Attending: Family Medicine | Admitting: Family Medicine

## 2018-12-22 VITALS — BP 112/84 | HR 76 | Wt 223.8 lb

## 2018-12-22 DIAGNOSIS — R0789 Other chest pain: Secondary | ICD-10-CM | POA: Insufficient documentation

## 2018-12-22 DIAGNOSIS — E785 Hyperlipidemia, unspecified: Secondary | ICD-10-CM

## 2018-12-22 DIAGNOSIS — R0602 Shortness of breath: Secondary | ICD-10-CM

## 2018-12-22 MED ORDER — MELOXICAM 15 MG PO TABS: 15.0000 mg | ORAL_TABLET | Freq: Every day | ORAL | 0 refills | Status: AC

## 2018-12-22 MED ORDER — ROSUVASTATIN CALCIUM 10 MG PO TABS: 10.0000 mg | ORAL_TABLET | Freq: Every day | ORAL | 3 refills | Status: DC

## 2018-12-22 NOTE — Patient Instructions (Signed)
It was great to see you today! Thank you for letting me participate in your care!  Today, we discussed your chest pain and continued shortness of breath. I will be in contact with you about your lab results and the result of your chest x-ray.  We started two new medications today. Meloxicam is for your chest pain which I think is due to costochondritis. I have also started you on Rosuvastatin which will help lower your cholesterol.  Be well, Monica Schick, DO PGY-2, [facility]

## 2018-12-22 NOTE — Progress Notes (Addendum)
Subjective: Chief Complaint  Patient presents with  . Chest Pain  . Cough     HPI: Monica Sandoval is a 43 y.o. presenting to clinic today to discuss the following:  Dyspnea with chest tightness Acute and worsening. Patient states she quit smoking over a month ago but her shortness of breath has gotten worse. No known sick contacts, COVID contacts, no fever, no productive cough. She does have a history of GERD and states her chest pain/tightness is "all over her chest". It is tender when she touches it and it starts on her right side. She does feel like she has "fluid" in her sinuses and when she coughs she can feel something in the back of her throat but nothing ever comes up. Of note, she also has a hiatal hernia.  No nausea, vomiting, abdominal pain, urinary burning or urgency.     ROS noted in HPI.   Past Medical, Surgical, Social, and Family History Reviewed & Updated per EMR.   Pertinent Historical Findings include:   Social History   Tobacco Use  Smoking Status Current Every Day Smoker  . Packs/day: 0.50  . Years: 8.00  . Pack years: 4.00  . Types: Cigarettes  Smokeless Tobacco Never Used  Tobacco Comment   Sts she quit 09/12/16    Objective: BP 112/84   Pulse 76   Wt 223 lb 12.8 oz (101.5 kg)   SpO2 99%   BMI 37.24 kg/m  Vitals and nursing notes reviewed  Physical Exam Gen: Alert and Oriented x 3, NAD HEENT: Normocephalic, atraumatic, PERRLA, EOMI, TM visible with good light reflex, non-swollen, non-erythematous turbinates, non-erythematous pharyngeal mucosa, no exudates Neck: no LAD, no JVD CV: RRR, no murmurs, normal S1, S2 split Resp: CTAB, no wheezing, rales, or rhonchi, comfortable work of breathing Ext: no clubbing, cyanosis, or edema Skin: warm, dry, intact, no rashes  LABS CBC    Component Value Date/Time   WBC 6.6 12/22/2018 1454   WBC 9.0 02/28/2017 1649   RBC 4.76 12/22/2018 1454   RBC 4.62 02/28/2017 1649   HGB 14.2 12/22/2018  1454   HCT 43.1 12/22/2018 1454   PLT 335 12/22/2018 1454   MCV 91 12/22/2018 1454   MCH 29.8 12/22/2018 1454   MCH 31.4 02/28/2017 1649   MCHC 32.9 12/22/2018 1454   MCHC 35.4 02/28/2017 1649   RDW 13.1 12/22/2018 1454   LYMPHSABS 3.4 05/23/2014 0011   MONOABS 0.8 05/23/2014 0011   EOSABS 0.3 05/23/2014 0011   BASOSABS 0.0 05/23/2014 0011   Iron/TIBC/Ferritin/ %Sat    Component Value Date/Time   IRON 83 12/22/2018 1454   TIBC 335 12/22/2018 1454   FERRITIN 79 12/22/2018 1454   IRONPCTSAT 25 12/22/2018 1454   BNP: 13.2 CXR: No active cardiopulmonary disease EKG: Normal Sinus Rhythm  Assessment/Plan:  Hyperlipidemia Patient still has elevated LDL and although ASCVD risk score is low her total cholesterol is going up and given she recently was a smoker patient wants to start a low dose statin. - Start Rosuvastatin 10mg  daily The 10-year ASCVD risk score Denman George DC Jr., et al., 2013) is: 2.1%   Values used to calculate the score:     Age: 79 years     Sex: Female     Is Non-Hispanic African American: No     Diabetic: No     Tobacco smoker: Yes     Systolic Blood Pressure: 112 mmHg     Is BP treated: No  HDL Cholesterol: 49 mg/dL     Total Cholesterol: 183 mg/dL   Chest pain Etiology included CAD vs MI vs PE vs CHF vs Costochondritis vs COPD vs CAP vs Anemia. CAP unlikely as WBC was normal, no fever, and no productive cough. CAD given age although she is a smoker so she does have a risk factor. EKG was normal so less likely. Iron panel and Hgb normal so not anemia related. MI not likely as EKG was normal. CHF not likely as BNP was normal as well as chest x-ray. PE not likely as she had all normal vitals  Most likely cause of chest pain is costochondritis vs anxiety component with possible COPD contributing via referred pain from cough. - Return for PFTs - Meloxicam 15mg  for 14 days -    PATIENT EDUCATION PROVIDED: See AVS    Diagnosis and plan along with any newly  prescribed medication(s) were discussed in detail with this patient today. The patient verbalized understanding and agreed with the plan. Patient advised if symptoms worsen return to clinic or ER.    Orders Placed This Encounter  Procedures  . DG Chest 2 View    Standing Status:   Future    Number of Occurrences:   1    Standing Expiration Date:   01/22/2019    Order Specific Question:   Reason for Exam (SYMPTOM  OR DIAGNOSIS REQUIRED)    Answer:   dyspnea    Order Specific Question:   Is patient pregnant?    Answer:   No    Order Specific Question:   Preferred imaging location?    Answer:   [facility]    Order Specific Question:   Radiology Contrast Protocol - do NOT remove file path    Answer:   [email or web address]  . Brain natriuretic peptide  . CBC  . Iron, TIBC and Ferritin Panel  . EKG 12-Lead    Meds ordered this encounter  Medications  . meloxicam (MOBIC) 15 MG tablet    Sig: Take 1 tablet (15 mg total) by mouth daily for 14 days.    Dispense:  14 tablet    Refill:  0  . rosuvastatin (CRESTOR) 10 MG tablet    Sig: Take 1 tablet (10 mg total) by mouth daily.    Dispense:  90 tablet    Refill:  3     Jules Schickim Sina Lucchesi, DO 12/22/2018, 2:11 PM PGY-2 [facility]

## 2018-12-23 LAB — IRON,TIBC AND FERRITIN PANEL
Ferritin: 79 ng/mL (ref 15–150)
Iron Saturation: 25 % (ref 15–55)
Iron: 83 ug/dL (ref 27–159)
Total Iron Binding Capacity: 335 ug/dL (ref 250–450)
UIBC: 252 ug/dL (ref 131–425)

## 2018-12-23 LAB — CBC
Hematocrit: 43.1 % (ref 34.0–46.6)
Hemoglobin: 14.2 g/dL (ref 11.1–15.9)
MCH: 29.8 pg (ref 26.6–33.0)
MCHC: 32.9 g/dL (ref 31.5–35.7)
MCV: 91 fL (ref 79–97)
Platelets: 335 10*3/uL (ref 150–450)
RBC: 4.76 x10E6/uL (ref 3.77–5.28)
RDW: 13.1 % (ref 11.7–15.4)
WBC: 6.6 10*3/uL (ref 3.4–10.8)

## 2018-12-23 LAB — BRAIN NATRIURETIC PEPTIDE: BNP: 13.2 pg/mL (ref 0.0–100.0)

## 2018-12-28 NOTE — Assessment & Plan Note (Signed)
Etiology included CAD vs MI vs PE vs CHF vs Costochondritis vs COPD vs CAP vs Anemia. CAP unlikely as WBC was normal, no fever, and no productive cough. CAD given age although she is a smoker so she does have a risk factor. EKG was normal so less likely. Iron panel and Hgb normal so not anemia related. MI not likely as EKG was normal. CHF not likely as BNP was normal as well as chest x-ray. PE not likely as she had all normal vitals  Most likely cause of chest pain is costochondritis vs anxiety component with possible COPD contributing via referred pain from cough. - Return for PFTs - Meloxicam 15mg  for 14 days -

## 2018-12-28 NOTE — Assessment & Plan Note (Signed)
Patient still has elevated LDL and although ASCVD risk score is low her total cholesterol is going up and given she recently was a smoker patient wants to start a low dose statin. - Start Rosuvastatin 10mg  daily The 10-year ASCVD risk score Denman George DC Jr., et al., 2013) is: 2.1%   Values used to calculate the score:     Age: 43 years     Sex: Female     Is Non-Hispanic African American: No     Diabetic: No     Tobacco smoker: Yes     Systolic Blood Pressure: 112 mmHg     Is BP treated: No     HDL Cholesterol: 49 mg/dL     Total Cholesterol: 183 mg/dL

## 2019-01-13 ENCOUNTER — Other Ambulatory Visit: Payer: Self-pay | Admitting: Family Medicine

## 2019-01-13 DIAGNOSIS — J440 Chronic obstructive pulmonary disease with acute lower respiratory infection: Secondary | ICD-10-CM

## 2019-07-05 ENCOUNTER — Ambulatory Visit (HOSPITAL_COMMUNITY)
Admission: EM | Admit: 2019-07-05 | Discharge: 2019-07-05 | Discharge status: Against Medical Advice | Payer: No Typology Code available for payment source

## 2019-07-05 ENCOUNTER — Other Ambulatory Visit: Payer: Self-pay

## 2019-07-05 NOTE — ED Notes (Signed)
Pt told front desk that she decided to make an appointment with her doctor instead of waiting here.  Per Jenny Reichmann, she stated she wasn't dying so she is going to wait to see her doctor.

## 2019-07-09 ENCOUNTER — Emergency Department (HOSPITAL_COMMUNITY): Payer: No Typology Code available for payment source

## 2019-07-09 ENCOUNTER — Emergency Department (HOSPITAL_BASED_OUTPATIENT_CLINIC_OR_DEPARTMENT_OTHER): Payer: No Typology Code available for payment source

## 2019-07-09 ENCOUNTER — Other Ambulatory Visit: Payer: Self-pay

## 2019-07-09 ENCOUNTER — Ambulatory Visit (INDEPENDENT_AMBULATORY_CARE_PROVIDER_SITE_OTHER): Payer: No Typology Code available for payment source | Admitting: *Deleted

## 2019-07-09 ENCOUNTER — Ambulatory Visit (INDEPENDENT_AMBULATORY_CARE_PROVIDER_SITE_OTHER): Payer: No Typology Code available for payment source | Admitting: Family Medicine

## 2019-07-09 ENCOUNTER — Emergency Department (HOSPITAL_COMMUNITY)
Admission: EM | Admit: 2019-07-09 | Discharge: 2019-07-09 | Disposition: A | Discharge status: Home / Self Care | Payer: No Typology Code available for payment source | Attending: Emergency Medicine | Admitting: Emergency Medicine

## 2019-07-09 ENCOUNTER — Ambulatory Visit (HOSPITAL_COMMUNITY)
Admission: RE | Admit: 2019-07-09 | Discharge: 2019-07-09 | Disposition: A | Discharge status: Home / Self Care | Payer: No Typology Code available for payment source | Source: Ambulatory Visit

## 2019-07-09 ENCOUNTER — Encounter (HOSPITAL_COMMUNITY): Payer: Self-pay | Admitting: Emergency Medicine

## 2019-07-09 VITALS — BP 110/64 | HR 79 | Ht 65.0 in | Wt 216.2 lb

## 2019-07-09 DIAGNOSIS — R52 Pain, unspecified: Secondary | ICD-10-CM

## 2019-07-09 DIAGNOSIS — F17210 Nicotine dependence, cigarettes, uncomplicated: Secondary | ICD-10-CM | POA: Diagnosis not present

## 2019-07-09 DIAGNOSIS — J45909 Unspecified asthma, uncomplicated: Secondary | ICD-10-CM | POA: Insufficient documentation

## 2019-07-09 DIAGNOSIS — R079 Chest pain, unspecified: Secondary | ICD-10-CM | POA: Diagnosis not present

## 2019-07-09 DIAGNOSIS — I82442 Acute embolism and thrombosis of left tibial vein: Secondary | ICD-10-CM | POA: Diagnosis not present

## 2019-07-09 DIAGNOSIS — Z23 Encounter for immunization: Secondary | ICD-10-CM

## 2019-07-09 DIAGNOSIS — Z79899 Other long term (current) drug therapy: Secondary | ICD-10-CM | POA: Diagnosis not present

## 2019-07-09 DIAGNOSIS — M79662 Pain in left lower leg: Secondary | ICD-10-CM | POA: Diagnosis present

## 2019-07-09 LAB — TROPONIN I (HIGH SENSITIVITY)
Troponin I (High Sensitivity): <2 ng/L (ref <18)
Troponin I (High Sensitivity): 3 ng/L (ref <18)

## 2019-07-09 LAB — CBC
HCT: 43.2 % (ref 36.0–46.0)
Hemoglobin: 14.6 g/dL (ref 12.0–15.0)
MCH: 30.5 pg (ref 26.0–34.0)
MCHC: 33.8 g/dL (ref 30.0–36.0)
MCV: 90.2 fL (ref 80.0–100.0)
Platelets: 273 10*3/uL (ref 150–400)
RBC: 4.79 MIL/uL (ref 3.87–5.11)
RDW: 13.5 % (ref 11.5–15.5)
WBC: 10.1 10*3/uL (ref 4.0–10.5)
nRBC: 0 % (ref 0.0–0.2)

## 2019-07-09 LAB — BASIC METABOLIC PANEL
Anion gap: 9 (ref 5–15)
BUN: 7 mg/dL (ref 6–20)
CO2: 26 mmol/L (ref 22–32)
Calcium: 9.3 mg/dL (ref 8.9–10.3)
Chloride: 106 mmol/L (ref 98–111)
Creatinine, Ser: 0.81 mg/dL (ref 0.44–1.00)
GFR calc Af Amer: >60 mL/min (ref >60)
GFR calc non Af Amer: >60 mL/min (ref >60)
Glucose, Bld: 100 mg/dL — ABNORMAL HIGH (ref 70–99)
Potassium: 4.3 mmol/L (ref 3.5–5.1)
Sodium: 141 mmol/L (ref 135–145)

## 2019-07-09 LAB — I-STAT BETA HCG BLOOD, ED (MC, WL, AP ONLY): I-stat hCG, quantitative: <5 m[IU]/mL (ref <5)

## 2019-07-09 MED ORDER — APIXABAN 5 MG PO TABS: ORAL_TABLET | ORAL | 0 refills | Status: DC

## 2019-07-09 MED ORDER — RIVAROXABAN 15 MG PO TABS
15.0000 mg | ORAL_TABLET | Freq: Once | ORAL | Status: DC
  Filled 2019-07-09: qty 1

## 2019-07-09 MED ORDER — IOHEXOL 350 MG/ML SOLN
100.0000 mL | Freq: Once | INTRAVENOUS | Status: AC | PRN
  Administered 2019-07-09: 100 mL via INTRAVENOUS

## 2019-07-09 MED ORDER — ACETAMINOPHEN 325 MG PO TABS
650.0000 mg | ORAL_TABLET | Freq: Once | ORAL | Status: AC
  Administered 2019-07-09: 20:00:00 650 mg via ORAL
  Filled 2019-07-09: qty 2

## 2019-07-09 MED ORDER — HYDROCODONE-ACETAMINOPHEN 5-325 MG PO TABS: 1.0000 | ORAL_TABLET | Freq: Four times a day (QID) | ORAL | 0 refills | Status: DC | PRN

## 2019-07-09 MED ORDER — ASPIRIN 325 MG PO TABS
325.0000 mg | ORAL_TABLET | Freq: Once | ORAL | Status: AC
  Administered 2019-07-09: 17:00:00 325 mg via ORAL

## 2019-07-09 MED ORDER — SODIUM CHLORIDE 0.9% FLUSH: 3.0000 mL | Freq: Once | INTRAVENOUS | Status: DC

## 2019-07-09 MED ORDER — APIXABAN 5 MG PO TABS
10.0000 mg | ORAL_TABLET | Freq: Once | ORAL | Status: AC
  Administered 2019-07-09: 22:00:00 10 mg via ORAL
  Filled 2019-07-09: qty 2

## 2019-07-09 NOTE — Progress Notes (Signed)
VASCULAR LAB PRELIMINARY  PRELIMINARY  PRELIMINARY  PRELIMINARY  Left lower extremity venous duplex completed.    Preliminary report:  See CV proc for preliminary results.   Gave PA and RN results.  Dymir Neeson, RVT 07/09/2019, 6:46 PM

## 2019-07-09 NOTE — ED Provider Notes (Signed)
[place] EMERGENCY DEPARTMENT Provider Note   CSN: [id number] Arrival date & time: 07/09/19  1716     History   Chief Complaint Chief Complaint  Patient presents with   Chest Pain   Leg Pain    HPI Monica Sandoval is a 43 y.o. female with history of tobacco abuse, asthma, hyperlipidemia, and depression who presents to the emergency department at the request of her primary care provider for evaluation of chest pain intermittently for the past 6 months and left lower extremity pain over the past 1 month.  Patient states the pain occurs intermittently to her primarily her right chest, it is sharp in nature, lasts anywhere from 5 to 15 minutes, no specific triggers or alleviating or aggravating factors.  Not specific to exertion, deep breathing, or eating.  No specific associated symptoms.  Denies lightheadedness, dizziness, nausea, vomiting, diaphoresis, or dyspnea.  She also reports left lateral lower leg pain intermittently for the past 1 month, occurs more frequently with ambulation but can occur at rest.  No other alleviating or aggravating factors.  Denies associated swelling, redness, numbness, tingling, or weakness. Denies hemoptysis, recent surgery/trauma, recent long travel, personal hx of cancer, or hx of DVT/PE. She does have an IUD, no OCP use.        HPI  Past Medical History:  Diagnosis Date   Asthma     Patient Active Problem List   Diagnosis Date Noted   Hyperlipidemia 10/04/2018   Primary osteoarthritis of left knee 03/22/2018   Left knee injury, initial encounter 03/15/2018   Chronic left shoulder pain 09/21/2017   Acute pain of left knee 09/21/2017   Current mild episode of major depressive disorder without prior episode ([place]) 05/02/2017   STD exposure 05/02/2017   Pelvic cramping 10/28/2016   Hearing loss 10/17/2009   Chest pain 09/06/2008   Cough 06/06/2008   HEMORRHOIDS, INTERNAL, WITH BLEEDING 01/12/2008   Shortness  of breath 07/10/2007   TOBACCO DEPENDENCE 10/20/2006   TENSION HEADACHE 10/20/2006   HERNIA, HIATAL, NONCONGENITAL 10/20/2006    Past Surgical History:  Procedure Laterality Date   CHOLECYSTECTOMY  2008     OB History   No obstetric history on file.      Home Medications    Prior to Admission medications   Medication Sig Start Date End Date Taking? Authorizing Provider  doxycycline (VIBRA-TABS) 100 MG tablet Take 1 tablet (100 mg total) by mouth 2 (two) times daily. 10/27/18   Martyn Malay, MD  esomeprazole (NEXIUM) 40 MG capsule Take 40 mg by mouth daily at 12 noon.    [provider]  ibuprofen (ADVIL,MOTRIN) 200 MG tablet Take 800 mg by mouth every 6 (six) hours as needed for headache, mild pain or moderate pain.     [provider]  naproxen (NAPROSYN) 500 MG tablet Take 1 tablet (500 mg total) by mouth 2 (two) times daily as needed. 03/15/18   Loa Socks, DO  rosuvastatin (CRESTOR) 10 MG tablet Take 1 tablet (10 mg total) by mouth daily. 12/22/18   Nuala Alpha, DO  varenicline (CHANTIX STARTING MONTH PAK) 0.5 MG X 11 & 1 MG X 42 tablet Take one 0.5 mg tablet by mouth once daily for 3 days, then increase to one 0.5 mg tablet twice daily for 4 days, then increase to one 1 mg tablet twice daily. 10/02/18   Lockamy, Timothy, DO  VENTOLIN HFA 108 (90 Base) MCG/ACT inhaler INHALE 2 PUFFS BY MOUTH EVERY 4 HOURS  AS NEEDED FOR WHEEZE 01/16/19   Arlyce Harman, DO    Family History Family History  Problem Relation Age of Onset   Asthma Mother    Rheum arthritis Mother    Arthritis Mother    Diabetes Father    Heart disease Father    Early death Father    Diabetes Paternal Grandmother    Heart disease Paternal Grandmother    Cancer Paternal Grandmother     Social History Social History   Tobacco Use   Smoking status: Current Every Day Smoker    Packs/day: 0.50    Years: 8.00    Pack years: 4.00    Types: Cigarettes   Smokeless  tobacco: Never Used   Tobacco comment: Sts she quit 09/12/16  Substance Use Topics   Alcohol use: No   Drug use: No    Allergies   Patient has no known allergies.   Review of Systems Review of Systems  Constitutional: Negative for chills and fever.  HENT: Negative for congestion, ear pain and sore throat.   Respiratory: Negative for shortness of breath.   Cardiovascular: Positive for chest pain. Negative for leg swelling.  Gastrointestinal: Negative for nausea and vomiting.  Musculoskeletal: Positive for myalgias.  Neurological: Negative for syncope, weakness and numbness.  All other systems reviewed and are negative.   Physical Exam Updated Vital Signs BP 127/89 (BP Location: Left Arm)    Pulse 76    Temp 98.2 F (36.8 C) (Oral)    Resp 17    SpO2 100%   Physical Exam Vitals signs and nursing note reviewed.  Constitutional:      General: She is not in acute distress.    Appearance: She is well-developed. She is not ill-appearing or toxic-appearing.  HENT:     Head: Normocephalic and atraumatic.  Eyes:     General:        Right eye: No discharge.        Left eye: No discharge.     Conjunctiva/sclera: Conjunctivae normal.  Neck:     Musculoskeletal: Neck supple.  Cardiovascular:     Rate and Rhythm: Normal rate and regular rhythm.     Pulses:          Dorsalis pedis pulses are 2+ on the right side and 2+ on the left side.       Posterior tibial pulses are 2+ on the right side and 2+ on the left side.  Pulmonary:     Effort: Pulmonary effort is normal. No respiratory distress.     Breath sounds: Wheezing (Mild expiratory wheeze throughout which patient states is baseline for her.) present. No rhonchi or rales.  Abdominal:     General: There is no distension.     Palpations: Abdomen is soft.     Tenderness: There is no abdominal tenderness.  Musculoskeletal:     Comments: Lower extremities: Patient has trace to 1+ pitting edema to the left lower leg.  There is no  overlying erythema, warmth, pallor, or ecchymosis.  Compartments are soft.  Patient has intact AROM to bilateral hips, knees, ankles, and all digits.  No significant areas of tenderness to palpation.   Skin:    General: Skin is warm and dry.     Capillary Refill: Capillary refill takes less than 2 seconds.     Findings: No rash.  Neurological:     Mental Status: She is alert.     Comments: Alert. Clear speech. Sensation grossly intact to bilateral lower  extremities. 5/5 strength with plantar/dorsiflexion bilaterally. Patient ambulatory.  Psychiatric:        Mood and Affect: Mood normal.        Behavior: Behavior normal.    ED Treatments / Results  Labs (all labs ordered are listed, but only abnormal results are displayed) Labs Reviewed  BASIC METABOLIC PANEL - Abnormal; Notable for the following components:      Result Value   Glucose, Bld 100 (*)    All other components within normal limits  CBC  I-STAT BETA HCG BLOOD, ED ([facility], [facility], [facility] ONLY)  TROPONIN I (HIGH SENSITIVITY)  TROPONIN I (HIGH SENSITIVITY)    EKG None    Date: 07/09/2019  Rate: 67 bpm  Rhythm: normal sinus rhythm  QRS Axis: normal  Intervals: normal  ST/T Wave abnormalities: normal  Conduction Disutrbances: none  Narrative Interpretation: NO STEMI, NSR.  No significant change from prior tracings.  Radiology Dg Chest 2 View  Result Date: 07/09/2019 CLINICAL DATA:  Chest pain. EXAM: CHEST - 2 VIEW COMPARISON:  10/27/2018 FINDINGS: The heart size and mediastinal contours are within normal limits. Both lungs are clear. The visualized skeletal structures are unremarkable. IMPRESSION: No active cardiopulmonary disease. Electronically Signed   By: Signa Kellaylor  Stroud M.D.   On: 07/09/2019 18:12   Ct Angio Chest Pe W/cm &/or Wo Cm  Result Date: 07/09/2019 CLINICAL DATA:  Left leg pain for 1 month, intermittent chest pain since May EXAM: CT ANGIOGRAPHY CHEST WITH CONTRAST TECHNIQUE: Multidetector CT imaging of the chest  was performed using the standard protocol during bolus administration of intravenous contrast. Multiplanar CT image reconstructions and MIPs were obtained to evaluate the vascular anatomy. CONTRAST:  100mL OMNIPAQUE IOHEXOL 350 MG/ML SOLN COMPARISON:  Same-day chest radiograph FINDINGS: Cardiovascular: Satisfactory opacification the pulmonary arteries to the segmental level. No pulmonary artery filling defects are identified. Central pulmonary arteries are normal caliber. No elevation of the RV/LV ratio (0.8). Normal heart size. No pericardial effusion. Normal caliber thoracic aorta. Normal 3 vessel branching of the aortic arch. Major venous structures are unremarkable. Mediastinum/Nodes: Moderate hiatal hernia. Thoracic esophagus is otherwise unremarkable. No acute tracheal abnormality. Thyroid gland and thoracic inlet are unremarkable. No mediastinal, hilar or axillary adenopathy. Lungs/Pleura: Mild airways thickening. Some mosaic attenuation in the bases may be related to imaging during exhalation versus air trapping/small airways disease. No consolidation, features of edema, pneumothorax, or effusion. No suspicious pulmonary nodules or masses. Mild apical pleuroparenchymal scarring is noted. Upper Abdomen: No acute abnormalities present in the visualized portions of the upper abdomen. Patient is post cholecystectomy. Scattered colonic diverticula without focal pericolonic inflammation to suggest diverticulitis. Musculoskeletal: No acute osseous abnormality or suspicious osseous lesion. Review of the MIP images confirms the above findings. IMPRESSION: 1. No evidence of pulmonary embolism. No acute intrathoracic process. 2. Mild airways thickening and mosaic attenuation in the bases may be related to imaging during exhalation versus air trapping/small airways disease. Such findings could be seen with chronic bronchitis or as sequela of asthma/reactive airways disease. 3. Moderate hiatal hernia. Electronically  Signed   By: Kreg ShropshirePrice  DeHay M.D.   On: 07/09/2019 21:45   Vas [place] Lower Extremity Venous (dvt) (only [facility])  Result Date: 07/09/2019  Lower Venous Study Indications: Pain, and Chest pain.  Limitations: Body habitus and depth of vessels, patient constantly taking deep breaths and holding it. Comparison Study: No prior study on file for comparison. Performing Technologist: Sherren Kernsandace Kanady RVS  Examination Guidelines: A complete evaluation includes B-mode imaging, spectral  Doppler, color Doppler, and power Doppler as needed of all accessible portions of each vessel. Bilateral testing is considered an integral part of a complete examination. Limited examinations for reoccurring indications may be performed as noted.  +-----+---------------+---------+-----------+----------+--------------+  RIGHT Compressibility Phasicity Spontaneity Properties Thrombus Aging  +-----+---------------+---------+-----------+----------+--------------+  CFV   Full            Yes       Yes                                    +-----+---------------+---------+-----------+----------+--------------+   +---------+---------------+---------+-----------+----------+--------------+  LEFT      Compressibility Phasicity Spontaneity Properties Thrombus Aging  +---------+---------------+---------+-----------+----------+--------------+  CFV       Full            Yes       Yes                                    +---------+---------------+---------+-----------+----------+--------------+  SFJ       Full                                                             +---------+---------------+---------+-----------+----------+--------------+  FV Prox   Full                                                             +---------+---------------+---------+-----------+----------+--------------+  FV Mid    Full                                                             +---------+---------------+---------+-----------+----------+--------------+  FV Distal Full                                                              +---------+---------------+---------+-----------+----------+--------------+  PFV       Full                                                             +---------+---------------+---------+-----------+----------+--------------+  POP       Full            Yes       Yes                                    +---------+---------------+---------+-----------+----------+--------------+  PTV  None                                                             +---------+---------------+---------+-----------+----------+--------------+  PERO      None                                                             +---------+---------------+---------+-----------+----------+--------------+     Summary: Right: No evidence of common femoral vein obstruction. Left: Findings consistent with age indeterminate deep vein thrombosis involving the left posterior tibial veins, and left peroneal veins.  *See table(s) above for measurements and observations.    Preliminary     Procedures Procedures (including critical care time)  Medications Ordered in ED Medications  sodium chloride flush (NS) 0.9 % injection 3 mL (has no administration in time range)     Initial Impression / Assessment and Plan / ED Course  I have reviewed the triage vital signs and the nursing notes.  Pertinent labs & imaging results that were available during my care of the patient were reviewed by me and considered in my medical decision making (see chart for details).  Patient presents to the emergency department for intermittent chest pain for the past 6 months and for intermittent left lower extremity pain over the past 1 month.  Patient is nontoxic-appearing, resting comfortably, vitals WNL with the exception of mildly elevated blood pressure, doubt HTN emergency.  -Left lower extremity discomfort: No erythema/warmth/fevers, not consistent with cellulitis.  No direct trauma to the area, no  point/focal bony tenderness, doubt fracture/dislocation.  Venous duplex per triage with findings consistent with age indeterminate deep vein thrombosis involving the left posterior tibial veins, and left peroneal veins--> given patient with pain over the past month which seems more lateral per report suspicion is for acute DVT.  Will start Eliquis in the emergency department.  Counseled on this medicine, pharmacy was also consulted to provide education.  -Chest pain: Intermittent for past 6 months.  Heart regular rate and rhythm.  Lungs with some mild expiratory wheezing, no reports of dyspnea, patient states this is baseline for her, counseled on tobacco cessation.  Labs without underlying anemia or electrolyte derangement.  EKG is nonischemic, troponins without significant elevation or change with delta, doubt ACS.  CT angios negative for pulmonary embolism.  Given intermittent pain for 6 months, no widened mediastinum on imaging, symmetric pulses, doubt dissection.  No findings of pneumothorax, fluid overload, or pneumonia on imaging.  Unclear definitive etiology.  Appears appropriate for discharge home with PCP follow-up and cardiology follow-up in this regard.   I discussed results, treatment plan, need for follow-up, and return precautions with the patient. Provided opportunity for questions, patient confirmed understanding and is in agreement with plan.    Final Clinical Impressions(s) / ED Diagnoses   Final diagnoses:  Deep vein thrombosis (DVT) of tibial vein of left lower extremity, unspecified chronicity (HCC)  Chest pain, unspecified type    ED Discharge Orders         Ordered    apixaban (ELIQUIS) 5 MG TABS tablet     07/09/19 2202  HYDROcodone-acetaminophen (NORCO/VICODIN) 5-325 MG tablet  Every 6 hours PRN     07/09/19 2204           Cherly Anderson, PA-C 07/09/19 2224    Geoffery Lyons, MD 07/09/19 2322

## 2019-07-09 NOTE — ED Notes (Signed)
Discharge instructions discussed with pt. Pt verbalized understanding. Pt stable and ambulatory. No signature pad available. Pharmacy discussed eliquis w/ pt.

## 2019-07-09 NOTE — Progress Notes (Signed)
Pt tolerated vaccine well. Deseree C Blount, CMA  

## 2019-07-09 NOTE — Assessment & Plan Note (Signed)
Chest pain with left calf pain.  Normal EKG, normal vital signs, normal saturation at rest.  Concern for possible DVT converted to PE.  Also cannot rule out cardiac causes.,  Although less likely given normal EKG.  ASA 325 was given today.  Recommend patient follow-up in ED for chest pain rule out and evaluation for pulmonary embolism.  Infectious cause seems unlikely given negative history.  Precepted with Dr. McDiarmid.

## 2019-07-09 NOTE — ED Triage Notes (Signed)
PT states her left leg has been hurting for a month. Denies any injury, no more swelling than normal or redness. Her MD is concerned for DVT. Pt also complains of CP that has been intermittent since May. No SOB.

## 2019-07-09 NOTE — Discharge Instructions (Addendum)
You were seen in the ER today for leg pain and chest pain.  Your ultrasound showed that you have a blood clot in your leg We are starting you on Eliquis, this is a blood thinner medicine, please see attached handout regarding further information with this.  Given that you are on a blood thinner you hit your head, noticed blood in your stool, blood in your urine, have a traumatic injury is important that you return to the emergency department immediately for evaluation. Do not take NSAIDs with Eliquis (ibuprofen, motrin, advil, aleve, goodie powder, mobic, naproxen etc.) as this further since your blood.   In addition to Eliquis we are sending you home with a few tablets of Norco to help with your pain. -Norco-this is a narcotic/controlled substance medication that has potential addicting qualities.  We recommend that you take 1-2 tablets every 6 hours as needed for severe pain.  Do not drive or operate heavy machinery when taking this medicine as it can be sedating. Do not drink alcohol or take other sedating medications when taking this medicine for safety reasons.  Keep this out of reach of small children.  Please be aware this medicine has Tylenol in it (325 mg/tab) do not exceed the maximum dose of Tylenol in a day per over the counter recommendations should you decide to supplement with Tylenol over the counter.    We have prescribed you new medication(s) today. Discuss the medications prescribed today with your pharmacist as they can have adverse effects and interactions with your other medicines including over the counter and prescribed medications. Seek medical evaluation if you start to experience new or abnormal symptoms after taking one of these medicines, seek care immediately if you start to experience difficulty breathing, feeling of your throat closing, facial swelling, or rash as these could be indications of a more serious allergic reaction  The rest of your work-up was overall  reassuring. Would like you to follow-up with your primary care provider within 3 days for reevaluation of your lower extremity as well as your chest pain.  We would also like you to follow-up with cardiology within 3 days.  Return to the ER for new or worsening symptoms including but not limited to worsening pain, numbness/tingling of the foot, discoloration of your leg, shortness of breath, worsening chest pain, passing out, or any other concerns.

## 2019-07-09 NOTE — Progress Notes (Signed)
    Subjective:  Monica Sandoval is a 43 y.o. female who presents to the [facility] today with a chief complaint of chest pain.   HPI:  Patient reports that she has had chest pain for 2 weeks.  She says similar to her chest pain that she had back in May.  Patient also reports left leg for 1 month.  Patient says that she has an occasional cough with smoking, denies any recent URI type illness or worsening cough, fevers.  Denies any hemoptysis.  Patient denies any history of blood clots.  Family history is significant for PE on her mother side.  She has a cardiac family history for MI in her father.  Patient says that she also has history of hiatal hernia.  She can have heartburn but this is improved with her Nexium.  Patient denies any recent heartburn type symptoms.  Patient denies any wheezing, shortness of breath.  EKG today is normal sinus rhythm.  Patient reports chest pain is 3 out of 10.  The chest pain is predominantly on her left side and radiates to her right.  The chest pain is worsening.  The leg pain is also not improving.  Patient will take ibuprofen as needed, which does help.  ROS: Per HPI   Objective:  Physical Exam: BP 110/64   Pulse 79   Ht 5\' 5"  (1.651 m)   Wt 216 lb 4 oz (98.1 kg)   SpO2 98%   BMI 35.99 kg/m   Gen: NAD, resting comfortably CV: RRR with no murmurs appreciated Pulm: NWOB, bronchovesicular breath sounds without overt wheezing throughout  GI: Soft, Nontender, Nondistended. MSK: Trace pitting edema in the calves, bilateral legs are nontender to palpation Skin: warm, dry Neuro: grossly normal, moves all extremities Psych: Normal affect and thought content  No results found for this or any previous visit (from the past 72 hour(s)).   Assessment/Plan:  Chest pain Chest pain with left calf pain.  Normal EKG, normal vital signs, normal saturation at rest.  Concern for possible DVT converted to PE.  Also cannot rule out cardiac causes.,  Although less likely given  normal EKG.  ASA 325 was given today.  Recommend patient follow-up in ED for chest pain rule out and evaluation for pulmonary embolism.  Infectious cause seems unlikely given negative history.  Precepted with Dr. McDiarmid.  Given stable vital signs, patient can self transport.  ED notified of patient going to head that way.  Lab Orders  No laboratory test(s) ordered today    Meds ordered this encounter  Medications  . aspirin tablet 325 mg      Marny Lowenstein, MD, MS FAMILY MEDICINE RESIDENT - PGY3 07/09/2019 5:12 PM

## 2019-07-09 NOTE — Patient Instructions (Signed)
It was a pleasure to see you today! Thank you for choosing [facility] for your primary care. Monica Sandoval was seen for chest pain.   We are sending you to the emergency department to rule out pulmonary embolism versus other cardiac causes.  Please go straight to the [facility] for further evaluation.  We are going to call ahead and let them know that you are coming.  We also gave you 325 mg aspirin today just for cardiac protection.  Best,  Marny Lowenstein, MD, MS FAMILY MEDICINE RESIDENT - PGY3 07/09/2019 5:04 PM

## 2019-07-11 ENCOUNTER — Telehealth: Payer: Self-pay

## 2019-07-11 ENCOUNTER — Other Ambulatory Visit: Payer: Self-pay | Admitting: Family Medicine

## 2019-07-11 MED ORDER — ONDANSETRON HCL 4 MG PO TABS: 4.0000 mg | ORAL_TABLET | Freq: Three times a day (TID) | ORAL | 0 refills | Status: DC | PRN

## 2019-07-11 NOTE — Progress Notes (Signed)
zofran sent in for nausea, patient thinks it is related to Xarelto that was just started due to DVT.

## 2019-07-11 NOTE — Telephone Encounter (Signed)
Patient calls nurse line stating she was recently started on Eliquis for recent DVT. Patient stated since starting the medication she has been nauseated. Patient is requesting something for nausea to be sent to her pharmacy. Please advise.

## 2019-07-13 ENCOUNTER — Other Ambulatory Visit: Payer: Self-pay

## 2019-07-13 ENCOUNTER — Ambulatory Visit (INDEPENDENT_AMBULATORY_CARE_PROVIDER_SITE_OTHER): Payer: No Typology Code available for payment source | Admitting: Family Medicine

## 2019-07-13 VITALS — BP 125/70 | HR 78 | Wt 213.2 lb

## 2019-07-13 DIAGNOSIS — F17200 Nicotine dependence, unspecified, uncomplicated: Secondary | ICD-10-CM | POA: Diagnosis not present

## 2019-07-13 DIAGNOSIS — R0789 Other chest pain: Secondary | ICD-10-CM

## 2019-07-13 DIAGNOSIS — I82442 Acute embolism and thrombosis of left tibial vein: Secondary | ICD-10-CM

## 2019-07-13 MED ORDER — NICOTINE 21 MG/24HR TD PT24: 21.0000 mg | MEDICATED_PATCH | Freq: Every day | TRANSDERMAL | 2 refills | Status: DC

## 2019-07-13 MED ORDER — APIXABAN 5 MG PO TABS: ORAL_TABLET | ORAL | 1 refills | Status: DC

## 2019-07-13 NOTE — Patient Instructions (Addendum)
Please continue your Eliquis 5mg  twice a day.   I am so happy you are motivated to quit smoking. Here is some information on the different forms of treatment:  Different medicines work in different ways:  - Nicotine replacement therapy eases withdrawal and reduces your body's craving for nicotine, the main drug found in cigarettes. There are different forms of nicotine replacement, including skin patches, lozenges, gum, nasal sprays, and "puffers" or inhalers. Many can be bought without a prescription, while others might require one.  - Bupropion is a prescription medicine that reduces your desire to smoke. This medicine is sold under the brand names Zyban and Wellbutrin. It is also available in a generic version, which is cheaper than brand name medicines.  - Varenicline (brand names: Chantix, Champix) is a prescription medicine that reduces withdrawal symptoms and cigarette cravings. If you think you'd like to take varenicline and you have a history of depression, anxiety, or heart disease, discuss this with your doctor or nurse before taking the medicine. Varenicline can also increase the effects of alcohol in some people. It's a good idea to limit drinking while you're taking it, at least until you know how it affects you.  You can also call 1-800-QUIT-NOW 256-653-2852) for free smoking cessation counseling.  I have called in some Nicotine patches. Please place one patch on your upper outer arm once a day.   Please follow up in 3 months.  Take care, Dr. Tarry Kos

## 2019-07-13 NOTE — Progress Notes (Signed)
Subjective:   Patient ID: Monica Sandoval    DOB: 02/26/1976, 43 y.o. female   MRN: [id number]  Monica Sandoval is a 43 y.o. female with a history of internal hemorrhoids, hiatal hernia, osteoarthritis, HLD, tobacco dependence  here for follow up of DVT.  Left LE DVT:  Patient presented to PCP on 11/16 complaining of intermittent atypical chest pain and left LE pain. Was followed up by ED on same day and found to have age indeterminate DVT of left posterior tibial veins and left peroneal veins. CTA negative for PE. Patient was started on Eliquis 5mg  BID and discharged home. Patient has an IUD and is not on OCPs or other hormonal therapy. She is an active smoker of 1 ppd x 15 years. She notes she has family history of blood clots - her mother. She notes the Eliquis is making her feel nauseous sometimes and a new dull headache upon waking that responds to tylenol. Denies any recent surgeries, long trips, trauma, unexplained weight loss, hemoptysis.  Atypical Chest Pain: Patient notes she continues to have the chronic chest discomfort that she notes she's had for many many years. She notes the pain is on the right side that sometimes radiates to the left. Describes the pain as a dull constant pain, only sometimes a sharp pain. Usually self resovles in 30 mins. She notes that does notice the chest discomfort is worse if she forgets to take her antiacids.  Denies any chest pan with exertion. Currently taking Nexium 40mg  QD. Notes father died of a massive heart attack at 43yo. She is interested in a cardiology referral. EKG on 11/17 with NSR without any ST changes. Most recent HS Troponin was negative. No history of hypertension.  Tobacco Abuse She notes she smokes 1PPD x 1 year. She has had successful times of cessation but ended up restarting again. 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 She notes she has tried Chantix in the past that made her very nauseated. She has tried supplementing with Vaping but got nervous with the rates of hospitalizations due to Vaping associated lung disease. She is not interested in the 1-800-Quitline. She has tried the gum in the past but did not like it very much. She is not interested in any types of anti-depressents such as Wellbutrin.   Review of Systems:  Per HPI.   [place], medications and smoking status reviewed.  Objective:   BP 125/70    Pulse 78    Wt 213 lb 3.2 oz (96.7 kg)    SpO2 98%    BMI 35.48 kg/m  Vitals and nursing note reviewed.  General: well appearing middle aged female, in no acute distress with non-toxic appearance, sitting comfortably in exam chair CV: regular rate and rhythm without murmurs, rubs, or gallops Lungs: clear to auscultation bilaterally with normal work of breathing Skin: warm, dry Extremities: warm and well perfused, No LE edema, calf size symmetric, some tenderness to medial right LE to palpation, lateral left LE no longer tender to palpation MSK: gait normal, chest nontender to palpation Neuro: Alert and oriented, speech normal  Assessment & Plan:   Acute deep vein thrombosis (DVT) of tibial vein of left lower extremity ([place]) Most likely provoked by long smoking history of 1 PPD x 15 years. Appears patient does have family history of blood clots, thus if recurrent, a hematology work  up may be indicated. At this time, continue Eliquis 5mg  BID x 3 months and compression stockings QD. Plan to work on smoking cessation during this time. Patient to follow up in 3 month to discuss if further/life-long anticoagulation is indicated. Return precuations discussed including unilateral LE pain or swelling, development of chest pain or difficulty breathing. Patient understood and agreed to plan. Refill provided.  Chest pain Chronic, unchanged. Appears likely secondary to hiatal hernia and GERD as it does appear  to worsen when she forgets to take her antacids. CTA negative for PE. EKG and troponin normal on 11/19. Chest pain nonreproducable to palpation. Nonexertional in nature. These area all reassuring. Patient does have a family history of heart disease and has HLD and is an active smoker which raises her risk. Discussed need for cardiology referral. Given that this appears to cause worry for patient, will place referral.   TOBACCO DEPENDENCE >50% of the visit spend on smoking cessation. Patient has failed Chantix and nicotine gum. Not interested in Wellbutrin or the 1800QuitLine. Patient is interested in trying the Nicotine Patch. She noted she will also try to use Vaping as a way to wean herself from smoking as this helped her in the past. Discussed risks and benefits of the different modes of smoking cessation options. Patient is 75% confident in cutting down to a 1/3 of a pack in 3 months. - Rx for Nicotine patches - Resources provided including information for the quit line provided - RTC in 3 months for further discussion  Orders Placed This Encounter  Procedures   Ambulatory referral to Cardiology    Referral Priority:   Routine    Referral Type:   Consultation    Referral Reason:   Specialty Services Required    Requested Specialty:   Cardiology    Number of Visits Requested:   1   Meds ordered this encounter  Medications   nicotine (NICODERM CQ - DOSED IN MG/24 HOURS) 21 mg/24hr patch    Sig: Place 1 patch (21 mg total) onto the skin daily.    Dispense:  28 patch    Refill:  2   apixaban (ELIQUIS) 5 MG TABS tablet    Sig: Take 2 tablets (10mg ) twice daily for 7 days, then 1 tablet (5mg ) twice daily    Dispense:  60 tablet    Refill:  1    12/19, DO PGY-2, [facility] 07/14/2019 1:25 PM

## 2019-07-14 DIAGNOSIS — I82442 Acute embolism and thrombosis of left tibial vein: Secondary | ICD-10-CM | POA: Insufficient documentation

## 2019-07-14 NOTE — Assessment & Plan Note (Addendum)
Chronic, unchanged. Appears likely secondary to hiatal hernia and GERD as it does appear to worsen when she forgets to take her antacids. CTA negative for PE. EKG and troponin normal on 11/19. Chest pain nonreproducable to palpation. Nonexertional in nature. These area all reassuring. Patient does have a family history of heart disease and has HLD and is an active smoker which raises her risk. Discussed need for cardiology referral. Given that this appears to cause worry for patient, will place referral.

## 2019-07-14 NOTE — Assessment & Plan Note (Addendum)
Most likely provoked by long smoking history of 1 PPD x 15 years. Appears patient does have family history of blood clots, thus if recurrent, a hematology work up may be indicated. At this time, continue Eliquis 5mg  BID x 3 months and compression stockings QD. Plan to work on smoking cessation during this time. Patient to follow up in 3 month to discuss if further/life-long anticoagulation is indicated. Return precuations discussed including unilateral LE pain or swelling, development of chest pain or difficulty breathing. Patient understood and agreed to plan. Refill provided.

## 2019-07-14 NOTE — Assessment & Plan Note (Signed)
>  50% of the visit spend on smoking cessation. Patient has failed Chantix and nicotine gum. Not interested in Wellbutrin or the 1800QuitLine. Patient is interested in trying the Nicotine Patch. She noted she will also try to use Vaping as a way to wean herself from smoking as this helped her in the past. Discussed risks and benefits of the different modes of smoking cessation options. Patient is 75% confident in cutting down to a 1/3 of a pack in 3 months. - Rx for Nicotine patches - Resources provided including information for the quit line provided - RTC in 3 months for further discussion

## 2019-07-15 ENCOUNTER — Telehealth: Payer: Self-pay | Admitting: Family Medicine

## 2019-07-15 DIAGNOSIS — I82442 Acute embolism and thrombosis of left tibial vein: Secondary | ICD-10-CM

## 2019-07-15 MED ORDER — APIXABAN 5 MG PO TABS: 5.0000 mg | ORAL_TABLET | Freq: Two times a day (BID) | ORAL | 2 refills | Status: DC

## 2019-07-15 NOTE — Telephone Encounter (Signed)
Refilled starter pack of Eliquis by mistake. Correct rx of Eliquis sent to pharmacy. Patient had not picked up next rx yet.  Pharmacy and patient notified of correction.

## 2019-07-30 NOTE — Progress Notes (Signed)
Cardiology Office Note:    Date:  07/31/2019   ID:  Monica Sandoval, DOB 12/15/1975, MRN [id number]  PCP:  Arlyce HarmanLockamy, Timothy, DO  Cardiologist:  No primary care provider on file.  Electrophysiologist:  None   Referring MD: Westley ChandlerBrown, Carina M, MD   Chief Complaint  Patient presents with  . Chest Pain    History of Present Illness:    Monica Sandoval is a 43 y.o. female with a hx of DVT, hyperlipidemia, tobacco use, asthma, hiatal hernia who is referred by Dr. Manson PasseyBrown for evaluation of chest pain.  Presented to ED on 07/09/2019 with atypical chest pain and left lower extremity pain.  Found to have age-indeterminate DVT of left posterior tibial veins and left peroneal veins.  Had been having leg pain x 2 months.  CTPA was negative for PE.  Started on Eliquis.  Reports that she has been having chest pain daily.  Describes pain as sometimes right-sided, but can be left-sided.  Describes as dull aching pain, 5 out of 10 in intensity.  Typically last for 30 minutes or so and resolves.  No clear relationship with exertion, can occur at rest.  Smoked 1ppd x 20 years.   Father died of MI at age 43.       Past Medical History:  Diagnosis Date  . Asthma   . HERNIA, HIATAL, NONCONGENITAL 10/20/2006   Qualifier: Diagnosis of  By: Knox Royaltydell, Erin      Past Surgical History:  Procedure Laterality Date  . CHOLECYSTECTOMY  2008    Current Medications: Current Meds  Medication Sig  . apixaban (ELIQUIS) 5 MG TABS tablet Take 1 tablet (5 mg total) by mouth 2 (two) times daily.  Marland Kitchen. esomeprazole (NEXIUM) 40 MG capsule Take 40 mg by mouth daily at 12 noon.  . nicotine (NICODERM CQ - DOSED IN MG/24 HOURS) 21 mg/24hr patch Place 1 patch (21 mg total) onto the skin daily.  . ondansetron (ZOFRAN) 4 MG tablet Take 1 tablet (4 mg total) by mouth every 8 (eight) hours as needed for nausea or vomiting.  . rosuvastatin (CRESTOR) 10 MG tablet Take 1 tablet (10 mg total) by mouth daily.  . VENTOLIN HFA 108 (90 Base) MCG/ACT  inhaler INHALE 2 PUFFS BY MOUTH EVERY 4 HOURS AS NEEDED FOR WHEEZE     Allergies:   Patient has no known allergies.   Social History   Socioeconomic History  . Marital status: Married    Spouse name: Not on file  . Number of children: Not on file  . Years of education: Not on file  . Highest education level: Not on file  Occupational History  . Not on file  Social Needs  . Financial resource strain: Not on file  . Food insecurity    Worry: Not on file    Inability: Not on file  . Transportation needs    Medical: Not on file    Non-medical: Not on file  Tobacco Use  . Smoking status: Current Every Day Smoker    Packs/day: 0.50    Years: 8.00    Pack years: 4.00    Types: Cigarettes  . Smokeless tobacco: Never Used  . Tobacco comment: Sts she quit 09/12/16  Substance and Sexual Activity  . Alcohol use: No  . Drug use: No  . Sexual activity: Yes    Birth control/protection: I.U.D.  Lifestyle  . Physical activity    Days per week: Not on file    Minutes per session: Not on  file  . Stress: Not on file  Relationships  . Social Musician on phone: Not on file    Gets together: Not on file    Attends religious service: Not on file    Active member of club or organization: Not on file    Attends meetings of clubs or organizations: Not on file    Relationship status: Not on file  Other Topics Concern  . Not on file  Social History Narrative  . Not on file     Family History: The patient's family history includes Arthritis in her mother; Asthma in her mother; Cancer in her paternal grandmother; Diabetes in her father and paternal grandmother; Early death in her father; Heart disease in her father and paternal grandmother; Rheum arthritis in her mother.  ROS:   Please see the history of present illness.     All other systems reviewed and are negative.  EKGs/Labs/Other Studies Reviewed:    The following studies were reviewed today:   EKG:  EKG is   ordered today.  The ekg ordered today demonstrates normal sinus rhythm, rate 70, no ST/T abnormalities  Recent Labs: 10/02/2018: ALT 14; TSH 3.130 12/22/2018: BNP 13.2 07/09/2019: BUN 7; Creatinine, Ser 0.81; Hemoglobin 14.6; Platelets 273; Potassium 4.3; Sodium 141  Recent Lipid Panel    Component Value Date/Time   CHOL 183 10/02/2018 1624   TRIG 102 10/02/2018 1624   HDL 49 10/02/2018 1624   CHOLHDL 3.7 10/02/2018 1624   CHOLHDL 3.3 11/12/2011 1659   VLDL 17 11/12/2011 1659   LDLCALC 114 (H) 10/02/2018 1624    Physical Exam:    VS:  BP 123/82   Pulse 70   Ht 5\' 5"  (1.651 m)   Wt 209 lb (94.8 kg)   SpO2 100%   BMI 34.78 kg/m     Wt Readings from Last 3 Encounters:  07/31/19 209 lb (94.8 kg)  07/13/19 213 lb 3.2 oz (96.7 kg)  07/09/19 216 lb 4 oz (98.1 kg)     GEN:  Well nourished, well developed in no acute distress HEENT: Normal NECK: No JVD LYMPHATICS: No lymphadenopathy CARDIAC: RRR, no murmurs, rubs, gallops RESPIRATORY:  Clear to auscultation without rales, wheezing or rhonchi  ABDOMEN: Soft, non-tender, non-distended MUSCULOSKELETAL:  No edema; No deformity  SKIN: Warm and dry NEUROLOGIC:  Alert and oriented x 3 PSYCHIATRIC:  Normal affect   ASSESSMENT:    1. Precordial pain   2. Pre-procedure lab exam   3. Acute deep vein thrombosis (DVT) of tibial vein of left lower extremity ([facility])   4. Hyperlipidemia, unspecified hyperlipidemia type   5. Tobacco use    PLAN:    Chest pain: Atypical in description, but does have significant risk factors for coronary artery disease (tobacco use, hyperlipidemia, family history).  Overall would classify as low to intermediate risk of obstructive CAD. -Coronary CTA  LLE DVT: Continue Eliquis 5 mg twice daily  Tobacco use: Smoked 1 pack/day x 20 years.  Patient was counseled on the risks of tobacco use and cessation strongly encouraged  Hyperlipidemia: on rosuvastatin 10 mg daily.  LDL 114 on 10/02/18.    RTC in 3  months  Medication Adjustments/Labs and Tests Ordered: Current medicines are reviewed at length with the patient today.  Concerns regarding medicines are outlined above.  Orders Placed This Encounter  Procedures  . CT CORONARY MORPH W/CTA COR W/SCORE W/CA W/CM &/OR WO/CM  . CT CORONARY FRACTIONAL FLOW RESERVE DATA PREP  .  CT CORONARY FRACTIONAL FLOW RESERVE FLUID ANALYSIS  . Basic metabolic panel  . EKG 12-Lead   Meds ordered this encounter  Medications  . diltiazem (CARDIZEM) 60 MG tablet    Sig: Take 60 mg 2 hours before Coronary CT    Dispense:  1 tablet    Refill:  0    Patient Instructions  Medication Instructions:  Continue same medications   Lab Work: Bmet to be done 7 days before Coronary CT  Lab order enclosed   Testing/Procedures: Coronary CT   Scheduler will call with appointment after approved by insurance Follow instructions below  Follow-Up: At [facility], you and your health needs are our priority.  As part of our continuing mission to provide you with exceptional heart care, we have created designated Provider Care Teams.  These Care Teams include your primary Cardiologist (physician) and Advanced Practice Providers (APPs -  Physician Assistants and Nurse Practitioners) who all work together to provide you with the care you need, when you need it.  Your next appointment:  2 months  The format for your next appointment:  Office  Provider:  Dr.Jennfer Gassen     Your cardiac CT will be scheduled at one of the below locations:   [facility] [address] [place], [place] [address] [phone]  [place] [address] [place], [place] [address] [phone]  If scheduled at [facility], please arrive at the [facility] main entrance of [facility] 30-45 minutes prior to test start time. Proceed to the [facility] Radiology Department (first floor) to check-in and  test prep.  If scheduled at [facility], please arrive 15 mins early for check-in and test prep.  Please follow these instructions carefully (unless otherwise directed):    On the Night Before the Test: . Be sure to Drink plenty of water. . Do not consume any caffeinated/decaffeinated beverages or chocolate 12 hours prior to your test. . Do not take any antihistamines 12 hours prior to your test.   On the Day of the Test: . Drink plenty of water. Do not drink any water within one hour of the test. . Do not eat any food 4 hours prior to the test. . You may take your regular medications prior to the test.  . Take Diltiazem 60 mg two hours prior to test. . FEMALES- please wear underwire-free bra if available        After the Test: . Drink plenty of water. . After receiving IV contrast, you may experience a mild flushed feeling. This is normal. . On occasion, you may experience a mild rash up to 24 hours after the test. This is not dangerous. If this occurs, you can take Benadryl 25 mg and increase your fluid intake. . If you experience trouble breathing, this can be serious. If it is severe call 911 IMMEDIATELY. If it is mild, please call our office.    Once we have confirmed authorization from your insurance company, we will call you to set up a date and time for your test.   For non-scheduling related questions, please contact the cardiac imaging nurse navigator should you have any questions/concerns: Marchia Bond, RN Navigator Cardiac Imaging [facility] [phone] Office        Signed, Donato Heinz, MD  07/31/2019 11:46 AM    [place]

## 2019-07-31 ENCOUNTER — Other Ambulatory Visit: Payer: Self-pay

## 2019-07-31 ENCOUNTER — Ambulatory Visit (INDEPENDENT_AMBULATORY_CARE_PROVIDER_SITE_OTHER): Payer: No Typology Code available for payment source | Admitting: Cardiology

## 2019-07-31 ENCOUNTER — Encounter: Payer: Self-pay | Admitting: Cardiology

## 2019-07-31 VITALS — BP 123/82 | HR 70 | Ht 65.0 in | Wt 209.0 lb

## 2019-07-31 DIAGNOSIS — R072 Precordial pain: Secondary | ICD-10-CM | POA: Diagnosis not present

## 2019-07-31 DIAGNOSIS — I82442 Acute embolism and thrombosis of left tibial vein: Secondary | ICD-10-CM

## 2019-07-31 DIAGNOSIS — Z01812 Encounter for preprocedural laboratory examination: Secondary | ICD-10-CM

## 2019-07-31 DIAGNOSIS — E785 Hyperlipidemia, unspecified: Secondary | ICD-10-CM | POA: Diagnosis not present

## 2019-07-31 DIAGNOSIS — Z720 Tobacco use: Secondary | ICD-10-CM

## 2019-07-31 MED ORDER — DILTIAZEM HCL 60 MG PO TABS: ORAL_TABLET | ORAL | 0 refills | Status: DC

## 2019-07-31 NOTE — Patient Instructions (Addendum)
Medication Instructions:  Continue same medications   Lab Work: Bmet to be done 7 days before Coronary CT  Lab order enclosed   Testing/Procedures: Coronary CT   Scheduler will call with appointment after approved by insurance Follow instructions below  Follow-Up: At [facility], you and your health needs are our priority.  As part of our continuing mission to provide you with exceptional heart care, we have created designated Provider Care Teams.  These Care Teams include your primary Cardiologist (physician) and Advanced Practice Providers (APPs -  Physician Assistants and Nurse Practitioners) who all work together to provide you with the care you need, when you need it.  Your next appointment:  2 months  The format for your next appointment:  Office  Provider:  Dr.Schumann     Your cardiac CT will be scheduled at one of the below locations:   [facility] [place] [address] [place], [place] [address] [phone]  [place] [address] [place], [place] [address] [phone]  If scheduled at [facility], please arrive at the [facility] main entrance of [facility] 30-45 minutes prior to test start time. Proceed to the [facility] Radiology Department (first floor) to check-in and test prep.  If scheduled at [facility], please arrive 15 mins early for check-in and test prep.  Please follow these instructions carefully (unless otherwise directed):    On the Night Before the Test: . Be sure to Drink plenty of water. . Do not consume any caffeinated/decaffeinated beverages or chocolate 12 hours prior to your test. . Do not take any antihistamines 12 hours prior to your test.   On the Day of the Test: . Drink plenty of water. Do not drink any water within one hour of the test. . Do not eat any food 4 hours prior to the test. . You may take your regular  medications prior to the test.  . Take Diltiazem 60 mg two hours prior to test. . FEMALES- please wear underwire-free bra if available        After the Test: . Drink plenty of water. . After receiving IV contrast, you may experience a mild flushed feeling. This is normal. . On occasion, you may experience a mild rash up to 24 hours after the test. This is not dangerous. If this occurs, you can take Benadryl 25 mg and increase your fluid intake. . If you experience trouble breathing, this can be serious. If it is severe call 911 IMMEDIATELY. If it is mild, please call our office.    Once we have confirmed authorization from your insurance company, we will call you to set up a date and time for your test.   For non-scheduling related questions, please contact the cardiac imaging nurse navigator should you have any questions/concerns: Marchia Bond, RN Navigator Cardiac Imaging [facility] [phone] Office

## 2019-08-17 IMAGING — MR MR CERVICAL SPINE W/O CM
4 of 5 series · 27 of 48 positions shown · non-contrast
Comparison: Prior radiographs from 02/28/2014.

CLINICAL DATA: Initial evaluation for numbness in fingers of
bilateral hands, neck pain.

EXAM:
MRI CERVICAL SPINE WITHOUT CONTRAST
TECHNIQUE: Multiplanar, multisequence MR imaging of the cervical spine was
performed. No intravenous contrast was administered.

[Series 6: T1 · sagittal · 3.0mm · 0.66mm/px · 6 of 15 slices shown]
[im 1/15]
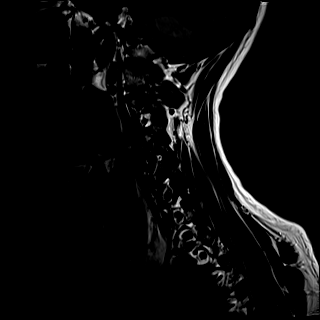
[im 3/15]
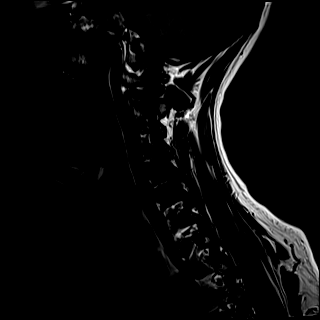
[im 6/15]
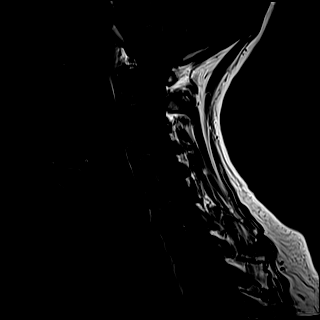
[im 9/15]
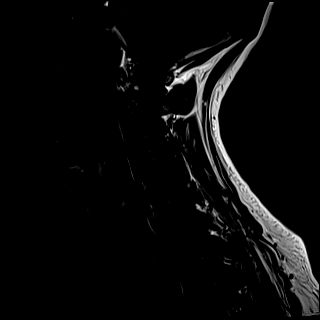
[im 12/15]
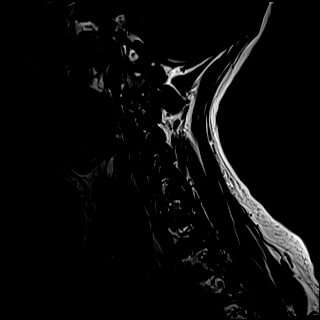
[im 15/15]
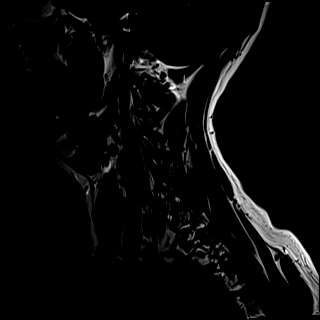

[Series 7: T2 · sagittal · 3.0mm · 0.55mm/px · 7 of 15 slices shown (1 of 2)]
[im 1/15]
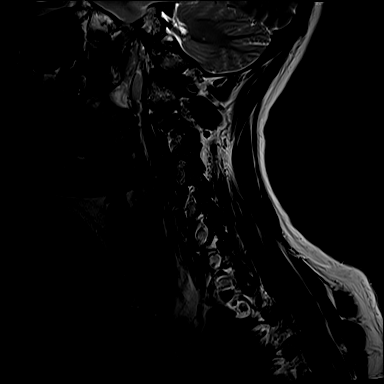
[im 3/15]
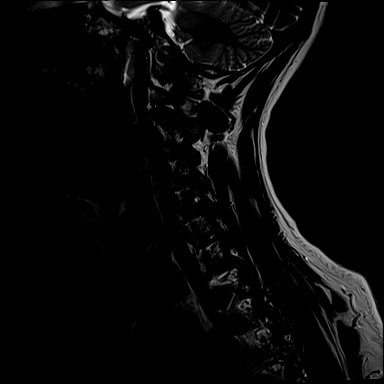
[im 5/15]
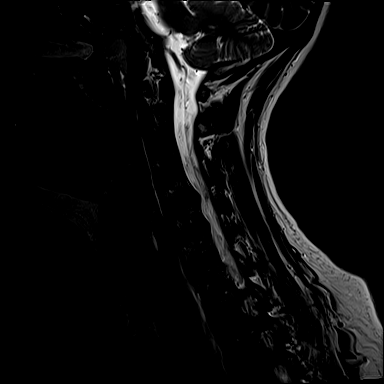
[im 8/15]
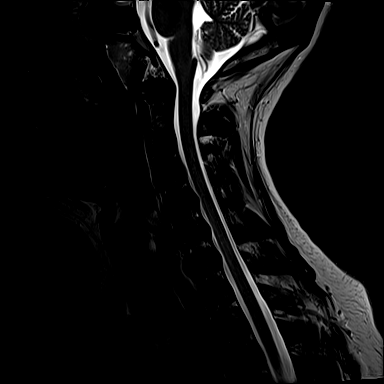
[im 10/15]
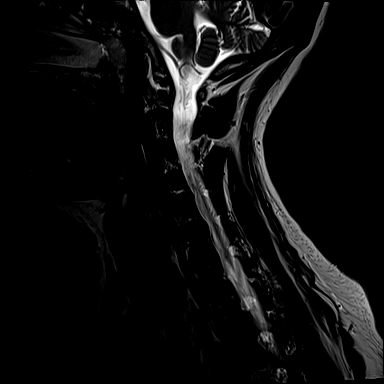
[im 12/15]
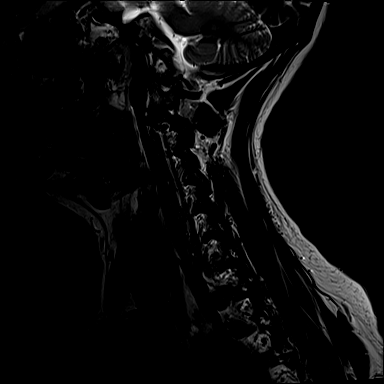
[im 15/15]
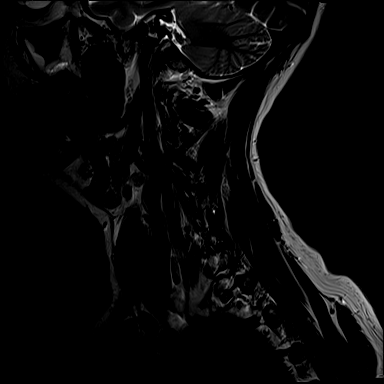

[Series 8: STIR · sagittal · 3.0mm · 0.33mm/px · 6 of 15 slices shown]
[im 1/15]
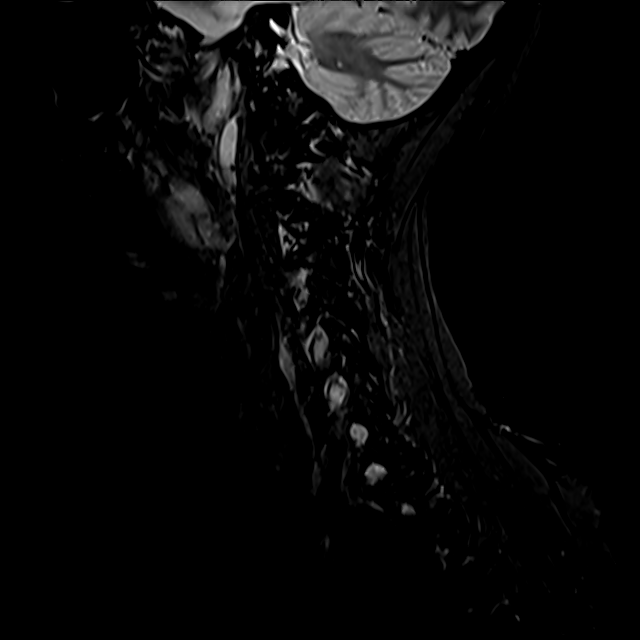
[im 3/15]
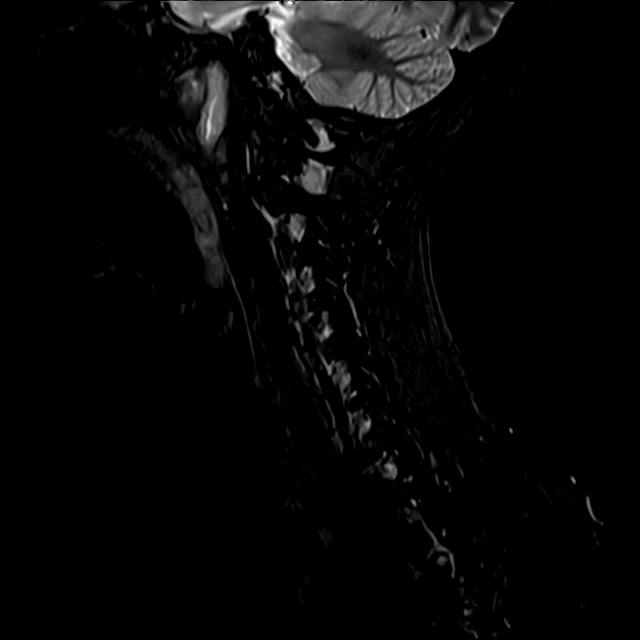
[im 5/15]
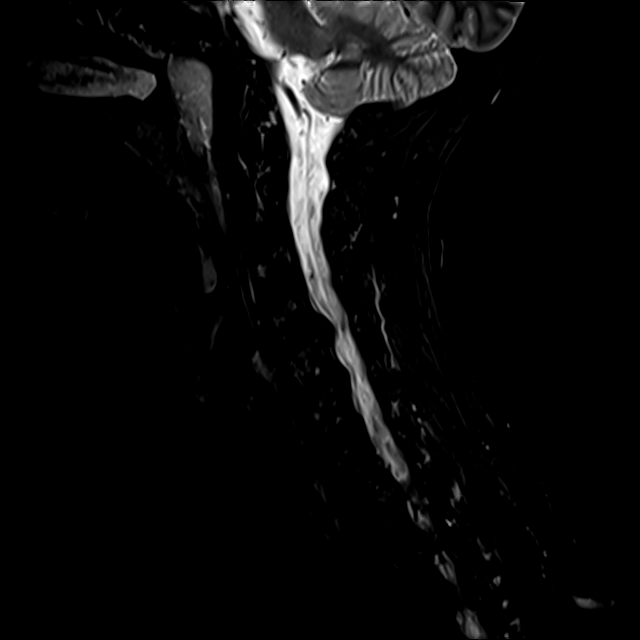
[im 8/15]
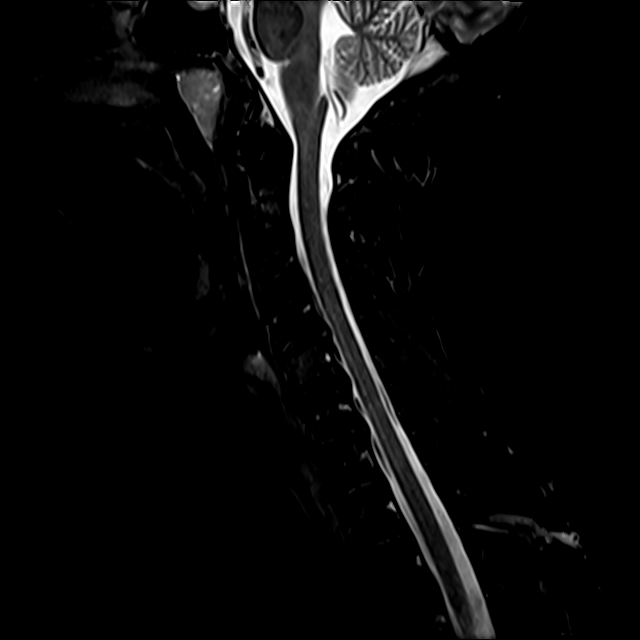
[im 10/15]
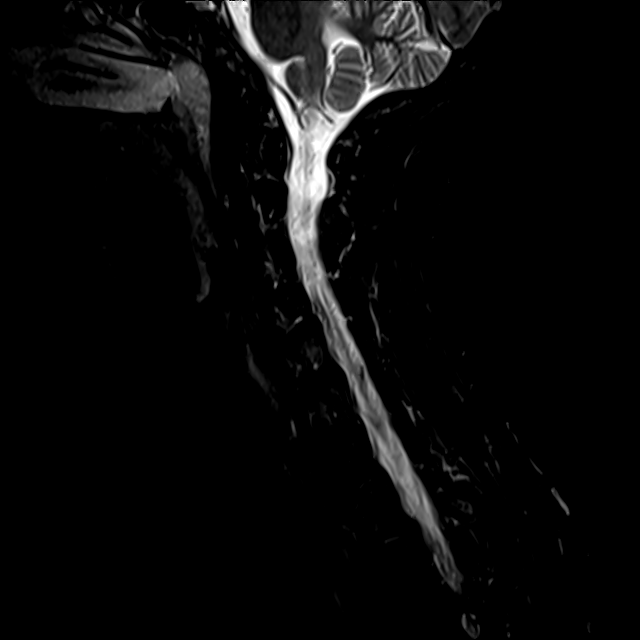
[im 12/15]
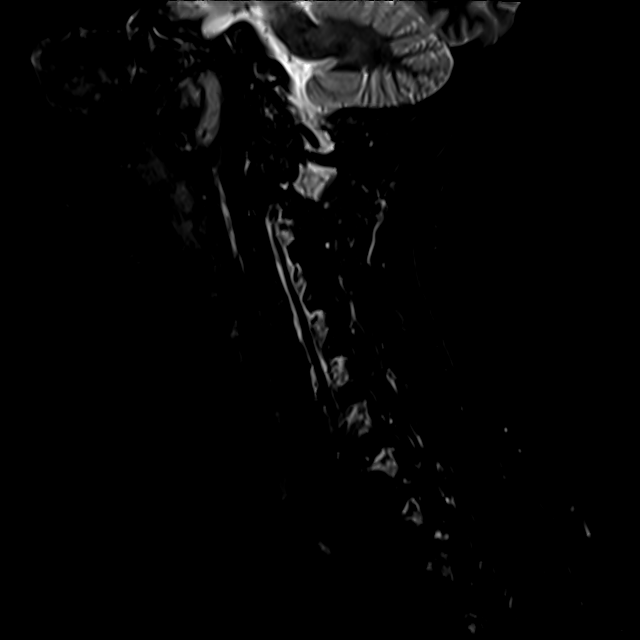

[Series 9: T2 · axial · 3.0mm · 0.50mm/px · z∈[-86,+11]mm · 8 of 32 slices shown (2 of 2)]
[im 1/32]
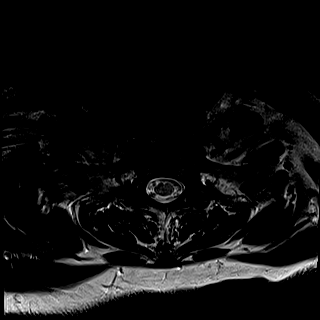
[im 5/32]
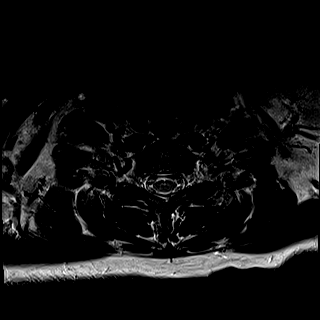
[im 10/32]
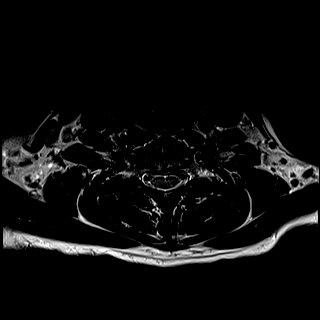
[im 15/32]
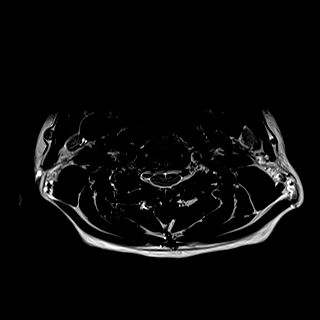
[im 17/32]
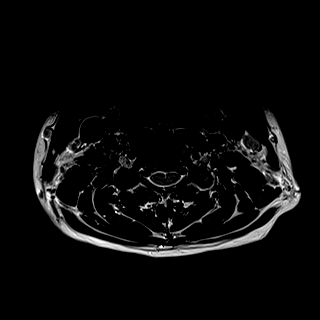
[im 22/32]
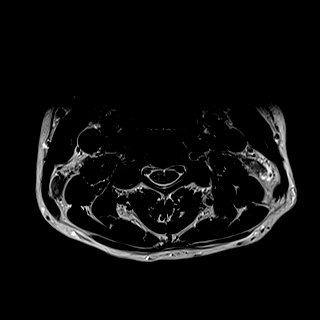
[im 27/32]
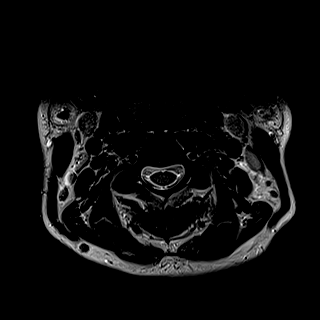
[im 32/32]
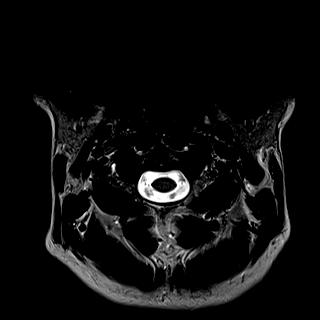

[27 of 48 positions shown; findings below may reference images not displayed]

FINDINGS: Alignment: Straightening of the normal cervical lordosis. No
listhesis.

Vertebrae: Vertebral body heights are maintained without evidence
for acute or chronic fracture. Bone marrow signal intensity within
normal limits. No discrete or worrisome osseous lesions. No abnormal
marrow edema.

Cord: Signal intensity within the cervical spinal cord is normal.

Posterior Fossa, vertebral arteries, paraspinal tissues: Visualized
brain and posterior fossa within normal limits. Craniocervical
junction normal. Paraspinous and prevertebral soft tissues are
normal. Normal intravascular flow voids present within the vertebral
arteries bilaterally.

Disc levels:

C2-C3: Unremarkable.

C3-C4: Tiny central disc protrusion indents the ventral thecal sac.
No stenosis.

C4-C5: Broad and somewhat lobulated right paracentral disc
protrusion flattens and indents the ventral thecal sac, extending
laterally towards the right neural foramen (series 9, image 15).
Mild flattening of the cervical spinal cord without cord signal
changes. Mild spinal stenosis. Superimposed bilateral uncovertebral
hypertrophy. Moderate right C5 foraminal stenosis.

C5-C6: Broad posterior disc protrusion, slightly eccentric to the
right. Flattening with partial effacement of the ventral thecal sac.
Minimal cord flattening without cord signal changes. Mild spinal
stenosis. Right-sided uncovertebral spurring with resultant mild
right C6 foraminal stenosis.

C6-C7: Mild disc bulge. No significant canal or neural foraminal
stenosis.

C7-T1:  Unremarkable.

Visualized upper thoracic spine within normal limits.
IMPRESSION: 1. Broad right paracentral disc protrusion at C4-5 with resultant
mild canal and moderate right C5 foraminal stenosis.
2. Broad posterior disc bulge at C5-6 with resultant mild canal with
right C6 foraminal stenosis.
3. Mild disc bulging at C3-4 and C6-7 without significant stenosis.

## 2019-09-17 ENCOUNTER — Other Ambulatory Visit: Payer: Self-pay | Admitting: *Deleted

## 2019-09-17 DIAGNOSIS — J209 Acute bronchitis, unspecified: Secondary | ICD-10-CM

## 2019-09-17 DIAGNOSIS — J440 Chronic obstructive pulmonary disease with acute lower respiratory infection: Secondary | ICD-10-CM

## 2019-09-17 MED ORDER — ALBUTEROL SULFATE HFA 108 (90 BASE) MCG/ACT IN AERS: INHALATION_SPRAY | RESPIRATORY_TRACT | 2 refills | Status: DC

## 2019-09-21 ENCOUNTER — Encounter: Payer: Self-pay | Admitting: Family Medicine

## 2019-09-21 ENCOUNTER — Other Ambulatory Visit: Payer: Self-pay

## 2019-09-21 ENCOUNTER — Ambulatory Visit (INDEPENDENT_AMBULATORY_CARE_PROVIDER_SITE_OTHER): Payer: No Typology Code available for payment source | Admitting: Family Medicine

## 2019-09-21 ENCOUNTER — Other Ambulatory Visit: Payer: Self-pay | Admitting: Family Medicine

## 2019-09-21 VITALS — BP 108/78 | HR 73 | Wt 210.4 lb

## 2019-09-21 DIAGNOSIS — H9201 Otalgia, right ear: Secondary | ICD-10-CM | POA: Diagnosis not present

## 2019-09-21 NOTE — Patient Instructions (Addendum)
Monica Sandoval,  It was lovely to meet you today.  I am sorry that you are having trouble with your right ear. I am unsure what is causing the pain but it may be due to problems within your right ear I think you should see ENT. It could also be TMJ dysfunction. Please see some information on this below. I have referred you to an ENT specialist.  I have also taken some labs today to rule out other things which could be causing it. I recommend that you see your dentist as soon as possible to get the wisdom teeth extracted.  The meantime please continue to take Tylenol and you could try heat packs or ice packs over the area for pain relief.  Please do not hesitate to contact us if you have any other questions.  Best wishes,  Dr Posey Pronto    Temporomandibular Joint Syndrome  Temporomandibular joint syndrome (TMJ syndrome) is a condition that causes pain in the temporomandibular joints. These joints are located near your ears and allow your jaw to open and close. For people with TMJ syndrome, chewing, biting, or other movements of the jaw can be difficult or painful. TMJ syndrome is often mild and goes away within a few weeks. However, sometimes the condition becomes a long-term (chronic) problem. What are the causes? This condition may be caused by:  Grinding your teeth or clenching your jaw. Some people do this when they are under stress.  Arthritis.  Injury to the jaw.  Head or neck injury.  Teeth or dentures that are not aligned well. In some cases, the cause of TMJ syndrome may not be known. What are the signs or symptoms? The most common symptom of this condition is an aching pain on the side of the head in the area of the TMJ. Other symptoms may include:  Pain when moving your jaw, such as when chewing or biting.  Being unable to open your jaw all the way.  Making a clicking sound when you open your mouth.  Headache.  Earache.  Neck or shoulder pain. How is this  diagnosed? This condition may be diagnosed based on:  Your symptoms and medical history.  A physical exam. Your health care provider may check the range of motion of your jaw.  Imaging tests, such as X-rays or an MRI. You may also need to see your dentist, who will determine if your teeth and jaw are lined up correctly. How is this treated? TMJ syndrome often goes away on its own. If treatment is needed, the options may include:  Eating soft foods and applying ice or heat.  Medicines to relieve pain or inflammation.  Medicines or massage to relax the muscles.  A splint, bite plate, or mouthpiece to prevent teeth grinding or jaw clenching.  Relaxation techniques or counseling to help reduce stress.  A therapy for pain in which an electrical current is applied to the nerves through the skin (transcutaneous electrical nerve stimulation).  Acupuncture. This is sometimes helpful to relieve pain.  Jaw surgery. This is rarely needed. Follow these instructions at home:  Eating and drinking  Eat a soft diet if you are having trouble chewing.  Avoid foods that require a lot of chewing. Do not chew gum. General instructions  Take over-the-counter and prescription medicines only as told by your health care provider.  If directed, put ice on the painful area. ? Put ice in a plastic bag. ? Place a towel between your skin and the  bag. ? Leave the ice on for 20 minutes, 2-3 times a day.  Apply a warm, wet cloth (warm compress) to the painful area as directed.  Massage your jaw area and do any jaw stretching exercises as told by your health care provider.  If you were given a splint, bite plate, or mouthpiece, wear it as told by your health care provider.  Keep all follow-up visits as told by your health care provider. This is important. Contact a health care provider if:  You are having trouble eating.  You have new or worsening symptoms. Get help right away if:  Your jaw  locks open or closed. Summary  Temporomandibular joint syndrome (TMJ syndrome) is a condition that causes pain in the temporomandibular joints. These joints are located near your ears and allow your jaw to open and close.  TMJ syndrome is often mild and goes away within a few weeks. However, sometimes the condition becomes a long-term (chronic) problem.  Symptoms include an aching pain on the side of the head in the area of the TMJ, pain when chewing or biting, and being unable to open your jaw all the way. You may also make a clicking sound when you open your mouth.  TMJ syndrome often goes away on its own. If treatment is needed, it may include medicines to relieve pain, reduce inflammation, or relax the muscles. A splint, bite plate, or mouthpiece may also be used to prevent teeth grinding or jaw clenching. This information is not intended to replace advice given to you by your health care provider. Make sure you discuss any questions you have with your health care provider. Document Revised: 10/21/2017 Document Reviewed: 09/20/2017 Elsevier Patient Education  2020 [employer].

## 2019-09-22 LAB — CBC WITH DIFFERENTIAL/PLATELET
Basophils Absolute: 0.1 10*3/uL (ref 0.0–0.2)
Basos: 1 %
EOS (ABSOLUTE): 0.3 10*3/uL (ref 0.0–0.4)
Eos: 4 %
Hematocrit: 45.9 % (ref 34.0–46.6)
Hemoglobin: 15.3 g/dL (ref 11.1–15.9)
Immature Grans (Abs): 0 10*3/uL (ref 0.0–0.1)
Immature Granulocytes: 0 %
Lymphocytes Absolute: 1.9 10*3/uL (ref 0.7–3.1)
Lymphs: 24 %
MCH: 30.4 pg (ref 26.6–33.0)
MCHC: 33.3 g/dL (ref 31.5–35.7)
MCV: 91 fL (ref 79–97)
Monocytes Absolute: 0.4 10*3/uL (ref 0.1–0.9)
Monocytes: 6 %
Neutrophils Absolute: 5.1 10*3/uL (ref 1.4–7.0)
Neutrophils: 65 %
Platelets: 264 10*3/uL (ref 150–450)
RBC: 5.04 x10E6/uL (ref 3.77–5.28)
RDW: 13.9 % (ref 11.7–15.4)
WBC: 7.7 10*3/uL (ref 3.4–10.8)

## 2019-09-22 LAB — SEDIMENTATION RATE: Sed Rate: 3 mm/hr (ref 0–32)

## 2019-09-24 DIAGNOSIS — H9201 Otalgia, right ear: Secondary | ICD-10-CM | POA: Insufficient documentation

## 2019-09-24 NOTE — Assessment & Plan Note (Signed)
Unclear etiology of right ear pain, however ENT cause is the most likely give nature of pain and hx of untreated cholesteatoma and cholesteatoma evident on exam of right ear. Considered otitis media/externa however there have been no fevers, discharge/pus and ear is normal appearing. Considered Temporal arteritis however pt is young, no hx of PMR, jaw claudication, pain on combing hair etc. Considered dental cause of pain as pt has not had a recent dental check up,  however no evidence of infection/abscess on oral exam. Cranial nerve examination is normal which is reassuring. -ESR and CBC to rule out Temporal arteritis -Amb referral to ENT -Tylenol PRN -Recommended against NSAIDs as pt is on anticoagulant for DVT -Recommended for pt to see dentist for routine check up and wisdom tooth extraction.

## 2019-09-24 NOTE — Progress Notes (Cosign Needed)
   Subjective:    Patient ID: Monica Sandoval, female    DOB: 01/22/1976, 44 y.o.   MRN: [id number]   CC: [name] is a 44 yr old female who presents with ear pain   HPI:  Ear pain 1 week gradual onset, constant, right sided ear pain. Known to have cholesteatoma > 10 years ago and was recommended ENT surgery but did not follow up. Pain is sharp in nature. 4/10 severity. Radiating to the forehead and around right eye. Tylenol has not helped. Exacerbated at night. Has not seen the dentist recently, is aware she needs to have 2 posterior wisdom teeth removed,denies tooth pain. Denies recent ear discharge/pus but has had this in the past. Denies hearing loss, vertigo/balance issues, fevers, teeth clenching, jaw claudication, pain on combing, visual disturbances.  Smoking status reviewed   ROS: pertinent noted in the HPI    Past medical history, surgical, family, and social history reviewed and updated in the EMR as appropriate. Reviewed problem list.   Objective:  BP 108/78   Pulse 73   Wt 210 lb 6.4 oz (95.4 kg)   SpO2 100%   BMI 35.01 kg/m   Vitals and nursing note reviewed  General: NAD, pleasant, able to participate in exam ENT: No erythema of external ears, non tender on palpation, normal TM bilaterally, no bulging. Cholesteatoma present in right posterior roof of right ear canal. Cardiac: RRR, S1 S2 present. normal heart sounds, no murmurs. Respiratory: CTAB, normal effort, No wheezes, rales or rhonchi Extremities: no edema or cyanosis. Skin: warm and dry, no rashes noted Neuro: alert, cranial nerve exam normal, additionally no tenderness on palpation of temporal area, masseter region or over TMJ joint. Normal ROM at TMJ.  Dental: adequate dentition, evidence of dental caries in right posterior wisdom tooth, non tender on palpation. No evidence of dental abscess.  Psych: Normal affect and mood  Assessment & Plan:    Ear pain, right Unclear etiology of right ear pain,  however ENT cause is the most likely give nature of pain and hx of untreated cholesteatoma and cholesteatoma evident on exam of right ear. Considered otitis media/externa however there have been no fevers, discharge/pus and ear is normal appearing. Considered Temporal arteritis however pt is young, no hx of PMR, jaw claudication, pain on combing hair etc. Considered dental cause of pain as pt has not had a recent dental check up,  however no evidence of infection/abscess on oral exam. Cranial nerve examination is normal which is reassuring. -ESR and CBC to rule out Temporal arteritis -Amb referral to ENT -Tylenol PRN -Recommended against NSAIDs as pt is on anticoagulant for DVT -Recommended for pt to see dentist for routine check up and wisdom tooth extraction.   Lattie Haw, MD  [place] PGY-1

## 2019-09-26 ENCOUNTER — Encounter: Payer: Self-pay | Admitting: Family Medicine

## 2019-09-28 ENCOUNTER — Ambulatory Visit (INDEPENDENT_AMBULATORY_CARE_PROVIDER_SITE_OTHER): Payer: No Typology Code available for payment source | Admitting: Otolaryngology

## 2019-09-28 ENCOUNTER — Encounter (INDEPENDENT_AMBULATORY_CARE_PROVIDER_SITE_OTHER): Payer: Self-pay | Admitting: Otolaryngology

## 2019-09-28 ENCOUNTER — Other Ambulatory Visit: Payer: Self-pay

## 2019-09-28 VITALS — Temp 98.1°F

## 2019-09-28 DIAGNOSIS — H9201 Otalgia, right ear: Secondary | ICD-10-CM

## 2019-09-28 DIAGNOSIS — H7101 Cholesteatoma of attic, right ear: Secondary | ICD-10-CM | POA: Diagnosis not present

## 2019-09-28 NOTE — Progress Notes (Signed)
HPI: Monica Sandoval is a 44 y.o. female who presents is referred by Dr. Allena Katz for evaluation of right ear pain and headache.  She is apparently had a longstanding history of a cholesteatoma on the right side that she did not elect to have any surgery done.  This was initially diagnosed about 10 years ago.  She used to have some intermittent drainage from the ear but has not been having any drainage recently.  She has had significant drop in her hearing on the right side.  More recently has been having increasing ear pain and headache. She smokes half a pack a day. She takes Eliquis for history of DVTs..  Past Medical History:  Diagnosis Date  . Asthma   . 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 CHOLECYSTECTOMY  2008   Social History   Socioeconomic History  . Marital status: Married    Spouse name: Not on file  . Number of children: Not on file  . Years of education: Not on file  . Highest education level: Not on file  Occupational History  . Not on file  Tobacco Use  . Smoking status: Current Every Day Smoker    Packs/day: 0.50    Years: 16.00    Pack years: 8.00    Types: Cigarettes    Start date: 2005  . Smokeless tobacco: Never Used  . Tobacco comment: Sts she quit 09/12/16  Substance and Sexual Activity  . Alcohol use: No  . Drug use: No  . Sexual activity: Yes    Birth control/protection: I.U.D.  Other Topics Concern  . Not on file  Social History Narrative  . Not on file   Social Determinants of Health   Financial Resource Strain:   . Difficulty of Paying Living Expenses: Not on file  Food Insecurity:   . Worried About Programme researcher, broadcasting/film/video in the Last Year: Not on file  . Ran Out of Food in the Last Year: Not on file  Transportation Needs:   . Lack of Transportation (Medical): Not on file  . Lack of Transportation (Non-Medical): Not on file  Physical Activity:    . Days of Exercise per Week: Not on file  . Minutes of Exercise per Session: Not on file  Stress:   . Feeling of Stress : Not on file  Social Connections:   . Frequency of Communication with Friends and Family: Not on file  . Frequency of Social Gatherings with Friends and Family: Not on file  . Attends Religious Services: Not on file  . Active Member of Clubs or Organizations: Not on file  . Attends Banker Meetings: Not on file  . Marital Status: Not on file   Family History  Problem Relation Age of Onset  . Asthma Mother   . Rheum arthritis Mother   . Arthritis Mother   . Diabetes Father   . Heart disease Father   . Early death Father   . Diabetes Paternal Grandmother   . Heart disease Paternal Grandmother   . Cancer Paternal Grandmother    No Known Allergies Prior to Admission medications   Medication Sig Start Date End Date Taking? Authorizing Provider  albuterol (VENTOLIN HFA) 108 (90 Base) MCG/ACT inhaler INHALE 2 PUFFS BY MOUTH EVERY 4 HOURS AS NEEDED FOR WHEEZE 09/17/19  Yes Lockamy, Timothy, DO  apixaban (ELIQUIS) 5 MG TABS tablet Take  1 tablet (5 mg total) by mouth 2 (two) times daily. 07/15/19  Yes Mullis, Kiersten P, DO  diltiazem (CARDIZEM) 60 MG tablet Take 60 mg 2 hours before Coronary CT 07/31/19  Yes Donato Heinz, MD  esomeprazole (NEXIUM) 40 MG capsule Take 40 mg by mouth daily at 12 noon.   Yes [provider]  nicotine (NICODERM CQ - DOSED IN MG/24 HOURS) 21 mg/24hr patch Place 1 patch (21 mg total) onto the skin daily. 07/13/19  Yes Mullis, Kiersten P, DO  ondansetron (ZOFRAN) 4 MG tablet Take 1 tablet (4 mg total) by mouth every 8 (eight) hours as needed for nausea or vomiting. 07/11/19  Yes Lockamy, Timothy, DO  rosuvastatin (CRESTOR) 10 MG tablet Take 1 tablet (10 mg total) by mouth daily. 12/22/18  Yes Lockamy, Timothy, DO     Positive ROS: Otherwise negative. no vertigo or dizziness.  All other systems have been  reviewed and were otherwise negative with the exception of those mentioned in the HPI and as above.  Physical Exam: Constitutional: Alert, well-appearing, no acute distress Ears: External ears without lesions or tenderness.  Left ear canal and left TM are normal.  Right ear canal reveals some scabbing and crusting on the superior aspect of the medial portion of the ear canal with what appears to be a cholesteatoma in the attic area.  There is some granulation tissue appear but no active drainage noted. Nasal: External nose without lesions. Septum with minimal deformity.. Clear nasal passages with no signs of infection.  On tuning fork testing she had diminished hearing on the right side with AC > BC on the right.  Weber lateralized to the left. Oral: Lips and gums without lesions. Tongue and palate mucosa without lesions. Posterior oropharynx clear. Neck: No palpable adenopathy or masses Respiratory: Breathing comfortably  Skin: No facial/neck lesions or rash noted.  Cerumen impaction removal  Date/Time: 09/28/2019 5:53 PM Performed by: Rozetta Nunnery, MD Authorized by: Rozetta Nunnery, MD   Consent:    Consent obtained:  Verbal   Consent given by:  Patient Procedure details:    Location:  R ear   Procedure type: curette and suction   Post-procedure details:    Patient tolerance of procedure:  Tolerated well, no immediate complications Comments:     Patient has some crusting and scabbing superiorly in the attic area that was removed.  She has some granulation tissue consistent with cholesteatoma but no active drainage noted.    Assessment: Right ear cholesteatoma.  This may be causing the ear pain and headache.  Plan: We have scheduled the patient for a CT scan of the temporal bone to assess the extent of the cholesteatoma.  She will follow up here following the CT scan.  We will also schedule audiogram on return visit to evaluate hearing loss.   Radene Journey,  MD   CC:

## 2019-10-07 NOTE — Progress Notes (Signed)
Cardiology Office Note:    Date:  10/08/2019   ID:  Collie Siad, DOB Apr 12, 1976, MRN [id number]  PCP:  Arlyce Harman, DO  Cardiologist:  No primary care provider on file.  Electrophysiologist:  None   Referring MD: Arlyce Harman, DO   Chief Complaint  Patient presents with  . Chest Pain    History of Present Illness:    Monica Sandoval is a 44 y.o. female with a hx of DVT, hyperlipidemia, tobacco use, asthma, hiatal hernia who presents for follow-up.  She was referred by Dr. Manson Passey for evaluation of chest pain, initial visit on 07/31/2019.  Presented to ED on 07/09/2019 with atypical chest pain and left lower extremity pain.  Found to have age-indeterminate DVT of left posterior tibial veins and left peroneal veins.  Had been having leg pain x 2 months.  CTPA was negative for PE.  Started on Eliquis.  Reports that she has been having chest pain daily.  Describes pain as sometimes right-sided, but can be left-sided.  Describes as dull aching pain, 5 out of 10 in intensity.  Typically last for 30 minutes or so and resolves.  No clear relationship with exertion, can occur at rest.  Smoked 1ppd x 20 years.   Father died of MI at age 62.      Coronary CT was ordered but her insurance would not cover.  Reports chest pain has improved, no episodes over last week.  She has not been exercising, but is on her feet all day at work.  She denies any chest pain or dyspnea on exertion.  Denies any lightheadedness or syncope.  Does report she has been having some lower extremity edema.  She continues to smoke half a pack per day.  She tried Chantix but became nauseated.  She currently is using nicotine patches.    Past Medical History:  Diagnosis Date  . Asthma   . 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 CHOLECYSTECTOMY  2008    Current Medications: Current Meds  Medication Sig  .  albuterol (VENTOLIN HFA) 108 (90 Base) MCG/ACT inhaler INHALE 2 PUFFS BY MOUTH EVERY 4 HOURS AS NEEDED FOR WHEEZE  . apixaban (ELIQUIS) 5 MG TABS tablet Take 1 tablet (5 mg total) by mouth 2 (two) times daily.  Marland Kitchen esomeprazole (NEXIUM) 40 MG capsule Take 40 mg by mouth daily at 12 noon.  . nicotine (NICODERM CQ - DOSED IN MG/24 HOURS) 21 mg/24hr patch Place 1 patch (21 mg total) onto the skin daily.  . ondansetron (ZOFRAN) 4 MG tablet Take 1 tablet (4 mg total) by mouth every 8 (eight) hours as needed for nausea or vomiting.  . rosuvastatin (CRESTOR) 10 MG tablet Take 1 tablet (10 mg total) by mouth daily.  . [DISCONTINUED] diltiazem (CARDIZEM) 60 MG tablet Take 60 mg 2 hours before Coronary CT     Allergies:   Patient has no known allergies.   Social History   Socioeconomic History  . Marital status: Married    Spouse name: Not on file  . Number of children: Not on file  . Years of education: Not on file  . Highest education level: Not on file  Occupational History  . Not on file  Tobacco Use  . Smoking status: Current Every Day Smoker    Packs/day: 0.50    Years: 16.00    Pack years: 8.00  Types: Cigarettes    Start date: 2005  . Smokeless tobacco: Never Used  . Tobacco comment: Sts she quit 09/12/16  Substance and Sexual Activity  . Alcohol use: No  . Drug use: No  . Sexual activity: Yes    Birth control/protection: I.U.D.  Other Topics Concern  . Not on file  Social History Narrative  . Not on file   Social Determinants of Health   Financial Resource Strain:   . Difficulty of Paying Living Expenses: Not on file  Food Insecurity:   . Worried About Charity fundraiser in the Last Year: Not on file  . Ran Out of Food in the Last Year: Not on file  Transportation Needs:   . Lack of Transportation (Medical): Not on file  . Lack of Transportation (Non-Medical): Not on file  Physical Activity:   . Days of Exercise per Week: Not on file  . Minutes of Exercise per  Session: Not on file  Stress:   . Feeling of Stress : Not on file  Social Connections:   . Frequency of Communication with Friends and Family: Not on file  . Frequency of Social Gatherings with Friends and Family: Not on file  . Attends Religious Services: Not on file  . Active Member of Clubs or Organizations: Not on file  . Attends Archivist Meetings: Not on file  . Marital Status: Not on file     Family History: The patient's family history includes Arthritis in her mother; Asthma in her mother; Cancer in her paternal grandmother; Diabetes in her father and paternal grandmother; Early death in her father; Heart disease in her father and paternal grandmother; Rheum arthritis in her mother.  ROS:   Please see the history of present illness.     All other systems reviewed and are negative.  EKGs/Labs/Other Studies Reviewed:    The following studies were reviewed today:   EKG:  EKG is not  ordered today.  The last ekg ordered demonstrates normal sinus rhythm, rate 70, no ST/T abnormalities  Recent Labs: 12/22/2018: BNP 13.2 07/09/2019: BUN 7; Creatinine, Ser 0.81; Potassium 4.3; Sodium 141 09/21/2019: Hemoglobin 15.3; Platelets 264  Recent Lipid Panel    Component Value Date/Time   CHOL 183 10/02/2018 1624   TRIG 102 10/02/2018 1624   HDL 49 10/02/2018 1624   CHOLHDL 3.7 10/02/2018 1624   CHOLHDL 3.3 11/12/2011 1659   VLDL 17 11/12/2011 1659   LDLCALC 114 (H) 10/02/2018 1624    Physical Exam:    VS:  BP 109/68   Pulse 74   Ht 5\' 5"  (1.651 m)   Wt 212 lb 6.4 oz (96.3 kg)   SpO2 100%   BMI 35.35 kg/m     Wt Readings from Last 3 Encounters:  10/08/19 212 lb 6.4 oz (96.3 kg)  09/21/19 210 lb 6.4 oz (95.4 kg)  07/31/19 209 lb (94.8 kg)     GEN:  Well nourished, well developed in no acute distress HEENT: Normal NECK: No JVD LYMPHATICS: No lymphadenopathy CARDIAC: RRR, no murmurs, rubs, gallops RESPIRATORY:  Clear to auscultation without rales, wheezing  or rhonchi  ABDOMEN: Soft, non-tender, non-distended MUSCULOSKELETAL:  No edema; No deformity  SKIN: Warm and dry NEUROLOGIC:  Alert and oriented x 3 PSYCHIATRIC:  Normal affect   ASSESSMENT:    1. Precordial pain   2. Acute deep vein thrombosis (DVT) of tibial vein of left lower extremity (HCC)   3. Tobacco use   4. Hyperlipidemia, unspecified  hyperlipidemia type    PLAN:    Chest pain: Atypical in description, but does have significant risk factors for coronary artery disease (tobacco use, hyperlipidemia, family history).  Coronary CTA was ordered but denied by her insurance.  Will order exercise treadmill test.  Will check TTE to evaluate for structural heart disease  LLE DVT: Continue Eliquis 5 mg twice daily  Tobacco use: Continues to smoke half a pack per day.  Patient was counseled on the risks of tobacco use and cessation strongly encouraged  Hyperlipidemia: on rosuvastatin 10 mg daily.  LDL 114 on 10/02/18.    RTC in 3 months  Medication Adjustments/Labs and Tests Ordered: Current medicines are reviewed at length with the patient today.  Concerns regarding medicines are outlined above.  Orders Placed This Encounter  Procedures  . EXERCISE TOLERANCE TEST (ETT)  . ECHOCARDIOGRAM COMPLETE   No orders of the defined types were placed in this encounter.   Patient Instructions  Medication Instructions:  Your physician recommends that you continue on your current medications as directed. Please refer to the Current Medication list given to you today.  *If you need a refill on your cardiac medications before your next appointment, please call your pharmacy*  Lab Work: NONE  Testing/Procedures: Your physician has requested that you have an exercise tolerance test. For further information please visit [email or web address] Please also follow instruction sheet, as given. ---you will need a COVID test prior   Your physician has requested that you have an echocardiogram.  Echocardiography is a painless test that uses sound waves to create images of your heart. It provides your doctor with information about the size and shape of your heart and how well your heart's chambers and valves are working. This procedure takes approximately one hour. There are no restrictions for this procedure.  This will be done at our [facility] location:  [address]  Follow-Up: At [facility], you and your health needs are our priority.  As part of our continuing mission to provide you with exceptional heart care, we have created designated Provider Care Teams.  These Care Teams include your primary Cardiologist (physician) and Advanced Practice Providers (APPs -  Physician Assistants and Nurse Practitioners) who all work together to provide you with the care you need, when you need it.  Your next appointment:   3 month(s)  The format for your next appointment:   In Person  Provider:   Epifanio Lesches, MD       Signed, Little Ishikawa, MD  10/08/2019 5:44 PM    [facility]

## 2019-10-08 ENCOUNTER — Encounter: Payer: Self-pay | Admitting: Cardiology

## 2019-10-08 ENCOUNTER — Ambulatory Visit (INDEPENDENT_AMBULATORY_CARE_PROVIDER_SITE_OTHER): Payer: No Typology Code available for payment source | Admitting: Cardiology

## 2019-10-08 ENCOUNTER — Other Ambulatory Visit: Payer: Self-pay

## 2019-10-08 VITALS — BP 109/68 | HR 74 | Ht 65.0 in | Wt 212.4 lb

## 2019-10-08 DIAGNOSIS — E785 Hyperlipidemia, unspecified: Secondary | ICD-10-CM

## 2019-10-08 DIAGNOSIS — I82442 Acute embolism and thrombosis of left tibial vein: Secondary | ICD-10-CM | POA: Diagnosis not present

## 2019-10-08 DIAGNOSIS — Z720 Tobacco use: Secondary | ICD-10-CM

## 2019-10-08 DIAGNOSIS — R072 Precordial pain: Secondary | ICD-10-CM

## 2019-10-08 NOTE — Patient Instructions (Signed)
Medication Instructions:  Your physician recommends that you continue on your current medications as directed. Please refer to the Current Medication list given to you today.  *If you need a refill on your cardiac medications before your next appointment, please call your pharmacy*  Lab Work: NONE  Testing/Procedures: Your physician has requested that you have an exercise tolerance test. For further information please visit [email or web address] Please also follow instruction sheet, as given. ---you will need a COVID test prior   Your physician has requested that you have an echocardiogram. Echocardiography is a painless test that uses sound waves to create images of your heart. It provides your doctor with information about the size and shape of your heart and how well your heart's chambers and valves are working. This procedure takes approximately one hour. There are no restrictions for this procedure.  This will be done at our [facility] location:  [address]  Follow-Up: At [facility], you and your health needs are our priority.  As part of our continuing mission to provide you with exceptional heart care, we have created designated Provider Care Teams.  These Care Teams include your primary Cardiologist (physician) and Advanced Practice Providers (APPs -  Physician Assistants and Nurse Practitioners) who all work together to provide you with the care you need, when you need it.  Your next appointment:   3 month(s)  The format for your next appointment:   In Person  Provider:   Epifanio Lesches, MD

## 2019-10-23 ENCOUNTER — Other Ambulatory Visit (HOSPITAL_COMMUNITY): Payer: No Typology Code available for payment source

## 2019-10-25 ENCOUNTER — Telehealth (HOSPITAL_COMMUNITY): Payer: Self-pay

## 2019-10-25 NOTE — Telephone Encounter (Signed)
Encounter complete. 

## 2019-10-26 ENCOUNTER — Other Ambulatory Visit (HOSPITAL_COMMUNITY)
Admission: RE | Admit: 2019-10-26 | Discharge: 2019-10-26 | Disposition: A | Discharge status: Home / Self Care | Payer: No Typology Code available for payment source | Source: Ambulatory Visit | Attending: Cardiology | Admitting: Cardiology

## 2019-10-26 DIAGNOSIS — Z01812 Encounter for preprocedural laboratory examination: Secondary | ICD-10-CM | POA: Diagnosis present

## 2019-10-26 DIAGNOSIS — Z20822 Contact with and (suspected) exposure to covid-19: Secondary | ICD-10-CM | POA: Insufficient documentation

## 2019-10-26 LAB — SARS CORONAVIRUS 2 (TAT 6-24 HRS): SARS Coronavirus 2: NEGATIVE

## 2019-10-30 ENCOUNTER — Other Ambulatory Visit: Payer: Self-pay

## 2019-10-30 ENCOUNTER — Ambulatory Visit (HOSPITAL_COMMUNITY)
Admission: RE | Admit: 2019-10-30 | Discharge: 2019-10-30 | Disposition: A | Discharge status: Home / Self Care | Payer: No Typology Code available for payment source | Source: Ambulatory Visit | Attending: Internal Medicine | Admitting: Internal Medicine

## 2019-10-30 DIAGNOSIS — R072 Precordial pain: Secondary | ICD-10-CM | POA: Diagnosis not present

## 2019-10-30 LAB — EXERCISE TOLERANCE TEST
Estimated workload: 10.4 METS
Exercise duration (min): 9 min
Exercise duration (sec): 0 s
MPHR: 177 {beats}/min
Peak HR: 171 {beats}/min
Percent HR: 97 %
Rest HR: 80 {beats}/min

## 2019-10-31 ENCOUNTER — Ambulatory Visit (HOSPITAL_COMMUNITY): Payer: No Typology Code available for payment source | Attending: Cardiovascular Disease

## 2019-10-31 DIAGNOSIS — R072 Precordial pain: Secondary | ICD-10-CM | POA: Diagnosis not present

## 2019-11-01 ENCOUNTER — Other Ambulatory Visit: Payer: Self-pay | Admitting: Family Medicine

## 2019-11-01 DIAGNOSIS — I82442 Acute embolism and thrombosis of left tibial vein: Secondary | ICD-10-CM

## 2019-11-19 ENCOUNTER — Encounter (INDEPENDENT_AMBULATORY_CARE_PROVIDER_SITE_OTHER): Payer: Self-pay | Admitting: Otolaryngology

## 2019-11-19 ENCOUNTER — Ambulatory Visit (INDEPENDENT_AMBULATORY_CARE_PROVIDER_SITE_OTHER): Payer: No Typology Code available for payment source | Admitting: Otolaryngology

## 2019-11-19 ENCOUNTER — Other Ambulatory Visit: Payer: Self-pay

## 2019-11-19 VITALS — Temp 98.1°F

## 2019-11-19 DIAGNOSIS — H9011 Conductive hearing loss, unilateral, right ear, with unrestricted hearing on the contralateral side: Secondary | ICD-10-CM

## 2019-11-19 NOTE — Progress Notes (Addendum)
HPI: Monica Sandoval is a 44 y.o. female who returns today for evaluation of right ear hearing loss and history of headaches on the right side.  There was a question about a possible right ear cholesteatoma and a CT scan was performed and on review of this there was no cholesteatoma and no middle ear space abnormality noted.  She had a previous [facility] several weeks ago that showed a 30-40 dB right ear conductive hearing loss.  But there was no abnormality noted on the CT scan in the mastoid and middle ear space..  Past Medical History:  Diagnosis Date  . Asthma   . 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 CHOLECYSTECTOMY  2008   Social History   Socioeconomic History  . Marital status: Married    Spouse name: Not on file  . Number of children: Not on file  . Years of education: Not on file  . Highest education level: Not on file  Occupational History  . Not on file  Tobacco Use  . Smoking status: Current Every Day Smoker    Packs/day: 0.50    Years: 16.00    Pack years: 8.00    Types: Cigarettes    Start date: 2005  . Smokeless tobacco: Never Used  . Tobacco comment: Sts she quit 09/12/16  Substance and Sexual Activity  . Alcohol use: No  . Drug use: No  . Sexual activity: Yes    Birth control/protection: I.U.D.  Other Topics Concern  . Not on file  Social History Narrative  . Not on file   Social Determinants of Health   Financial Resource Strain:   . Difficulty of Paying Living Expenses:   Food Insecurity:   . Worried About Programme researcher, broadcasting/film/video in the Last Year:   . Barista in the Last Year:   Transportation Needs:   . Freight forwarder (Medical):   Marland Kitchen Lack of Transportation (Non-Medical):   Physical Activity:   . Days of Exercise per Week:   . Minutes of Exercise per Session:   Stress:   . Feeling of Stress :   Social  Connections:   . Frequency of Communication with Friends and Family:   . Frequency of Social Gatherings with Friends and Family:   . Attends Religious Services:   . Active Member of Clubs or Organizations:   . Attends Banker Meetings:   Marland Kitchen Marital Status:    Family History  Problem Relation Age of Onset  . Asthma Mother   . Rheum arthritis Mother   . Arthritis Mother   . Diabetes Father   . Heart disease Father   . Early death Father   . Diabetes Paternal Grandmother   . Heart disease Paternal Grandmother   . Cancer Paternal Grandmother    No Known Allergies Prior to Admission medications   Medication Sig Start Date End Date Taking? Authorizing Provider  albuterol (VENTOLIN HFA) 108 (90 Base) MCG/ACT inhaler INHALE 2 PUFFS BY MOUTH EVERY 4 HOURS AS NEEDED FOR WHEEZE 09/17/19   Lockamy, Timothy, DO  ELIQUIS 5 MG TABS tablet TAKE 1 TABLET BY MOUTH TWICE A DAY 11/02/19   Lockamy, Timothy, DO  esomeprazole (NEXIUM) 40 MG capsule Take 40 mg by mouth daily at 12 noon.    [provider]  nicotine (NICODERM CQ - DOSED IN MG/24 HOURS)  21 mg/24hr patch Place 1 patch (21 mg total) onto the skin daily. 07/13/19   Mullis, Kiersten P, DO  ondansetron (ZOFRAN) 4 MG tablet Take 1 tablet (4 mg total) by mouth every 8 (eight) hours as needed for nausea or vomiting. 07/11/19   Nuala Alpha, DO  rosuvastatin (CRESTOR) 10 MG tablet Take 1 tablet (10 mg total) by mouth daily. 12/22/18   Nuala Alpha, DO     Positive ROS: Otherwise negative  All other systems have been reviewed and were otherwise negative with the exception of those mentioned in the HPI and as above.  Physical Exam: Constitutional: Alert, well-appearing, no acute distress Ears: External ears without lesions or tenderness.  Left TM is clear.  Right TM is retracted posteriorly with the TM adherent to the I-S joint region.  There was no evidence of cholesteatoma and the attic region today was clear.  It is  difficult to determine if she has erosion of the incus.  On tuning fork testing Weber lateralized to the right and BC was great AC on the right side. Nasal: External nose without lesions. Septum with mild deformity.  Mild rhinitis.. Clear nasal passages.  No signs of infection. Oral: Lips and gums without lesions. Tongue and palate mucosa without lesions. Posterior oropharynx clear. Neck: No palpable adenopathy or masses Respiratory: Breathing comfortably  Skin: No facial/neck lesions or rash noted.  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  Plan: I discussed briefly with patient concerning possible causes of conductive hearing loss including otosclerosis.  Surgery would be an option.  Otherwise would recommend proceeding with use of a hearing aid. I did recommend using nasal steroid spray on a regular basis at night and trying to "pop" her ear daily.  She was able to perform this in the office today without difficulty.   Radene Journey, MD

## 2019-11-22 ENCOUNTER — Encounter (INDEPENDENT_AMBULATORY_CARE_PROVIDER_SITE_OTHER): Payer: Self-pay

## 2019-11-22 ENCOUNTER — Encounter (INDEPENDENT_AMBULATORY_CARE_PROVIDER_SITE_OTHER): Payer: Self-pay | Admitting: Otolaryngology

## 2019-12-20 ENCOUNTER — Other Ambulatory Visit: Payer: Self-pay | Admitting: Family Medicine

## 2020-01-08 ENCOUNTER — Ambulatory Visit (INDEPENDENT_AMBULATORY_CARE_PROVIDER_SITE_OTHER): Payer: No Typology Code available for payment source | Admitting: Cardiology

## 2020-01-08 ENCOUNTER — Encounter: Payer: Self-pay | Admitting: Cardiology

## 2020-01-08 ENCOUNTER — Other Ambulatory Visit: Payer: Self-pay

## 2020-01-08 ENCOUNTER — Ambulatory Visit: Payer: No Typology Code available for payment source | Admitting: Cardiology

## 2020-01-08 VITALS — BP 130/78 | HR 79 | Temp 97.3°F | Ht 65.0 in | Wt 207.0 lb

## 2020-01-08 DIAGNOSIS — M79604 Pain in right leg: Secondary | ICD-10-CM | POA: Diagnosis not present

## 2020-01-08 DIAGNOSIS — E785 Hyperlipidemia, unspecified: Secondary | ICD-10-CM

## 2020-01-08 DIAGNOSIS — M79605 Pain in left leg: Secondary | ICD-10-CM

## 2020-01-08 DIAGNOSIS — I824Z2 Acute embolism and thrombosis of unspecified deep veins of left distal lower extremity: Secondary | ICD-10-CM | POA: Diagnosis not present

## 2020-01-08 DIAGNOSIS — Z720 Tobacco use: Secondary | ICD-10-CM

## 2020-01-08 DIAGNOSIS — R079 Chest pain, unspecified: Secondary | ICD-10-CM | POA: Diagnosis not present

## 2020-01-08 NOTE — Progress Notes (Signed)
Cardiology Office Note:    Date:  01/08/2020   ID:  Monica Sandoval, DOB July 03, 1976, MRN [id number]  PCP:  Nuala Alpha, DO  Cardiologist:  No primary care provider on file.  Electrophysiologist:  None   Referring MD: Nuala Alpha, DO   Chief Complaint  Patient presents with  . Chest Pain    History of Present Illness:    Monica Sandoval is a 44 y.o. female with a hx of DVT, hyperlipidemia, tobacco use, asthma, hiatal hernia who presents for follow-up.  She was referred by Dr. Owens Shark for evaluation of chest pain, initial visit on 07/31/2019.  Presented to ED on 07/09/2019 with atypical chest pain and left lower extremity pain.  Found to have age-indeterminate DVT of left posterior tibial veins and left peroneal veins.  Had been having leg pain x 2 months.  CTPA was negative for PE.  Started on Eliquis.  Reported that she had been having chest pain daily.  Describes pain as sometimes right-sided, but can be left-sided.  Describes as dull aching pain, 5 out of 10 in intensity.  Typically last for 30 minutes or so and resolves.  No clear relationship with exertion, can occur at rest.  Smoked 1ppd x 20 years.   Father died of MI at age 48.      Coronary CT was ordered but her insurance would not cover.  ETT on 10/30/19 showed no evidence of ischemia.  TTE on 10/31/2019 showed normal biventricular function, no significant valvular disease.  Since last clinic visit, she reports that she continues to have intermittent chest pain.  However recently went on a long hike and also plays tennis occasionally.  Has not noted any exertional chest pain.  Has attributed her chest pain to known hiatal hernia.  States that she has been under stress at work and has increased her tobacco use from half a pack to 1 pack/day.  She continues to take Eliquis, states that she was told by her PCP would likely remain on it long-term given her family history of blood clots.  Main complaint today is pain in bilateral calves.   States that she notices it when she has been standing at work for a while.    Past Medical History:  Diagnosis Date  . Asthma   . 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 CHOLECYSTECTOMY  2008    Current Medications: Current Meds  Medication Sig  . albuterol (VENTOLIN HFA) 108 (90 Base) MCG/ACT inhaler INHALE 2 PUFFS BY MOUTH EVERY 4 HOURS AS NEEDED FOR WHEEZE  . ELIQUIS 5 MG TABS tablet TAKE 1 TABLET BY MOUTH TWICE A DAY  . esomeprazole (NEXIUM) 40 MG capsule Take 40 mg by mouth daily at 12 noon.  . fluticasone (FLONASE) 50 MCG/ACT nasal spray SMARTSIG:2 Spray(s) Both Nares Every Night  . nicotine (NICODERM CQ - DOSED IN MG/24 HOURS) 21 mg/24hr patch Place 1 patch (21 mg total) onto the skin daily.  . ondansetron (ZOFRAN) 4 MG tablet Take 1 tablet (4 mg total) by mouth every 8 (eight) hours as needed for nausea or vomiting.  . rosuvastatin (CRESTOR) 10 MG tablet TAKE 1 TABLET BY MOUTH EVERY DAY     Allergies:   Patient has no known allergies.   Social History   Socioeconomic History  . Marital status: Married    Spouse name: Not on file  . Number of  children: Not on file  . Years of education: Not on file  . Highest education level: Not on file  Occupational History  . Not on file  Tobacco Use  . Smoking status: Current Every Day Smoker    Packs/day: 0.50    Years: 16.00    Pack years: 8.00    Types: Cigarettes    Start date: 2005  . Smokeless tobacco: Never Used  . Tobacco comment: Sts she quit 09/12/16  Substance and Sexual Activity  . Alcohol use: No  . Drug use: No  . Sexual activity: Yes    Birth control/protection: I.U.D.  Other Topics Concern  . Not on file  Social History Narrative  . Not on file   Social Determinants of Health   Financial Resource Strain:   . Difficulty of Paying Living Expenses:   Food Insecurity:   . Worried About Patent examiner in the Last Year:   . Barista in the Last Year:   Transportation Needs:   . Freight forwarder (Medical):   Marland Kitchen Lack of Transportation (Non-Medical):   Physical Activity:   . Days of Exercise per Week:   . Minutes of Exercise per Session:   Stress:   . Feeling of Stress :   Social Connections:   . Frequency of Communication with Friends and Family:   . Frequency of Social Gatherings with Friends and Family:   . Attends Religious Services:   . Active Member of Clubs or Organizations:   . Attends Banker Meetings:   Marland Kitchen Marital Status:      Family History: The patient's family history includes Arthritis in her mother; Asthma in her mother; Cancer in her paternal grandmother; Diabetes in her father and paternal grandmother; Early death in her father; Heart disease in her father and paternal grandmother; Rheum arthritis in her mother.  ROS:   Please see the history of present illness.     All other systems reviewed and are negative.  EKGs/Labs/Other Studies Reviewed:    The following studies were reviewed today:   EKG:  EKG is not  ordered today.  The last ekg ordered demonstrates normal sinus rhythm, rate 70, no ST/T abnormalities  Recent Labs: 07/09/2019: BUN 7; Creatinine, Ser 0.81; Potassium 4.3; Sodium 141 09/21/2019: Hemoglobin 15.3; Platelets 264  Recent Lipid Panel    Component Value Date/Time   CHOL 183 10/02/2018 1624   TRIG 102 10/02/2018 1624   HDL 49 10/02/2018 1624   CHOLHDL 3.7 10/02/2018 1624   CHOLHDL 3.3 11/12/2011 1659   VLDL 17 11/12/2011 1659   LDLCALC 114 (H) 10/02/2018 1624    Physical Exam:    VS:  BP 130/78   Pulse 79   Temp (!) 97.3 F (36.3 C)   Ht 5\' 5"  (1.651 m)   Wt 207 lb (93.9 kg)   SpO2 98%   BMI 34.45 kg/m     Wt Readings from Last 3 Encounters:  01/08/20 207 lb (93.9 kg)  10/08/19 212 lb 6.4 oz (96.3 kg)  09/21/19 210 lb 6.4 oz (95.4 kg)     GEN:  Well nourished, well developed in no acute  distress HEENT: Normal NECK: No JVD LYMPHATICS: No lymphadenopathy CARDIAC: RRR, no murmurs, rubs, gallops RESPIRATORY:  Clear to auscultation without rales, wheezing or rhonchi  ABDOMEN: Soft, non-tender, non-distended MUSCULOSKELETAL:  No edema; No deformity  SKIN: Warm and dry NEUROLOGIC:  Alert and oriented x 3 PSYCHIATRIC:  Normal affect  ASSESSMENT:    1. Chest pain of uncertain etiology   2. Bilateral leg pain   3. 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 Tobacco use   5. Hyperlipidemia, unspecified hyperlipidemia type    PLAN:    Chest pain: Atypical in description, but does have significant risk factors for coronary artery disease (tobacco use, hyperlipidemia, family history).  Coronary CTA was ordered but denied by her insurance.  ETT on 10/30/19 showed no evidence of ischemia.  TTE on 10/31/2019 showed normal biventricular function, no significant valvular disease. -No further cardiac work-up recommended  Leg pain: Will check ABIs  LLE DVT: Follows with PCP, on Eliquis 5 mg twice daily.  States that she was told may need to continue long-term given her family history of blood clots  Tobacco use: Smoking 1 pack/day.  Patient was counseled on the risks of tobacco use and cessation strongly encouraged  Hyperlipidemia: on rosuvastatin 10 mg daily.  LDL 114 on 10/02/18.    RTC in 1 year  Medication Adjustments/Labs and Tests Ordered: Current medicines are reviewed at length with the patient today.  Concerns regarding medicines are outlined above.  Orders Placed This Encounter  Procedures  . VAS [name] ABI WITH/WO TBI  . VAS [name] LOWER EXTREMITY ARTERIAL DUPLEX   No orders of the defined types were placed in this encounter.   Patient Instructions  Medication Instructions:  Your physician recommends that you continue on your current medications as directed. Please refer to the Current Medication list given to you  today.  *If you need a refill on your cardiac medications before your next appointment, please call your pharmacy*  Testing/Procedures: Your physician has requested that you have an ankle brachial index (ABI). During this test an ultrasound and blood pressure cuff are used to evaluate the arteries that supply the arms and legs with blood. Allow thirty minutes for this exam. There are no restrictions or special instructions.  Follow-Up: At [facility], you and your health needs are our priority.  As part of our continuing mission to provide you with exceptional heart care, we have created designated Provider Care Teams.  These Care Teams include your primary Cardiologist (physician) and Advanced Practice Providers (APPs -  Physician Assistants and Nurse Practitioners) who all work together to provide you with the care you need, when you need it.  We recommend signing up for the patient portal called "MyChart".  Sign up information is provided on this After Visit Summary.  MyChart is used to connect with patients for Virtual Visits (Telemedicine).  Patients are able to view lab/test results, encounter notes, upcoming appointments, etc.  Non-urgent messages can be sent to your provider as well.   To learn more about what you can do with MyChart, go to [email or web address].    Your next appointment:   12 month(s)  The format for your next appointment:   In Person  Provider:   Epifanio Lesches, MD       Signed, Little Ishikawa, MD  01/08/2020 5:24 PM    [facility]

## 2020-01-08 NOTE — Patient Instructions (Signed)
Medication Instructions:  Your physician recommends that you continue on your current medications as directed. Please refer to the Current Medication list given to you today.  *If you need a refill on your cardiac medications before your next appointment, please call your pharmacy*  Testing/Procedures: Your physician has requested that you have an ankle brachial index (ABI). During this test an ultrasound and blood pressure cuff are used to evaluate the arteries that supply the arms and legs with blood. Allow thirty minutes for this exam. There are no restrictions or special instructions.  Follow-Up: At [facility], you and your health needs are our priority.  As part of our continuing mission to provide you with exceptional heart care, we have created designated Provider Care Teams.  These Care Teams include your primary Cardiologist (physician) and Advanced Practice Providers (APPs -  Physician Assistants and Nurse Practitioners) who all work together to provide you with the care you need, when you need it.  We recommend signing up for the patient portal called "MyChart".  Sign up information is provided on this After Visit Summary.  MyChart is used to connect with patients for Virtual Visits (Telemedicine).  Patients are able to view lab/test results, encounter notes, upcoming appointments, etc.  Non-urgent messages can be sent to your provider as well.   To learn more about what you can do with MyChart, go to [email or web address].    Your next appointment:   12 month(s)  The format for your next appointment:   In Person  Provider:   Christopher Schumann, MD    

## 2020-01-25 ENCOUNTER — Other Ambulatory Visit: Payer: Self-pay | Admitting: Cardiology

## 2020-01-25 DIAGNOSIS — I739 Peripheral vascular disease, unspecified: Secondary | ICD-10-CM

## 2020-01-25 DIAGNOSIS — M79604 Pain in right leg: Secondary | ICD-10-CM

## 2020-01-29 ENCOUNTER — Other Ambulatory Visit: Payer: Self-pay

## 2020-01-29 ENCOUNTER — Ambulatory Visit (HOSPITAL_COMMUNITY)
Admission: RE | Admit: 2020-01-29 | Discharge: 2020-01-29 | Disposition: A | Discharge status: Home / Self Care | Payer: No Typology Code available for payment source | Source: Ambulatory Visit | Attending: Cardiovascular Disease | Admitting: Cardiovascular Disease

## 2020-01-29 DIAGNOSIS — M79604 Pain in right leg: Secondary | ICD-10-CM | POA: Insufficient documentation

## 2020-01-29 DIAGNOSIS — M79605 Pain in left leg: Secondary | ICD-10-CM | POA: Diagnosis present

## 2020-01-29 DIAGNOSIS — I739 Peripheral vascular disease, unspecified: Secondary | ICD-10-CM | POA: Insufficient documentation

## 2020-02-04 ENCOUNTER — Encounter: Payer: Self-pay | Admitting: *Deleted

## 2020-02-21 ENCOUNTER — Other Ambulatory Visit: Payer: Self-pay | Admitting: Family Medicine

## 2020-02-21 DIAGNOSIS — I82442 Acute embolism and thrombosis of left tibial vein: Secondary | ICD-10-CM

## 2020-02-23 NOTE — Telephone Encounter (Signed)
Please have her make an appointment to meet me and discuss length of her anticoagulation.

## 2020-02-26 NOTE — Telephone Encounter (Signed)
Done, Scheduled for 7/13. Sunday Spillers, CMA

## 2020-02-29 ENCOUNTER — Other Ambulatory Visit: Payer: Self-pay

## 2020-02-29 DIAGNOSIS — J440 Chronic obstructive pulmonary disease with acute lower respiratory infection: Secondary | ICD-10-CM

## 2020-02-29 MED ORDER — ALBUTEROL SULFATE HFA 108 (90 BASE) MCG/ACT IN AERS: INHALATION_SPRAY | RESPIRATORY_TRACT | 2 refills | Status: DC

## 2020-03-04 ENCOUNTER — Ambulatory Visit (INDEPENDENT_AMBULATORY_CARE_PROVIDER_SITE_OTHER): Payer: No Typology Code available for payment source | Admitting: Family Medicine

## 2020-03-04 ENCOUNTER — Encounter: Payer: Self-pay | Admitting: Family Medicine

## 2020-03-04 ENCOUNTER — Other Ambulatory Visit: Payer: Self-pay

## 2020-03-04 VITALS — BP 112/78 | HR 95 | Ht 65.0 in | Wt 209.8 lb

## 2020-03-04 DIAGNOSIS — Z1231 Encounter for screening mammogram for malignant neoplasm of breast: Secondary | ICD-10-CM | POA: Diagnosis not present

## 2020-03-04 DIAGNOSIS — Z86718 Personal history of other venous thrombosis and embolism: Secondary | ICD-10-CM | POA: Diagnosis not present

## 2020-03-04 DIAGNOSIS — M549 Dorsalgia, unspecified: Secondary | ICD-10-CM

## 2020-03-04 LAB — POCT URINALYSIS DIP (MANUAL ENTRY)
Bilirubin, UA: NEGATIVE
Glucose, UA: NEGATIVE mg/dL
Ketones, POC UA: NEGATIVE mg/dL
Leukocytes, UA: NEGATIVE
Nitrite, UA: NEGATIVE
Protein Ur, POC: NEGATIVE mg/dL
Spec Grav, UA: 1.01 (ref 1.010–1.025)
Urobilinogen, UA: 0.2 E.U./dL
pH, UA: 6.5 (ref 5.0–8.0)

## 2020-03-04 LAB — POCT UA - MICROSCOPIC ONLY

## 2020-03-04 NOTE — Patient Instructions (Signed)
Thank you for coming to see me today. It was a pleasure. Today we talked about:   For now we will continue your eliquis and re-assess your risk factors yearly.  I have placed an order for your mammogram.  Please call [facility] at [phone] to schedule your appointment within one week.  Regularly exercise, ice your back, and rest.  You can continue tylenol.  Do not use NSAIDs (ibuprofen, aspirin, naproxen, etc.) since you are on Eliquis.  If you decide you want to do PT, please let me know.  We have sent a urine culture as well.  Please follow-up with me in 3 months or if no improvement in back pain in 1 month.  If you have any questions or concerns, please do not hesitate to call the office at [phone].  Best,   Luis Abed, DO   Sacroiliac Joint Dysfunction  Sacroiliac joint dysfunction is a condition that causes inflammation on one or both sides of the sacroiliac (SI) joint. The SI joint connects the lower part of the spine (sacrum) with the two upper portions of the pelvis (ilium). This condition causes deep aching or burning pain in the low back. In some cases, the pain may also spread into one or both buttocks, hips, or thighs. What are the causes? This condition may be caused by:  Pregnancy. During pregnancy, extra stress is put on the SI joints because the pelvis widens.  Injury, such as: ? Injuries from car accidents. ? Sports-related injuries. ? Work-related injuries.  Having one leg that is shorter than the other.  Conditions that affect the joints, such as: ? Rheumatoid arthritis. ? Gout. ? Psoriatic arthritis. ? Joint infection (septic arthritis). Sometimes, the cause of SI joint dysfunction is not known. What are the signs or symptoms? Symptoms of this condition include:  Aching or burning pain in the lower back. The pain may also spread to other areas, such as: ? Buttocks. ? Groin. ? Thighs.  Muscle spasms in or around the painful  areas.  Increased pain when standing, walking, running, stair climbing, bending, or lifting. How is this diagnosed? This condition is diagnosed with a physical exam and medical history. During the exam, the health care provider may move one or both of your legs to different positions to check for pain. Various tests may be done to confirm the diagnosis, including:  Imaging tests to look for other causes of pain. These may include: ? MRI. ? CT scan. ? Bone scan.  Diagnostic injection. A numbing medicine is injected into the SI joint using a needle. If your pain is temporarily improved or stopped after the injection, this can indicate that SI joint dysfunction is the problem. How is this treated? Treatment depends on the cause and severity of your condition. Treatment options may include:  Ice or heat applied to the lower back area after an injury. This may help reduce pain and muscle spasms.  Medicines to relieve pain or inflammation or to relax the muscles.  Wearing a back brace (sacroiliac brace) to help support the joint while your back is healing.  Physical therapy to increase muscle strength around the joint and flexibility at the joint. This may also involve learning proper body positions and ways of moving to relieve stress on the joint.  Direct manipulation of the SI joint.  Injections of steroid medicine into the joint to reduce pain and swelling.  Radiofrequency ablation to burn away nerves that are carrying pain messages from the joint.  Use of a device that provides electrical stimulation to help reduce pain at the joint.  Surgery to put in screws and plates that limit or prevent joint motion. This is rare. Follow these instructions at home: Medicines  Take over-the-counter and prescription medicines only as told by your health care provider.  Do not drive or use heavy machinery while taking prescription pain medicine.  If you are taking prescription pain medicine,  take actions to prevent or treat constipation. Your health care provider may recommend that you: ? Drink enough fluid to keep your urine pale yellow. ? Eat foods that are high in fiber, such as fresh fruits and vegetables, whole grains, and beans. ? Limit foods that are high in fat and processed sugars, such as fried or sweet foods. ? Take an over-the-counter or prescription medicine for constipation. If you have a brace:  Wear the brace as told by your health care provider. Remove it only as told by your health care provider.  Keep the brace clean.  If the brace is not waterproof: ? Do not let it get wet. ? Cover it with a watertight covering when you take a bath or a shower. Managing pain, stiffness, and swelling      Icing can help with pain and swelling. Heat may help with muscle tension or spasms. Ask your health care provider if you should use ice or heat.  If directed, put ice on the affected area: ? If you have a removable brace, remove it as told by your health care provider. ? Put ice in a plastic bag. ? Place a towel between your skin and the bag. ? Leave the ice on for 20 minutes, 2-3 times a day.  If directed, apply heat to the affected area. Use the heat source that your health care provider recommends, such as a moist heat pack or a heating pad. ? Place a towel between your skin and the heat source. ? Leave the heat on for 20-30 minutes. ? Remove the heat if your skin turns bright red. This is especially important if you are unable to feel pain, heat, or cold. You may have a greater risk of getting burned. General instructions  Rest as needed. Ask your health care provider what activities are safe for you.  Return to your normal activities as told by your health care provider.  Exercise as directed by your health care provider or physical therapist.  Do not use any products that contain nicotine or tobacco, such as cigarettes and e-cigarettes. These can delay  bone healing. If you need help quitting, ask your health care provider.  Keep all follow-up visits as told by your health care provider. This is important. Contact a health care provider if:  Your pain is not controlled with medicine.  You have a fever.  Your pain is getting worse. Get help right away if:  You have weakness, numbness, or tingling in your legs or feet.  You lose control of your bladder or bowel. Summary  Sacroiliac joint dysfunction is a condition that causes inflammation on one or both sides of the sacroiliac (SI) joint.  This condition causes deep aching or burning pain in the low back. In some cases, the pain may also spread into one or both buttocks, hips, or thighs.  Treatment depends on the cause and severity of your condition. It may include medicines to reduce pain and swelling or to relax muscles. This information is not intended to replace advice given to you  by your health care provider. Make sure you discuss any questions you have with your health care provider. Document Revised: 04/05/2018 Document Reviewed: 09/19/2017 Elsevier Patient Education  2020 [employer].

## 2020-03-04 NOTE — Progress Notes (Signed)
SUBJECTIVE:   CHIEF COMPLAINT / HPI:   LLE DVT Diagnosed on 06/2019 and treated with Eliquis, has been on this Mother had phlebitis 50 years ago and PE 5-6 years ago Mother has not been diagnosed with a blood disorder that she knows of, but she has been on a blood thinner she a PE Smokes currently, 0.5 ppd, quit 2 weeks ago for 2 weeks, but started up again in last few days  Bilateral Back pain More on L than R Also about 1 month ago she thinks she had hematuria that went away after a few days Pain has been in lower back bilaterally a few days, today started to have lower abdominal pain which is bilateral No pain with urination Pain is constant No fevers or chills No periods with IUD No changes in vaginal discharge No prior kidney stones Hurts to sit and stan, back pain is 6-7 Abdomen is 5 Has been sitting with a heating pad  Doesn't radiate from the back to the abdomen  PERTINENT  PMH / PSH: History of DVT, left OSA, hyperlipidemia, tobacco abuse  OBJECTIVE:   BP 112/78   Pulse 95   Ht 5\' 5"  (1.651 m)   Wt 209 lb 12.8 oz (95.2 kg)   SpO2 96%   BMI 34.91 kg/m    Physical Exam:  General: 44 y.o. female in NAD Cardio: RRR no m/r/g Lungs: CTAB, no wheezing, no rhonchi, no crackles, no IWOB on RA Abdomen: Soft, non-tender to palpation, non-distended, positive bowel sounds Skin: warm and dry Extremities: No edema  Back Exam:  Inspection: No gross deformities, overlying erythema, ecchymosis Palpation: No tenderness to palpation of spinous processes in thoracic or lumbar spine, no step-off noted, tenderness to palpation at left SI joint, mild tenderness to palpation at right SI joint ROM: Full range of motion of bilateral lower extremities in all fields Special Tests: Pain at left SI joint with FABER, negative FABER on right.  Negative FADIR bilaterally. Strength: 5/5 strength bilateral lower extremities in all fields Neurovascular: Neurovascularly intact distally  bilaterally    Results for orders placed or performed in visit on 03/04/20 (from the past 24 hour(s))  POCT urinalysis dipstick     Status: Abnormal   Collection Time: 03/04/20  4:35 PM  Result Value Ref Range   Color, UA yellow yellow   Clarity, UA clear clear   Glucose, UA negative negative mg/dL   Bilirubin, UA negative negative   Ketones, POC UA negative negative mg/dL   Spec Grav, UA 03/06/20 9.562 - 1.025   Blood, UA small (A) negative   pH, UA 6.5 5.0 - 8.0   Protein Ur, POC negative negative mg/dL   Urobilinogen, UA 0.2 0.2 or 1.0 E.U./dL   Nitrite, UA Negative Negative   Leukocytes, UA Negative Negative  POCT UA - Microscopic Only     Status: None   Collection Time: 03/04/20  4:35 PM  Result Value Ref Range   WBC, Ur, HPF, POC NONE    RBC, urine, microscopic 0-2    Bacteria, U Microscopic TRACE    Epithelial cells, urine per micros RARE      ASSESSMENT/PLAN:   History of DVT (deep vein thrombosis) Presumably an unprovoked DVT.  Patient does have risk factors including obesity, current smoking use, possible family history although it is unclear if she has a family history of true medical diagnosis.  Discussed with patient and Dr. 03/06/20 that there is not a clear answer at this time  as to whether or not patient should be on anticoagulation.  Currently, it is felt that the risks of clot outweigh the risks of anticoagulation and she would have greater benefit from remaining on anticoagulation especially while she remains in the obese category and continues to smoke.  Joint decision making made with patient and she we will continue Eliquis 5 mg twice daily.  This should be assessed at least yearly to ensure that benefits of anticoagulation still outweigh risks.  Advised patient that she is not to remain on this for a lifetime, but to remain on it yearly and reassess.  Also ensured that patient is up-to-date on age-appropriate cancer screenings.  Mammogram ordered.  Encounter for  screening mammogram for malignant neoplasm of breast Mammogram ordered for screening.  Acute back pain Patient's UA negative, did have a small amount of blood.  RBCs 0-2 on microscopy.  Given possible symptoms, will order urine culture as well.  Will opt to not treat for UTI at this time, as it appears her back pain is actually more SI related given the pain at her left SI joint, tenderness palpation, positive FABER on left.  Patient declines physical therapy at this time, states that she just wanted to make sure that she did not have any urine or kidney infection.  Advised ice and Tylenol, do not use NSAIDs given Eliquis use.  Follow-up in 4 weeks if no improvement, could consider referral to physical therapy at that time.  Could also consider trigger point injection although no great evidence to support this.     Unknown Jim, DO [facility]

## 2020-03-05 ENCOUNTER — Other Ambulatory Visit: Payer: Self-pay | Admitting: Family Medicine

## 2020-03-05 DIAGNOSIS — Z1231 Encounter for screening mammogram for malignant neoplasm of breast: Secondary | ICD-10-CM

## 2020-03-05 DIAGNOSIS — Z86718 Personal history of other venous thrombosis and embolism: Secondary | ICD-10-CM | POA: Insufficient documentation

## 2020-03-05 DIAGNOSIS — Z113 Encounter for screening for infections with a predominantly sexual mode of transmission: Secondary | ICD-10-CM | POA: Insufficient documentation

## 2020-03-05 DIAGNOSIS — M549 Dorsalgia, unspecified: Secondary | ICD-10-CM | POA: Insufficient documentation

## 2020-03-05 NOTE — Assessment & Plan Note (Signed)
Mammogram ordered for screening

## 2020-03-05 NOTE — Assessment & Plan Note (Signed)
Patient's UA negative, did have a small amount of blood.  RBCs 0-2 on microscopy.  Given possible symptoms, will order urine culture as well.  Will opt to not treat for UTI at this time, as it appears her back pain is actually more SI related given the pain at her left SI joint, tenderness palpation, positive FABER on left.  Patient declines physical therapy at this time, states that she just wanted to make sure that she did not have any urine or kidney infection.  Advised ice and Tylenol, do not use NSAIDs given Eliquis use.  Follow-up in 4 weeks if no improvement, could consider referral to physical therapy at that time.  Could also consider trigger point injection although no great evidence to support this.

## 2020-03-05 NOTE — Assessment & Plan Note (Addendum)
Presumably an unprovoked DVT.  Patient does have risk factors including obesity, current smoking use, possible family history although it is unclear if she has a family history of true medical diagnosis.  Discussed with patient and Dr. Manson Passey that there is not a clear answer at this time as to whether or not patient should be on anticoagulation.  Currently, it is felt that the risks of clot outweigh the risks of anticoagulation and she would have greater benefit from remaining on anticoagulation especially while she remains in the obese category and continues to smoke.  Joint decision making made with patient and she we will continue Eliquis 5 mg twice daily.  This should be assessed at least yearly to ensure that benefits of anticoagulation still outweigh risks.  Advised patient that she is not to remain on this for a lifetime, but to remain on it yearly and reassess.  Also ensured that patient is up-to-date on age-appropriate cancer screenings.  Mammogram ordered.

## 2020-03-06 LAB — URINE CULTURE: Organism ID, Bacteria: NO GROWTH

## 2020-03-11 ENCOUNTER — Ambulatory Visit
Admission: RE | Admit: 2020-03-11 | Discharge: 2020-03-11 | Disposition: A | Discharge status: Home / Self Care | Payer: No Typology Code available for payment source | Source: Ambulatory Visit | Attending: Family Medicine | Admitting: Family Medicine

## 2020-03-11 ENCOUNTER — Other Ambulatory Visit: Payer: Self-pay

## 2020-03-11 DIAGNOSIS — Z1231 Encounter for screening mammogram for malignant neoplasm of breast: Secondary | ICD-10-CM

## 2020-03-13 ENCOUNTER — Encounter: Payer: Self-pay | Admitting: Family Medicine

## 2020-03-21 ENCOUNTER — Other Ambulatory Visit: Payer: Self-pay | Admitting: Family Medicine

## 2020-03-21 DIAGNOSIS — I82442 Acute embolism and thrombosis of left tibial vein: Secondary | ICD-10-CM

## 2020-03-24 ENCOUNTER — Ambulatory Visit (INDEPENDENT_AMBULATORY_CARE_PROVIDER_SITE_OTHER): Payer: No Typology Code available for payment source | Admitting: Family Medicine

## 2020-03-24 ENCOUNTER — Other Ambulatory Visit: Payer: Self-pay

## 2020-03-24 ENCOUNTER — Other Ambulatory Visit: Payer: Self-pay | Admitting: Family Medicine

## 2020-03-24 ENCOUNTER — Other Ambulatory Visit (HOSPITAL_COMMUNITY)
Admission: RE | Admit: 2020-03-24 | Discharge: 2020-03-24 | Disposition: A | Discharge status: Home / Self Care | Payer: No Typology Code available for payment source | Source: Ambulatory Visit | Attending: Family Medicine | Admitting: Family Medicine

## 2020-03-24 VITALS — BP 120/64 | HR 100 | Wt 204.0 lb

## 2020-03-24 DIAGNOSIS — B9689 Other specified bacterial agents as the cause of diseases classified elsewhere: Secondary | ICD-10-CM

## 2020-03-24 DIAGNOSIS — Z113 Encounter for screening for infections with a predominantly sexual mode of transmission: Secondary | ICD-10-CM

## 2020-03-24 DIAGNOSIS — M549 Dorsalgia, unspecified: Secondary | ICD-10-CM | POA: Diagnosis not present

## 2020-03-24 DIAGNOSIS — R319 Hematuria, unspecified: Secondary | ICD-10-CM | POA: Diagnosis not present

## 2020-03-24 DIAGNOSIS — N760 Acute vaginitis: Secondary | ICD-10-CM

## 2020-03-24 LAB — POCT URINALYSIS DIP (MANUAL ENTRY)
Bilirubin, UA: NEGATIVE
Blood, UA: NEGATIVE
Glucose, UA: NEGATIVE mg/dL
Ketones, POC UA: NEGATIVE mg/dL
Nitrite, UA: NEGATIVE
Protein Ur, POC: NEGATIVE mg/dL
Spec Grav, UA: 1.015 (ref 1.010–1.025)
Urobilinogen, UA: 0.2 E.U./dL
pH, UA: 7.5 (ref 5.0–8.0)

## 2020-03-24 LAB — POCT WET PREP (WET MOUNT)
Clue Cells Wet Prep Whiff POC: POSITIVE
Trichomonas Wet Prep HPF POC: ABSENT

## 2020-03-24 MED ORDER — METRONIDAZOLE 500 MG PO TABS: 500.0000 mg | ORAL_TABLET | Freq: Two times a day (BID) | ORAL | 0 refills | Status: AC

## 2020-03-24 NOTE — Assessment & Plan Note (Addendum)
UA today with +Leukocytesterase. Will obtain urine culture.  Wet prep significant for BV.   - Follow up urine culture

## 2020-03-24 NOTE — Progress Notes (Signed)
   SUBJECTIVE:   CHIEF COMPLAINT / HPI:   Chief Complaint  Patient presents with  . Hematuria     Monica Sandoval is a 44 y.o. female here for hematuria follow up.    Patient denies dysuria. States she sees pink when she wipes. No LMP recorded (lmp unknown). (Menstrual status: IUD). Has not felt the strings of her IUD recently though she does not regularly check.  Has been married for 20+ years but her husband has other partners.  She wants to be checked for STIs. Denies any new partners.  - Medications tried: none - Sexually active with 1 female partner (husband)  - Last sexual encounter: 03/22/20 unprotected with husband - Contraception: IUD  - Abnormal vaginal discharge: no - Fever: no - Abdominal/Pelvic pain: no - low back pain: yes  - Vaginal bleeding: ? Pink when she wipes  - Pain during sex: no - Rash: no     PERTINENT  PMH / PSH: reviewed and updated as appropriate   OBJECTIVE:   BP (!) 120/64   Pulse 100   Wt (!) 204 lb (92.5 kg)   LMP  (LMP Unknown)   SpO2 96%   BMI 33.95 kg/m   GEN: well appearing female in no acute distress  CVS: well perfused  RESP: speaking in full sentences without pause  ABD: soft, non-tender, non-distended, no palpable masses  BACK: bilateral paraspinal tenderness, no midline tenderness  Pelvic exam: normal external genitalia, vulva, VAGINA and CERVIX: erythematous cervix, cervical discharge present - white and copious, DNA probe for chlamydia and GC obtained, cervical motion tenderness absent, ADNEXA: normal adnexa in size, nontender and no masses, WET MOUNT done - results: KOH done, clue cells, excessive bacteria, positive Whiff test, exam chaperoned by CMA.     ASSESSMENT/PLAN:   Routine screening for STI (sexually transmitted infection) HIV, RPR, GC, CT obtained.  Wet prep negative for trichomoniasis.   - Follow up on pending tests   Back pain without radiation UA today with +Leukocytesterase. Will obtain urine culture.   Wet prep significant for BV.   - Follow up urine culture   BV (bacterial vaginosis) Confirmed on wet prep.  - Treatment: Flagyl 500 BID x 7 days and abstain from coitus during course of treatment. Advised patient to not drink alcohol while taking this medication.  - F/U if symptoms not improving or getting worse.  - Will f/u on G/C Chlamydia and call in Rx if positive.  - Self care instructions given including avoiding douching. Handout given.  - F/U with PCP as needed.  - Return precautions including abdominal pain, fever, chills, nausea, or vomiting given.   High risk heterosexual behavior GC and chlamydia DNA  probe sent to lab. HIV and RPR collected. Advised patient to use barrier protection/condoms.           Katha Cabal, DO PGY-2, [facility] 03/24/2020

## 2020-03-24 NOTE — Patient Instructions (Addendum)
It was great seeing you today. Please check-out at the front desk before leaving the clinic. I'd like to see you back if your symptoms do not improve.    Regarding lab work today:  Due to recent changes in healthcare laws, you may see the results of your imaging and laboratory studies on MyChart before your provider has had a chance to review them.  I understand that in some cases there may be results that are confusing or concerning to you. Not all laboratory results come back in the same time frame and you may be waiting for multiple results in order to interpret others.  Please give [name] 72 hours in order for your provider to thoroughly review all the results before contacting the office for clarification of your results. If everything is normal, you will get a letter in the mail or a message in My Chart. Please give [name] a call if you do not hear from [name] after 2 weeks.  Please bring all of your medications with you to each visit.    If you haven't already, sign up for My Chart to have easy access to your labs results, and communication with your primary care physician.  Feel free to call with any questions or concerns at any time, at [phone].   Take care,  Dr. Katherina Right [facility]

## 2020-03-24 NOTE — Progress Notes (Signed)
Called patient to inform her of results. Rx Flagyl sent to pharmacy.

## 2020-03-24 NOTE — Assessment & Plan Note (Addendum)
HIV, RPR, GC, CT obtained.  Wet prep negative for trichomoniasis.   - Follow up on pending tests

## 2020-03-25 ENCOUNTER — Encounter: Payer: Self-pay | Admitting: Family Medicine

## 2020-03-25 LAB — HIV ANTIBODY (ROUTINE TESTING W REFLEX): HIV Screen 4th Generation wRfx: NONREACTIVE

## 2020-03-25 LAB — CERVICOVAGINAL ANCILLARY ONLY
Chlamydia: NEGATIVE
Comment: NEGATIVE
Comment: NORMAL
Neisseria Gonorrhea: NEGATIVE

## 2020-03-25 LAB — RPR: RPR Ser Ql: NONREACTIVE

## 2020-03-26 ENCOUNTER — Encounter: Payer: Self-pay | Admitting: Family Medicine

## 2020-03-26 DIAGNOSIS — B9689 Other specified bacterial agents as the cause of diseases classified elsewhere: Secondary | ICD-10-CM

## 2020-03-26 DIAGNOSIS — N760 Acute vaginitis: Secondary | ICD-10-CM | POA: Insufficient documentation

## 2020-03-26 HISTORY — DX: Other specified bacterial agents as the cause of diseases classified elsewhere: B96.89

## 2020-03-26 HISTORY — DX: Acute vaginitis: N76.0

## 2020-03-26 LAB — URINE CULTURE

## 2020-03-26 NOTE — Assessment & Plan Note (Addendum)
Confirmed on wet prep.  - Treatment: Flagyl 500 BID x 7 days and abstain from coitus during course of treatment. Advised patient to not drink alcohol while taking this medication.  - F/U if symptoms not improving or getting worse.  - Will f/u on G/C Chlamydia and call in Rx if positive.  - Self care instructions given including avoiding douching. Handout given.  - F/U with PCP as needed.  - Return precautions including abdominal pain, fever, chills, nausea, or vomiting given.   High risk heterosexual behavior GC and chlamydia DNA  probe sent to lab. HIV and RPR collected. Advised patient to use barrier protection/condoms.

## 2020-05-22 ENCOUNTER — Other Ambulatory Visit: Payer: Self-pay | Admitting: *Deleted

## 2020-05-22 DIAGNOSIS — J440 Chronic obstructive pulmonary disease with acute lower respiratory infection: Secondary | ICD-10-CM

## 2020-05-22 MED ORDER — ALBUTEROL SULFATE HFA 108 (90 BASE) MCG/ACT IN AERS: INHALATION_SPRAY | RESPIRATORY_TRACT | 2 refills | Status: DC

## 2020-08-25 ENCOUNTER — Other Ambulatory Visit: Payer: Self-pay | Admitting: Family Medicine

## 2020-08-25 DIAGNOSIS — J440 Chronic obstructive pulmonary disease with acute lower respiratory infection: Secondary | ICD-10-CM

## 2020-09-02 ENCOUNTER — Other Ambulatory Visit: Payer: Self-pay | Admitting: Family Medicine

## 2020-09-02 DIAGNOSIS — I82442 Acute embolism and thrombosis of left tibial vein: Secondary | ICD-10-CM

## 2021-01-19 NOTE — Progress Notes (Deleted)
Cardiology Office Note:    Date:  01/19/2021   ID:  Monica Sandoval, DOB 11-17-1975, MRN [id number]  PCP:  Unknown Jim, DO  Cardiologist:  None  Electrophysiologist:  None   Referring MD: Unknown Jim, *   No chief complaint on file.   History of Present Illness:    Monica Sandoval is a 45 y.o. female with a hx of DVT, hyperlipidemia, tobacco use, asthma, hiatal hernia who presents for follow-up.  She was referred by Dr. Manson Passey for evaluation of chest pain, initial visit on 07/31/2019.  Presented to ED on 07/09/2019 with atypical chest pain and left lower extremity pain.  Found to have age-indeterminate DVT of left posterior tibial veins and left peroneal veins.  Had been having leg pain x 2 months.  CTPA was negative for PE.  Started on Eliquis.  Reported that she had been having chest pain daily.  Describes pain as sometimes right-sided, but can be left-sided.  Describes as dull aching pain, 5 out of 10 in intensity.  Typically last for 30 minutes or so and resolves.  No clear relationship with exertion, can occur at rest.  Smoked 1ppd x 20 years.   Father died of MI at age 68.      Coronary CT was ordered but her insurance would not cover.  ETT on 10/30/19 showed no evidence of ischemia.  TTE on 10/31/2019 showed normal biventricular function, no significant valvular disease.  Since last clinic visit,   she reports that she continues to have intermittent chest pain.  However recently went on a long hike and also plays tennis occasionally.  Has not noted any exertional chest pain.  Has attributed her chest pain to known hiatal hernia.  States that she has been under stress at work and has increased her tobacco use from half a pack to 1 pack/day.  She continues to take Eliquis, states that she was told by her PCP would likely remain on it long-term given her family history of blood clots.  Main complaint today is pain in bilateral calves.  States that she notices it when  she has been standing at work for a while.    Past Medical History:  Diagnosis Date  . Asthma   . 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 CHOLECYSTECTOMY  2008    Current Medications: No outpatient medications have been marked as taking for the 01/21/21 encounter (Appointment) with Little Ishikawa, MD.     Allergies:   Patient has no known allergies.   Social History   Socioeconomic History  . Marital status: Married    Spouse name: Not on file  . Number of children: Not on file  . Years of education: Not on file  . Highest education level: Not on file  Occupational History  . Not on file  Tobacco Use  . Smoking status: Current Every Day Smoker    Packs/day: 0.50    Years: 16.00    Pack years: 8.00    Types: Cigarettes    Start date: 2005  . Smokeless tobacco: Never Used  . Tobacco comment: Sts she quit 09/12/16  Vaping Use  . Vaping Use: Some days  Substance and Sexual Activity  . Alcohol use: No  . Drug use: No  . Sexual activity: Yes    Birth control/protection: I.U.D.  Other Topics Concern  .  Not on file  Social History Narrative  . Not on file   Social Determinants of Health   Financial Resource Strain: Not on file  Food Insecurity: Not on file  Transportation Needs: Not on file  Physical Activity: Not on file  Stress: Not on file  Social Connections: Not on file     Family History: The patient's family history includes Arthritis in her mother; Asthma in her mother; Cancer in her paternal grandmother; Diabetes in her father and paternal grandmother; Early death in her father; Heart disease in her father and paternal grandmother; Rheum arthritis in her mother.  ROS:   Please see the history of present illness.     All other systems reviewed and are negative.  EKGs/Labs/Other Studies Reviewed:    The following studies were reviewed  today:   EKG:  EKG is not  ordered today.  The last ekg ordered demonstrates normal sinus rhythm, rate 70, no ST/T abnormalities  Recent Labs: No results found for requested labs within last 8760 hours.  Recent Lipid Panel    Component Value Date/Time   CHOL 183 10/02/2018 1624   TRIG 102 10/02/2018 1624   HDL 49 10/02/2018 1624   CHOLHDL 3.7 10/02/2018 1624   CHOLHDL 3.3 11/12/2011 1659   VLDL 17 11/12/2011 1659   LDLCALC 114 (H) 10/02/2018 1624    Physical Exam:    VS:  There were no vitals taken for this visit.    Wt Readings from Last 3 Encounters:  03/24/20 (!) 204 lb (92.5 kg)  03/04/20 209 lb 12.8 oz (95.2 kg)  01/08/20 207 lb (93.9 kg)     GEN:  Well nourished, well developed in no acute distress HEENT: Normal NECK: No JVD LYMPHATICS: No lymphadenopathy CARDIAC: RRR, no murmurs, rubs, gallops RESPIRATORY:  Clear to auscultation without rales, wheezing or rhonchi  ABDOMEN: Soft, non-tender, non-distended MUSCULOSKELETAL:  No edema; No deformity  SKIN: Warm and dry NEUROLOGIC:  Alert and oriented x 3 PSYCHIATRIC:  Normal affect   ASSESSMENT:    No diagnosis found. PLAN:    Chest pain: Atypical in description, but does have significant risk factors for coronary artery disease (tobacco use, hyperlipidemia, family history).  Coronary CTA was ordered but denied by her insurance.  ETT on 10/30/19 showed no evidence of ischemia.  TTE on 10/31/2019 showed normal biventricular function, no significant valvular disease. -No further cardiac work-up recommended  Leg pain: Normal ABIs on 01/29/2020  LLE DVT: Follows with PCP, on Eliquis 5 mg twice daily.  States that she was told may need to continue long-term given her family history of blood clots  Tobacco use: Smoking 1 pack/day.  Patient was counseled on the risks of tobacco use and cessation strongly encouraged  Hyperlipidemia: on rosuvastatin 10 mg daily.  LDL 114 on 10/02/18.    RTC in***  Medication  Adjustments/Labs and Tests Ordered: Current medicines are reviewed at length with the patient today.  Concerns regarding medicines are outlined above.  No orders of the defined types were placed in this encounter.  No orders of the defined types were placed in this encounter.   There are no Patient Instructions on file for this visit.   Signed, Little Ishikawa, MD  01/19/2021 11:53 AM    [facility]

## 2021-01-20 ENCOUNTER — Other Ambulatory Visit: Payer: Self-pay | Admitting: Family Medicine

## 2021-01-20 NOTE — Telephone Encounter (Signed)
Needs appointment in July

## 2021-01-21 ENCOUNTER — Encounter: Payer: Self-pay | Admitting: Cardiology

## 2021-01-21 ENCOUNTER — Other Ambulatory Visit: Payer: Self-pay

## 2021-01-21 ENCOUNTER — Ambulatory Visit (INDEPENDENT_AMBULATORY_CARE_PROVIDER_SITE_OTHER): Payer: No Typology Code available for payment source | Admitting: Cardiology

## 2021-01-21 VITALS — BP 128/70 | HR 98 | Ht 64.0 in | Wt 207.0 lb

## 2021-01-21 DIAGNOSIS — R079 Chest pain, unspecified: Secondary | ICD-10-CM | POA: Diagnosis not present

## 2021-01-21 DIAGNOSIS — I824Z2 Acute embolism and thrombosis of unspecified deep veins of left distal lower extremity: Secondary | ICD-10-CM | POA: Diagnosis not present

## 2021-01-21 DIAGNOSIS — E785 Hyperlipidemia, unspecified: Secondary | ICD-10-CM

## 2021-01-21 DIAGNOSIS — Z720 Tobacco use: Secondary | ICD-10-CM | POA: Diagnosis not present

## 2021-01-21 MED ORDER — METOPROLOL TARTRATE 100 MG PO TABS
ORAL_TABLET | ORAL | 0 refills | Status: DC
Start: 2021-01-21 — End: 2022-08-25

## 2021-01-21 NOTE — Patient Instructions (Addendum)
Medication Instructions:  Your physician recommends that you continue on your current medications as directed. Please refer to the Current Medication list given to you today.  *If you need a refill on your cardiac medications before your next appointment, please call your pharmacy*   Lab Work: Once CCTA is scheduled, please have lab work completed. (BMET, CBC)  Our in office lab hours are Monday-Friday 8:00-4:00, closed for lunch 12:45-1:45 pm.  No appointment needed.  Testing/Procedures: Coronary CTA-see instructions below  Follow-Up: At [facility], you and your health needs are our priority.  As part of our continuing mission to provide you with exceptional heart care, we have created designated Provider Care Teams.  These Care Teams include your primary Cardiologist (physician) and Advanced Practice Providers (APPs -  Physician Assistants and Nurse Practitioners) who all work together to provide you with the care you need, when you need it.  We recommend signing up for the patient portal called "MyChart".  Sign up information is provided on this After Visit Summary.  MyChart is used to connect with patients for Virtual Visits (Telemedicine).  Patients are able to view lab/test results, encounter notes, upcoming appointments, etc.  Non-urgent messages can be sent to your provider as well.   To learn more about what you can do with MyChart, go to [email or web address].    Your next appointment:   6 month(s)  The format for your next appointment:   In Person  Provider:   Epifanio Lesches, MD   Other Instructions You have been referred to our careguide to assist with smoking cessation    Your cardiac CT will be scheduled at one of the below locations:   [facility] [address] [place], [place] [address] [phone]  If scheduled at [facility], please arrive at the [facility] main entrance (entrance A) of [facility] 30 minutes  prior to test start time. Proceed to the [facility] Radiology Department (first floor) to check-in and test prep.  Please follow these instructions carefully (unless otherwise directed):  On the Night Before the Test: . Be sure to Drink plenty of water. . Do not consume any caffeinated/decaffeinated beverages or chocolate 12 hours prior to your test. . Do not take any antihistamines 12 hours prior to your test.  On the Day of the Test: . Drink plenty of water until 1 hour prior to the test. . Do not eat any food 4 hours prior to the test. . You may take your regular medications prior to the test.  . Take metoprolol (Lopressor) two hours prior to test. . FEMALES- please wear underwire-free bra if available      After the Test: . Drink plenty of water. . After receiving IV contrast, you may experience a mild flushed feeling. This is normal. . On occasion, you may experience a mild rash up to 24 hours after the test. This is not dangerous. If this occurs, you can take Benadryl 25 mg and increase your fluid intake. . If you experience trouble breathing, this can be serious. If it is severe call 911 IMMEDIATELY. If it is mild, please call our office. . If you take any of these medications: Glipizide/Metformin, Avandament, Glucavance, please do not take 48 hours after completing test unless otherwise instructed.   Once we have confirmed authorization from your insurance company, we will call you to set up a date and time for your test. Based on how quickly your insurance processes prior authorizations requests, please  allow up to 4 weeks to be contacted for scheduling your Cardiac CT appointment. Be advised that routine Cardiac CT appointments could be scheduled as many as 8 weeks after your provider has ordered it.  For non-scheduling related questions, please contact the cardiac imaging nurse navigator should you have any questions/concerns: Rockwell Alexandria, Cardiac Imaging Nurse  Navigator Larey Brick, Cardiac Imaging Nurse Navigator [facility] Direct Office Dial: [phone]   For scheduling needs, including cancellations and rescheduling, please call [place], [phone].

## 2021-01-21 NOTE — Progress Notes (Signed)
Cardiology Office Note:    Date:  01/21/2021   ID:  Iran Ouch, DOB [birth year], MRN [id number]  PCP:  Unknown Jim, DO  Cardiologist:  None  Electrophysiologist:  None   Referring MD: Unknown Jim, *   Chief Complaint  Patient presents with  . Chest Pain    History of Present Illness:    Monica Sandoval is a 45 y.o. female with a hx of DVT, hyperlipidemia, tobacco use, asthma, hiatal hernia who presents for follow-up.  She was referred by Dr. Manson Passey for evaluation of chest pain, initial visit on 07/31/2019.  Presented to ED on 07/09/2019 with atypical chest pain and left lower extremity pain.  Found to have age-indeterminate DVT of left posterior tibial veins and left peroneal veins.  Had been having leg pain x 2 months.  CTPA was negative for PE.  Started on Eliquis.  Reported that she had been having chest pain daily.  Describes pain as sometimes right-sided, but can be left-sided.  Describes as dull aching pain, 5 out of 10 in intensity.  Typically last for 30 minutes or so and resolves.  No clear relationship with exertion, can occur at rest.  Smoked 1ppd x 20 years.   Father died of MI at age 3.      Coronary CT was ordered but her insurance would not cover.  ETT on 10/30/19 showed no evidence of ischemia.  TTE on 10/31/2019 showed normal biventricular function, no significant valvular disease.  Since last clinic visit, she has been feeling okay overall. However, she continues to have intermittent chest pain, localized in her upper central chest. She describes it as a spasm/tightness or that "her chest is pulling". This occurs randomly about once a week, with initial onset 1 month ago.  The chest pain is typically at night while resting, and lasts about 30 seconds. She states it may be related to stress. Of note, this chest pain is different than the pain she had at her last visit. 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 By the end of  the day she occasionally has LE edema. At home, she paints and does yard work. She does get short of breath with this exertion, but does not experience the chest pains. She states she has nearly doubled her smoking since last year, over 1 ppd. She denies any palpitations, headaches, lightheadedness, syncope, orthopnea or PND. There are no issues with hematuria or blood in her stool.   Past Medical History:  Diagnosis Date  . Asthma   . 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 CHOLECYSTECTOMY  2008    Current Medications: Current Meds  Medication Sig  . albuterol (VENTOLIN HFA) 108 (90 Base) MCG/ACT inhaler INHALE 2 PUFFS BY MOUTH EVERY 4 HOURS AS NEEDED FOR WHEEZING  . ELIQUIS 5 MG TABS tablet TAKE 1 TABLET BY MOUTH TWICE A DAY  . esomeprazole (NEXIUM) 40 MG capsule Take 40 mg by mouth daily at 12 noon.  . metoprolol tartrate (LOPRESSOR) 100 MG tablet Take 100 mg (1 tablet) 2 hours prior to CT scan.  . rosuvastatin (CRESTOR) 10 MG tablet TAKE 1 TABLET BY MOUTH EVERY DAY     Allergies:   Patient has no known allergies.   Social History   Socioeconomic History  . Marital status: Married    Spouse name: Not  on file  . Number of children: Not on file  . Years of education: Not on file  . Highest education level: Not on file  Occupational History  . Not on file  Tobacco Use  . Smoking status: Current Every Day Smoker    Packs/day: 0.50    Years: 16.00    Pack years: 8.00    Types: Cigarettes    Start date: 2005  . Smokeless tobacco: Never Used  . Tobacco comment: Sts she quit 09/12/16  Vaping Use  . Vaping Use: Some days  Substance and Sexual Activity  . Alcohol use: No  . Drug use: No  . Sexual activity: Yes    Birth control/protection: I.U.D.  Other Topics Concern  . Not on file  Social History Narrative  . Not on file   Social Determinants of Health   Financial  Resource Strain: Not on file  Food Insecurity: Not on file  Transportation Needs: Not on file  Physical Activity: Not on file  Stress: Not on file  Social Connections: Not on file     Family History: The patient's family history includes Arthritis in her mother; Asthma in her mother; Cancer in her paternal grandmother; Diabetes in her father and paternal grandmother; Early death in her father; Heart disease in her father and paternal grandmother; Rheum arthritis in her mother.  ROS:   Please see the history of present illness.    (+) Upper central chest pain, spasmic/tightness (+) Shortness of breath (+) LE edema (+) Stress (+) Hiatal hernia All other systems reviewed and are negative.  EKGs/Labs/Other Studies Reviewed:    The following studies were reviewed today: LE Doppler 01/29/2020: Right: Resting right ankle-brachial index is within normal range.  No evidence of significant right lower extremity arterial disease. The  right toe-brachial index is normal.   Left: Resting left ankle-brachial index is within normal range.  No evidence of significant left lower extremity arterial disease. The left  toe-brachial index is normal.   Echo 10/31/2019: 1. Left ventricular ejection fraction, by estimation, is 60 to 65%. The  left ventricle has normal function. The left ventricle has no regional  wall motion abnormalities. Left ventricular diastolic parameters were  normal.  2. Right ventricular systolic function is normal. The right ventricular  size is normal.  3. The mitral valve is normal in structure. Trivial mitral valve  regurgitation. No evidence of mitral stenosis.  4. The aortic valve is normal in structure. Aortic valve regurgitation is  not visualized. No aortic stenosis is present.   ETT 10/30/2019:  Blood pressure demonstrated a normal response to exercise.  Upsloping ST segment depression ST segment depression was noted during stress in the II, III, aVF, V4, V5  and V6 leads.  Negative, adequate stress test.  US LE Venous 07/09/2019: Right: No evidence of common femoral vein obstruction.  Left: Findings consistent with age indeterminate deep vein thrombosis  involving the left posterior tibial veins, and left peroneal veins.   EKG:   01/21/2021: Sinus rhythm, Rate 98, No ST abnormalities 01/08/2020: EKG was not ordered. 10/08/2019: EKG was not ordered. 07/31/2019: normal sinus rhythm, rate 70, no ST/T abnormalities  Recent Labs: No results found for requested labs within last 8760 hours.  Recent Lipid Panel    Component Value Date/Time   CHOL 183 10/02/2018 1624   TRIG 102 10/02/2018 1624   HDL 49 10/02/2018 1624   CHOLHDL 3.7 10/02/2018 1624   CHOLHDL 3.3 11/12/2011 1659   VLDL 17 11/12/2011  1659   LDLCALC 114 (H) 10/02/2018 1624    Physical Exam:    VS:  BP 128/70   Pulse 98   Ht 5\' 4"  (1.626 m)   Wt 207 lb (93.9 kg)   SpO2 98%   BMI 35.53 kg/m     Wt Readings from Last 3 Encounters:  01/21/21 207 lb (93.9 kg)  03/24/20 (!) 204 lb (92.5 kg)  03/04/20 209 lb 12.8 oz (95.2 kg)     GEN:  Well nourished, well developed in no acute distress HEENT: Normal NECK: No JVD LYMPHATICS: No lymphadenopathy CARDIAC: RRR, no murmurs, rubs, gallops RESPIRATORY:  Clear to auscultation without rales, wheezing or rhonchi  ABDOMEN: Soft, non-tender, non-distended MUSCULOSKELETAL:  No edema; No deformity  SKIN: Warm and dry NEUROLOGIC:  Alert and oriented x 3 PSYCHIATRIC:  Normal affect   ASSESSMENT:    1. Chest pain of uncertain etiology   2. Hyperlipidemia, unspecified hyperlipidemia type   3. Tobacco use   4. Deep vein thrombosis (DVT) of distal vein of left lower extremity, unspecified chronicity (HCC)    PLAN:    Chest pain: Atypical in description, but does have significant risk factors for coronary artery disease (tobacco use, hyperlipidemia, family history).  ETT on 10/30/19 showed no evidence of ischemia.  TTE on 10/31/2019  showed normal biventricular function, no significant valvular disease. -Reports worsening chest pain, different in character than pain that occurred last year when she underwent ETT.  Recommend coronary CTA for further evaluation.  Will give metoprolol 100 mg prior to study.  Leg pain: Normal ABIs on 01/29/2020  LLE DVT: Follows with PCP, on Eliquis 5 mg twice daily.    Tobacco use:  Patient was counseled on the risks of tobacco use and cessation strongly encouraged.  Will ask care guide to reach out to patient to assist with cessation  Hyperlipidemia: on rosuvastatin 10 mg daily.  LDL 114 on 10/02/18.  Check lipid panel.  Follow-up results of coronary CTA as above to guide how aggressive to be in lowering cholesterol.  RTC in 6 months  Medication Adjustments/Labs and Tests Ordered: Current medicines are reviewed at length with the patient today.  Concerns regarding medicines are outlined above.  Orders Placed This Encounter  Procedures  . CT CORONARY MORPH W/CTA COR W/SCORE W/CA W/CM &/OR WO/CM  . Basic metabolic panel  . Lipid panel   Meds ordered this encounter  Medications  . metoprolol tartrate (LOPRESSOR) 100 MG tablet    Sig: Take 100 mg (1 tablet) 2 hours prior to CT scan.    Dispense:  1 tablet    Refill:  0    Patient Instructions  Medication Instructions:  Your physician recommends that you continue on your current medications as directed. Please refer to the Current Medication list given to you today.  *If you need a refill on your cardiac medications before your next appointment, please call your pharmacy*   Lab Work: Once CCTA is scheduled, please have lab work completed. (BMET, CBC)  Our in office lab hours are Monday-Friday 8:00-4:00, closed for lunch 12:45-1:45 pm.  No appointment needed.  Testing/Procedures: Coronary CTA-see instructions below  Follow-Up: At [facility], you and your health needs are our priority.  As part of our continuing mission to  provide you with exceptional heart care, we have created designated Provider Care Teams.  These Care Teams include your primary Cardiologist (physician) and Advanced Practice Providers (APPs -  Physician Assistants and Nurse Practitioners) who all work together  to provide you with the care you need, when you need it.  We recommend signing up for the patient portal called "MyChart".  Sign up information is provided on this After Visit Summary.  MyChart is used to connect with patients for Virtual Visits (Telemedicine).  Patients are able to view lab/test results, encounter notes, upcoming appointments, etc.  Non-urgent messages can be sent to your provider as well.   To learn more about what you can do with MyChart, go to [email or web address].    Your next appointment:   6 month(s)  The format for your next appointment:   In Person  Provider:   Epifanio Lesches, MD   Other Instructions You have been referred to our careguide to assist with smoking cessation    Your cardiac CT will be scheduled at one of the below locations:   [facility] [address] [place], [place] [address] [phone]  If scheduled at [facility], please arrive at the [facility] main entrance (entrance A) of [facility] 30 minutes prior to test start time. Proceed to the [facility] Radiology Department (first floor) to check-in and test prep.  Please follow these instructions carefully (unless otherwise directed):  On the Night Before the Test: . Be sure to Drink plenty of water. . Do not consume any caffeinated/decaffeinated beverages or chocolate 12 hours prior to your test. . Do not take any antihistamines 12 hours prior to your test.  On the Day of the Test: . Drink plenty of water until 1 hour prior to the test. . Do not eat any food 4 hours prior to the test. . You may take your regular medications prior to the test.  . Take metoprolol (Lopressor) two hours  prior to test. . FEMALES- please wear underwire-free bra if available      After the Test: . Drink plenty of water. . After receiving IV contrast, you may experience a mild flushed feeling. This is normal. . On occasion, you may experience a mild rash up to 24 hours after the test. This is not dangerous. If this occurs, you can take Benadryl 25 mg and increase your fluid intake. . If you experience trouble breathing, this can be serious. If it is severe call 911 IMMEDIATELY. If it is mild, please call our office. . If you take any of these medications: Glipizide/Metformin, Avandament, Glucavance, please do not take 48 hours after completing test unless otherwise instructed.   Once we have confirmed authorization from your insurance company, we will call you to set up a date and time for your test. Based on how quickly your insurance processes prior authorizations requests, please allow up to 4 weeks to be contacted for scheduling your Cardiac CT appointment. Be advised that routine Cardiac CT appointments could be scheduled as many as 8 weeks after your provider has ordered it.  For non-scheduling related questions, please contact the cardiac imaging nurse navigator should you have any questions/concerns: Rockwell Alexandria, Cardiac Imaging Nurse Navigator Larey Brick, Cardiac Imaging Nurse Navigator [facility] Direct Office Dial: [phone]   For scheduling needs, including cancellations and rescheduling, please call [place], [phone].      I,Mathew Stumpf,acting as a Neurosurgeon for Little Ishikawa, MD.,have documented all relevant documentation on the behalf of Little Ishikawa, MD,as directed by  Little Ishikawa, MD while in the presence of Little Ishikawa, MD.  I, Little Ishikawa, MD, have reviewed all documentation for this  visit. The documentation on 01/21/21 for the exam, diagnosis, procedures, and orders are all accurate  and complete.   Signed, Little Ishikawa, MD  01/21/2021 10:40 PM    [facility]

## 2021-01-21 NOTE — Telephone Encounter (Signed)
Called and left voicemail for patient to call back and schedule appointment.  

## 2021-01-23 ENCOUNTER — Telehealth: Payer: Self-pay

## 2021-01-23 DIAGNOSIS — Z0000 Encounter for general adult medical examination without abnormal findings: Secondary | ICD-10-CM

## 2021-01-23 NOTE — Telephone Encounter (Signed)
Called patient to discuss health coaching for smoking cessation. Left patient a message to return call to Care Guide at [phone].

## 2021-01-30 NOTE — Addendum Note (Signed)
Addended by: Myna Hidalgo A on: 01/30/2021 02:32 PM   Modules accepted: Orders

## 2021-02-04 LAB — BASIC METABOLIC PANEL
BUN/Creatinine Ratio: 11 (ref 9–23)
BUN: 9 mg/dL (ref 6–24)
CO2: 23 mmol/L (ref 20–29)
Calcium: 8.9 mg/dL (ref 8.7–10.2)
Chloride: 103 mmol/L (ref 96–106)
Creatinine, Ser: 0.85 mg/dL (ref 0.57–1.00)
Glucose: 104 mg/dL — ABNORMAL HIGH (ref 65–99)
Potassium: 4.1 mmol/L (ref 3.5–5.2)
Sodium: 140 mmol/L (ref 134–144)
eGFR: 86 mL/min/{1.73_m2} (ref >59)

## 2021-02-04 LAB — LIPID PANEL
Chol/HDL Ratio: 3.8 ratio (ref 0.0–4.4)
Cholesterol, Total: 183 mg/dL (ref 100–199)
HDL: 48 mg/dL (ref >39)
LDL Chol Calc (NIH): 118 mg/dL — ABNORMAL HIGH (ref 0–99)
Triglycerides: 94 mg/dL (ref 0–149)
VLDL Cholesterol Cal: 17 mg/dL (ref 5–40)

## 2021-02-06 ENCOUNTER — Telehealth (HOSPITAL_COMMUNITY): Payer: Self-pay | Admitting: Emergency Medicine

## 2021-02-06 NOTE — Telephone Encounter (Signed)
Reaching out to patient to offer assistance regarding upcoming cardiac imaging study; pt verbalizes understanding of appt date/time, parking situation and where to check in, pre-test NPO status and medications ordered, and verified current allergies; name and call back number provided for further questions should they arise °Yazlyn Wentzel RN Navigator Cardiac Imaging °[facility] °[phone] office °[phone] cell ° °100mg metoprolol tartrate 2 hr prior to scan °

## 2021-02-10 ENCOUNTER — Ambulatory Visit (HOSPITAL_COMMUNITY)
Admission: RE | Admit: 2021-02-10 | Discharge: 2021-02-10 | Disposition: A | Discharge status: Home / Self Care | Payer: No Typology Code available for payment source | Source: Ambulatory Visit | Attending: Cardiology | Admitting: Cardiology

## 2021-02-10 ENCOUNTER — Other Ambulatory Visit: Payer: Self-pay

## 2021-02-10 DIAGNOSIS — R079 Chest pain, unspecified: Secondary | ICD-10-CM | POA: Diagnosis present

## 2021-02-10 MED ORDER — NITROGLYCERIN 0.4 MG SL SUBL
SUBLINGUAL_TABLET | SUBLINGUAL | Status: AC
  Filled 2021-02-10: qty 2

## 2021-02-10 MED ORDER — NITROGLYCERIN 0.4 MG SL SUBL
0.8000 mg | SUBLINGUAL_TABLET | Freq: Once | SUBLINGUAL | Status: AC
  Administered 2021-02-10: 0.8 mg via SUBLINGUAL

## 2021-02-10 MED ORDER — IOHEXOL 350 MG/ML SOLN
80.0000 mL | Freq: Once | INTRAVENOUS | Status: AC | PRN
  Administered 2021-02-10: 80 mL via INTRAVENOUS

## 2021-02-27 ENCOUNTER — Other Ambulatory Visit: Payer: Self-pay | Admitting: Family Medicine

## 2021-02-27 DIAGNOSIS — J440 Chronic obstructive pulmonary disease with acute lower respiratory infection: Secondary | ICD-10-CM

## 2021-03-02 ENCOUNTER — Telehealth: Payer: Self-pay | Admitting: Physical Medicine and Rehabilitation

## 2021-03-02 NOTE — Telephone Encounter (Signed)
Pt calling to sched for inj with [facility]. Has not had an inj in 2 years or so and now the pain is back so would like an appt. The best call back number is [phone].

## 2021-03-06 ENCOUNTER — Other Ambulatory Visit: Payer: Self-pay | Admitting: Family Medicine

## 2021-03-06 DIAGNOSIS — I82442 Acute embolism and thrombosis of left tibial vein: Secondary | ICD-10-CM

## 2021-03-10 ENCOUNTER — Other Ambulatory Visit: Payer: Self-pay

## 2021-03-10 ENCOUNTER — Telehealth: Payer: Self-pay | Admitting: Physical Medicine and Rehabilitation

## 2021-03-10 ENCOUNTER — Encounter: Payer: Self-pay | Admitting: Physical Medicine and Rehabilitation

## 2021-03-10 ENCOUNTER — Ambulatory Visit (INDEPENDENT_AMBULATORY_CARE_PROVIDER_SITE_OTHER): Payer: No Typology Code available for payment source | Admitting: Physical Medicine and Rehabilitation

## 2021-03-10 VITALS — BP 117/71 | HR 91

## 2021-03-10 DIAGNOSIS — M4802 Spinal stenosis, cervical region: Secondary | ICD-10-CM | POA: Diagnosis not present

## 2021-03-10 DIAGNOSIS — M5412 Radiculopathy, cervical region: Secondary | ICD-10-CM

## 2021-03-10 DIAGNOSIS — M5010 Cervical disc disorder with radiculopathy, unspecified cervical region: Secondary | ICD-10-CM

## 2021-03-10 NOTE — Progress Notes (Signed)
Monica Sandoval - 45 y.o. female MRN [id number]  Date of birth: March 26, 1976  Office Visit Note: Visit Date: 03/10/2021 PCP: Maury Dus, MD Referred by: Maury Dus, MD  Subjective: Chief Complaint  Patient presents with   Neck - Pain   Right Shoulder - Pain   Left Shoulder - Pain   Left Arm - Numbness   Right Arm - Pain, Numbness   HPI: Monica Sandoval is a 45 y.o. female who comes in today for evaluation of chronic, worsening, and severe neck pain radiating to bilateral shoulders and arms.  Patient reports pain has progressively increased over the last several months, and describes as a tightness and pressure sensation.  Patient reports pain worsens with prolonged standing and activity, she currently rates her pain as a 7 out of 10.  Patient reports some pain relief with use of heating pad, massage therapy and Tylenol at home. Patient had left C7-T1 interlaminar epidural steroid injection in 2019 that provided her with 60% relief for 2 months. Patient's cervical MRI from 2019 exhibits paracentral disc protrusion and mild spinal stenosis at C4-C5.  Patient went to physical therapy in 2019 at [facility], which provided her with no pain relief.  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 Patient denies focal weakness, numbness and tingling. Patient denies recent trauma or falls.   Review of Systems  Musculoskeletal:  Positive for neck pain.  Neurological:  Negative for focal weakness.  All other systems reviewed and are negative. Otherwise per HPI.  Assessment & Plan: Visit Diagnoses:    ICD-10-CM   1. Radiculopathy, cervical region  M54.12     2. Cervical disc disorder with radiculopathy, unspecified cervical region  M50.10     3. Spinal stenosis of cervical region  M48.02        Plan: Findings:  Chronic, worsening, and severe cervical radiculopathy.  Patient's imaging and  clinical presentation are consistent with classic C5 vs C6 distribution.  Patient continues to have excruciating pain despite good conservative therapies at home.  Patient's plan of care and MRI findings discussed with her today in the office.  We believe the next step is to perform a diagnostic and hopefully therapeutic left C7-T1 interlaminar epidural steroid injection.  Patient encouraged to take Tylenol 500 mg 4 times a day for sustained pain relief.  Patient's course is complicated by history of DVT, and is currently taking Eliquis at home.  Patient informed that we will need to get permission for her to be off of Eliquis for 2 days prior to injection and is agreeable with plan.  No red flag symptoms noted upon exam. We will schedule injection once we get notification of anticoagulation.   Meds & Orders: No orders of the defined types were placed in this encounter.  No orders of the defined types were placed in this encounter.   Follow-up: Return in about 1 week (around 03/17/2021) for Left C7-T1 interlaminar epidural steroid injection.   Procedures: No procedures performed      Clinical History: MRI CERVICAL SPINE WITHOUT CONTRAST   TECHNIQUE: Multiplanar, multisequence MR imaging of the cervical spine was performed. No intravenous contrast was administered.   COMPARISON:  Prior radiographs from 02/28/2014.   FINDINGS: Alignment: Straightening of the normal cervical lordosis. No listhesis.   Vertebrae: Vertebral body heights are maintained without evidence for acute or chronic fracture. Bone marrow signal intensity within normal limits.  No discrete or worrisome osseous lesions. No abnormal marrow edema.   Cord: Signal intensity within the cervical spinal cord is normal.   Posterior Fossa, vertebral arteries, paraspinal tissues: Visualized brain and posterior fossa within normal limits. Craniocervical junction normal. Paraspinous and prevertebral soft tissues are normal. Normal  intravascular flow voids present within the vertebral arteries bilaterally.   Disc levels:   C2-C3: Unremarkable.   C3-C4: Tiny central disc protrusion indents the ventral thecal sac. No stenosis.   C4-C5: Broad and somewhat lobulated right paracentral disc protrusion flattens and indents the ventral thecal sac, extending laterally towards the right neural foramen (series 9, image 15). Mild flattening of the cervical spinal cord without cord signal changes. Mild spinal stenosis. Superimposed bilateral uncovertebral hypertrophy. Moderate right C5 foraminal stenosis.   C5-C6: Broad posterior disc protrusion, slightly eccentric to the right. Flattening with partial effacement of the ventral thecal sac. Minimal cord flattening without cord signal changes. Mild spinal stenosis. Right-sided uncovertebral spurring with resultant mild right C6 foraminal stenosis.   C6-C7: Mild disc bulge. No significant canal or neural foraminal stenosis.   C7-T1:  Unremarkable.   Visualized upper thoracic spine within normal limits.   IMPRESSION: 1. Broad right paracentral disc protrusion at C4-5 with resultant mild canal and moderate right C5 foraminal stenosis. 2. Broad posterior disc bulge at C5-6 with resultant mild canal with right C6 foraminal stenosis. 3. Mild disc bulging at C3-4 and C6-7 without significant stenosis.     Electronically Signed   By: Rise Mu M.D.   On: 10/23/2017 22:26   She reports that she has been smoking cigarettes. She started smoking about 17 years ago. She has a 8.00 pack-year smoking history. She has never used smokeless tobacco. No results for input(s): HGBA1C, LABURIC in the last 8760 hours.  Objective:  VS:  HT:    WT:   BMI:     BP:117/71  HR:91bpm  TEMP: ( )  RESP:  Physical Exam Constitutional:      Appearance: Normal appearance.  HENT:     Head: Normocephalic.     Right Ear: Tympanic membrane normal.     Left Ear: Tympanic membrane  normal.     Nose: Nose normal.     Mouth/Throat:     Mouth: Mucous membranes are moist.  Eyes:     Pupils: Pupils are equal, round, and reactive to light.  Cardiovascular:     Rate and Rhythm: Normal rate.     Pulses: Normal pulses.  Pulmonary:     Effort: Pulmonary effort is normal.  Abdominal:     General: There is no distension.  Musculoskeletal:     Cervical back: Tenderness present.     Comments: Discomfort noted with flexion, extension and side-to-side rotation. Good strength noted to bilateral upper extremities. Sensation intact bilaterally. Negative Hoffman's sign.    Skin:    General: Skin is warm and dry.     Capillary Refill: Capillary refill takes less than 2 seconds.  Neurological:     General: No focal deficit present.     Mental Status: She is alert.  Psychiatric:        Mood and Affect: Mood normal.    Ortho Exam  Imaging: No results found.  Past Medical/Family/Surgical/Social History: Medications & Allergies reviewed per EMR, new medications updated. Patient Active Problem List   Diagnosis Date Noted   BV (bacterial vaginosis) 03/26/2020   History of DVT (deep vein thrombosis) 03/05/2020   Back pain without radiation 03/05/2020  Routine screening for STI (sexually transmitted infection) 03/05/2020   Ear pain, right 09/24/2019   Hyperlipidemia 10/04/2018   Primary osteoarthritis of left knee 03/22/2018   Chest pain 09/06/2008   TOBACCO DEPENDENCE 10/20/2006   Past Medical History:  Diagnosis Date   Asthma    HERNIA, HIATAL, NONCONGENITAL 10/20/2006   Qualifier: Diagnosis of  By: Knox Royalty     Family History  Problem Relation Age of Onset   Asthma Mother    Rheum arthritis Mother    Arthritis Mother    Diabetes Father    Heart disease Father    Early death Father    Diabetes Paternal Grandmother    Heart disease Paternal Grandmother    Cancer Paternal Grandmother    Past Surgical History:  Procedure Laterality Date   CHOLECYSTECTOMY   2008   Social History   Occupational History   Not on file  Tobacco Use   Smoking status: Every Day    Packs/day: 0.50    Years: 16.00    Pack years: 8.00    Types: Cigarettes    Start date: 2005   Smokeless tobacco: Never   Tobacco comments:    Sts she quit 09/12/16  Vaping Use   Vaping Use: Some days  Substance and Sexual Activity   Alcohol use: No   Drug use: No   Sexual activity: Yes    Birth control/protection: I.U.D.

## 2021-03-10 NOTE — Telephone Encounter (Signed)
Patient needs to schedule  cervical epidural steroid injection with Dr. Alvester Morin at [place]. She will need to hold Eliquis for 2 days prior to the injection. Will this be ok?

## 2021-03-10 NOTE — Progress Notes (Signed)
Pt state neck pain that travels down both shoulder and arm. Pt state she feels pressure at the ball of her neck and spine. Pt state her arm going numb at time. Pt state she looks down at work and the way she stand makes the pain worse. Pt state she takes over the counter pain meds and heating to help ease her pain.  Numeric Pain Rating Scale and Functional Assessment Average Pain 8 Pain Right Now 6 My pain is constant, dull, and aching Pain is worse with: sitting, standing, and some activites Pain improves with: heat/ice and medication   In the last MONTH (on 0-10 scale) has pain interfered with the following?  1. General activity like being  able to carry out your everyday physical activities such as walking, climbing stairs, carrying groceries, or moving a chair?  Rating(9)  2. Relation with others like being able to carry out your usual social activities and roles such as  activities at home, at work and in your community. Rating(8)  3. Enjoyment of life such that you have  been bothered by emotional problems such as feeling anxious, depressed or irritable?  Rating(8)

## 2021-03-10 NOTE — Telephone Encounter (Signed)
Yes, that is fine. Please ensure patient knows to only hold for 2 days and then resume. Thank you!

## 2021-03-12 NOTE — Telephone Encounter (Signed)
Is auth needed for C7-T1 IL? Scheduled for 7/28.

## 2021-03-13 ENCOUNTER — Other Ambulatory Visit: Payer: Self-pay | Admitting: Family Medicine

## 2021-03-19 ENCOUNTER — Other Ambulatory Visit: Payer: Self-pay

## 2021-03-19 ENCOUNTER — Ambulatory Visit (INDEPENDENT_AMBULATORY_CARE_PROVIDER_SITE_OTHER): Payer: No Typology Code available for payment source | Admitting: Physical Medicine and Rehabilitation

## 2021-03-19 ENCOUNTER — Encounter: Payer: Self-pay | Admitting: Physical Medicine and Rehabilitation

## 2021-03-19 ENCOUNTER — Ambulatory Visit: Payer: Self-pay

## 2021-03-19 VITALS — BP 100/73 | HR 92

## 2021-03-19 DIAGNOSIS — M5412 Radiculopathy, cervical region: Secondary | ICD-10-CM | POA: Diagnosis not present

## 2021-03-19 MED ORDER — BETAMETHASONE SOD PHOS & ACET 6 (3-3) MG/ML IJ SUSP
12.0000 mg | Freq: Once | INTRAMUSCULAR | Status: AC
Start: 2021-03-19 — End: 2021-03-19
  Administered 2021-03-19: 12 mg

## 2021-03-19 NOTE — Progress Notes (Signed)
Pt state neck pain that travels to both shoulders. Pt state lifting item and looking down makes the pain worse. Pt state she takes over the counter pain meds and heating pad to help ease her pain.  Numeric Pain Rating Scale and Functional Assessment Average Pain 4   In the last MONTH (on 0-10 scale) has pain interfered with the following?  1. General activity like being  able to carry out your everyday physical activities such as walking, climbing stairs, carrying groceries, or moving a chair?  Rating(8)   +Driver, +BT stop for three days, -Dye Allergies.

## 2021-03-19 NOTE — Patient Instructions (Signed)

## 2021-03-20 ENCOUNTER — Telehealth: Payer: Self-pay | Admitting: Physical Medicine and Rehabilitation

## 2021-03-20 NOTE — Telephone Encounter (Signed)
Pt called about some side effects she is having with her injection from yesterday. She is nauseous, has a red face and arm numbness.  CB [phone]

## 2021-03-23 NOTE — Telephone Encounter (Signed)
Please advise 

## 2021-03-23 NOTE — Telephone Encounter (Signed)
Called patient to advise. She expressed understanding. 

## 2021-03-24 NOTE — Progress Notes (Signed)
Monica Sandoval - 45 y.o. female MRN [id number]  Date of birth: Sep 17, 1975  Office Visit Note: Visit Date: 03/19/2021 PCP: Maury Dus, MD Referred by: Maury Dus, MD  Subjective: Chief Complaint  Patient presents with   Neck - Pain   Right Shoulder - Pain   Left Shoulder - Pain   HPI:  Monica Sandoval is a 45 y.o. female who comes in today at the request of Ellin Goodie, FNP for planned Left C7-T1 Cervical Interlaminar epidural steroid injection with fluoroscopic guidance.  The patient has failed conservative care including home exercise, medications, time and activity modification.  This injection will be diagnostic and hopefully therapeutic.  Please see requesting physician notes for further details and justification. MRI reviewed with images and spine model.  MRI reviewed in the note below.     ROS Otherwise per HPI.  Assessment & Plan: Visit Diagnoses:    ICD-10-CM   1. Radiculopathy, cervical region  M54.12 XR C-ARM NO REPORT    Epidural Steroid injection    betamethasone acetate-betamethasone sodium phosphate (CELESTONE) injection 12 mg      Plan: No additional findings.   Meds & Orders:  Meds ordered this encounter  Medications   betamethasone acetate-betamethasone sodium phosphate (CELESTONE) injection 12 mg    Orders Placed This Encounter  Procedures   XR C-ARM NO REPORT   Epidural Steroid injection    Follow-up: Return for visit to requesting physician as needed.   Procedures: No procedures performed  Cervical Epidural Steroid Injection - Interlaminar Approach with Fluoroscopic Guidance  Patient: Monica Sandoval      Date of Birth: 1975/10/24 MRN: [id number] PCP: Maury Dus, MD      Visit Date: 03/19/2021   Universal Protocol:    Date/Time: 08/02/225:57 AM  Consent Given By: the patient  Position: PRONE  Additional Comments: Vital signs were monitored before and after the procedure. Patient was prepped and draped in  the usual sterile fashion. The correct patient, procedure, and site was verified.   Injection Procedure Details:   Procedure diagnoses: Radiculopathy, cervical region [M54.12]    Meds Administered:  Meds ordered this encounter  Medications   betamethasone acetate-betamethasone sodium phosphate (CELESTONE) injection 12 mg     Laterality: Left  Location/Site: C7-T1  Needle: 3.5 in., 20 ga. Tuohy  Needle Placement: Paramedian epidural space  Findings:  -Comments: Excellent flow of contrast into the epidural space.  Patient did feel initial warmness and pressure more left than right.  Procedure Details: Using a paramedian approach from the side mentioned above, the region overlying the inferior lamina was localized under fluoroscopic visualization and the soft tissues overlying this structure were infiltrated with 4 ml. of 1% Lidocaine without Epinephrine. A # 20 gauge, Tuohy needle was inserted into the epidural space using a paramedian approach.  The epidural space was localized using loss of resistance along with contralateral oblique bi-planar fluoroscopic views.  After negative aspirate for air, blood, and CSF, a 2 ml. volume of Isovue-250 was injected into the epidural space and the flow of contrast was observed. Radiographs were obtained for documentation purposes.   The injectate was administered into the level noted above.  Additional Comments:  The patient tolerated the procedure well Dressing: 2 x 2 sterile gauze and Band-Aid    Post-procedure details: Patient was observed during the procedure. Post-procedure instructions were reviewed.  Patient left the clinic in stable condition.   Clinical History: MRI CERVICAL SPINE WITHOUT CONTRAST   TECHNIQUE: Multiplanar, multisequence MR  imaging of the cervical spine was performed. No intravenous contrast was administered.   COMPARISON:  Prior radiographs from 02/28/2014.   FINDINGS: Alignment: Straightening of the  normal cervical lordosis. No listhesis.   Vertebrae: Vertebral body heights are maintained without evidence for acute or chronic fracture. Bone marrow signal intensity within normal limits. No discrete or worrisome osseous lesions. No abnormal marrow edema.   Cord: Signal intensity within the cervical spinal cord is normal.   Posterior Fossa, vertebral arteries, paraspinal tissues: Visualized brain and posterior fossa within normal limits. Craniocervical junction normal. Paraspinous and prevertebral soft tissues are normal. Normal intravascular flow voids present within the vertebral arteries bilaterally.   Disc levels:   C2-C3: Unremarkable.   C3-C4: Tiny central disc protrusion indents the ventral thecal sac. No stenosis.   C4-C5: Broad and somewhat lobulated right paracentral disc protrusion flattens and indents the ventral thecal sac, extending laterally towards the right neural foramen (series 9, image 15). Mild flattening of the cervical spinal cord without cord signal changes. Mild spinal stenosis. Superimposed bilateral uncovertebral hypertrophy. Moderate right C5 foraminal stenosis.   C5-C6: Broad posterior disc protrusion, slightly eccentric to the right. Flattening with partial effacement of the ventral thecal sac. Minimal cord flattening without cord signal changes. Mild spinal stenosis. Right-sided uncovertebral spurring with resultant mild right C6 foraminal stenosis.   C6-C7: Mild disc bulge. No significant canal or neural foraminal stenosis.   C7-T1:  Unremarkable.   Visualized upper thoracic spine within normal limits.   IMPRESSION: 1. Broad right paracentral disc protrusion at C4-5 with resultant mild canal and moderate right C5 foraminal stenosis. 2. Broad posterior disc bulge at C5-6 with resultant mild canal with right C6 foraminal stenosis. 3. Mild disc bulging at C3-4 and C6-7 without significant stenosis.     Electronically Signed   By:  Rise Mu M.D.   On: 10/23/2017 22:26     Objective:  VS:  HT:    WT:   BMI:     BP:100/73  HR:92bpm  TEMP: ( )  RESP:  Physical Exam Vitals and nursing note reviewed.  Constitutional:      General: She is not in acute distress.    Appearance: Normal appearance. She is not ill-appearing.  HENT:     Head: Normocephalic and atraumatic.     Right Ear: External ear normal.     Left Ear: External ear normal.  Eyes:     Extraocular Movements: Extraocular movements intact.  Cardiovascular:     Rate and Rhythm: Normal rate.     Pulses: Normal pulses.  Musculoskeletal:     Cervical back: Tenderness present. No rigidity.     Right lower leg: No edema.     Left lower leg: No edema.     Comments: Patient has good strength in the upper extremities including 5 out of 5 strength in wrist extension long finger flexion and APB.  There is no atrophy of the hands intrinsically.  There is a negative Hoffmann's test.   Lymphadenopathy:     Cervical: No cervical adenopathy.  Skin:    Findings: No erythema, lesion or rash.  Neurological:     General: No focal deficit present.     Mental Status: She is alert and oriented to person, place, and time.     Sensory: No sensory deficit.     Motor: No weakness or abnormal muscle tone.     Coordination: Coordination normal.  Psychiatric:        Mood and Affect:  Mood normal.        Behavior: Behavior normal.     Imaging: No results found.

## 2021-03-24 NOTE — Procedures (Signed)
Cervical Epidural Steroid Injection - Interlaminar Approach with Fluoroscopic Guidance  Patient: Monica Sandoval      Date of Birth: 08-Nov-1975 MRN: [id number] PCP: Maury Dus, MD      Visit Date: 03/19/2021   Universal Protocol:    Date/Time: 08/02/225:57 AM  Consent Given By: the patient  Position: PRONE  Additional Comments: Vital signs were monitored before and after the procedure. Patient was prepped and draped in the usual sterile fashion. The correct patient, procedure, and site was verified.   Injection Procedure Details:   Procedure diagnoses: Radiculopathy, cervical region [M54.12]    Meds Administered:  Meds ordered this encounter  Medications   betamethasone acetate-betamethasone sodium phosphate (CELESTONE) injection 12 mg     Laterality: Left  Location/Site: C7-T1  Needle: 3.5 in., 20 ga. Tuohy  Needle Placement: Paramedian epidural space  Findings:  -Comments: Excellent flow of contrast into the epidural space.  Patient did feel initial warmness and pressure more left than right.  Procedure Details: Using a paramedian approach from the side mentioned above, the region overlying the inferior lamina was localized under fluoroscopic visualization and the soft tissues overlying this structure were infiltrated with 4 ml. of 1% Lidocaine without Epinephrine. A # 20 gauge, Tuohy needle was inserted into the epidural space using a paramedian approach.  The epidural space was localized using loss of resistance along with contralateral oblique bi-planar fluoroscopic views.  After negative aspirate for air, blood, and CSF, a 2 ml. volume of Isovue-250 was injected into the epidural space and the flow of contrast was observed. Radiographs were obtained for documentation purposes.   The injectate was administered into the level noted above.  Additional Comments:  The patient tolerated the procedure well Dressing: 2 x 2 sterile gauze and Band-Aid     Post-procedure details: Patient was observed during the procedure. Post-procedure instructions were reviewed.  Patient left the clinic in stable condition.

## 2021-07-15 ENCOUNTER — Other Ambulatory Visit: Payer: Self-pay | Admitting: Family Medicine

## 2021-09-22 ENCOUNTER — Other Ambulatory Visit: Payer: Self-pay

## 2021-09-22 ENCOUNTER — Emergency Department (HOSPITAL_COMMUNITY): Payer: 59

## 2021-09-22 ENCOUNTER — Emergency Department (HOSPITAL_COMMUNITY)
Admission: EM | Admit: 2021-09-22 | Discharge: 2021-09-22 | Disposition: A | Discharge status: Home / Self Care | Payer: 59 | Attending: Emergency Medicine | Admitting: Emergency Medicine

## 2021-09-22 ENCOUNTER — Encounter (HOSPITAL_COMMUNITY): Payer: Self-pay

## 2021-09-22 DIAGNOSIS — R42 Dizziness and giddiness: Secondary | ICD-10-CM | POA: Diagnosis not present

## 2021-09-22 DIAGNOSIS — J219 Acute bronchiolitis, unspecified: Secondary | ICD-10-CM | POA: Diagnosis not present

## 2021-09-22 DIAGNOSIS — R0789 Other chest pain: Secondary | ICD-10-CM

## 2021-09-22 DIAGNOSIS — R0781 Pleurodynia: Secondary | ICD-10-CM | POA: Diagnosis present

## 2021-09-22 DIAGNOSIS — M5412 Radiculopathy, cervical region: Secondary | ICD-10-CM | POA: Insufficient documentation

## 2021-09-22 DIAGNOSIS — Z7901 Long term (current) use of anticoagulants: Secondary | ICD-10-CM | POA: Diagnosis not present

## 2021-09-22 DIAGNOSIS — Z20822 Contact with and (suspected) exposure to covid-19: Secondary | ICD-10-CM | POA: Diagnosis not present

## 2021-09-22 HISTORY — DX: Acute embolism and thrombosis of unspecified deep veins of unspecified lower extremity: I82.409

## 2021-09-22 LAB — CBC WITH DIFFERENTIAL/PLATELET
Abs Immature Granulocytes: 0.03 10*3/uL (ref 0.00–0.07)
Basophils Absolute: 0.1 10*3/uL (ref 0.0–0.1)
Basophils Relative: 1 %
Eosinophils Absolute: 0.2 10*3/uL (ref 0.0–0.5)
Eosinophils Relative: 3 %
HCT: 42.2 % (ref 36.0–46.0)
Hemoglobin: 14.1 g/dL (ref 12.0–15.0)
Immature Granulocytes: 0 %
Lymphocytes Relative: 36 %
Lymphs Abs: 2.7 10*3/uL (ref 0.7–4.0)
MCH: 30.3 pg (ref 26.0–34.0)
MCHC: 33.4 g/dL (ref 30.0–36.0)
MCV: 90.6 fL (ref 80.0–100.0)
Monocytes Absolute: 0.4 10*3/uL (ref 0.1–1.0)
Monocytes Relative: 5 %
Neutro Abs: 4.1 10*3/uL (ref 1.7–7.7)
Neutrophils Relative %: 55 %
Platelets: 277 10*3/uL (ref 150–400)
RBC: 4.66 MIL/uL (ref 3.87–5.11)
RDW: 14 % (ref 11.5–15.5)
WBC: 7.6 10*3/uL (ref 4.0–10.5)
nRBC: 0 % (ref 0.0–0.2)

## 2021-09-22 LAB — BASIC METABOLIC PANEL
Anion gap: 7 (ref 5–15)
BUN: 8 mg/dL (ref 6–20)
CO2: 27 mmol/L (ref 22–32)
Calcium: 9.2 mg/dL (ref 8.9–10.3)
Chloride: 107 mmol/L (ref 98–111)
Creatinine, Ser: 0.71 mg/dL (ref 0.44–1.00)
GFR, Estimated: >60 mL/min (ref >60)
Glucose, Bld: 94 mg/dL (ref 70–99)
Potassium: 3.4 mmol/L — ABNORMAL LOW (ref 3.5–5.1)
Sodium: 141 mmol/L (ref 135–145)

## 2021-09-22 LAB — TROPONIN I (HIGH SENSITIVITY)
Troponin I (High Sensitivity): <2 ng/L (ref <18)
Troponin I (High Sensitivity): <2 ng/L (ref <18)

## 2021-09-22 LAB — RESP PANEL BY RT-PCR (FLU A&B, COVID) ARPGX2
Influenza A by PCR: NEGATIVE
Influenza B by PCR: NEGATIVE
SARS Coronavirus 2 by RT PCR: NEGATIVE

## 2021-09-22 MED ORDER — ALBUTEROL SULFATE HFA 108 (90 BASE) MCG/ACT IN AERS
2.0000 | INHALATION_SPRAY | Freq: Once | RESPIRATORY_TRACT | Status: AC
  Administered 2021-09-22: 2 via RESPIRATORY_TRACT
  Filled 2021-09-22: qty 6.7

## 2021-09-22 MED ORDER — GABAPENTIN 100 MG PO CAPS: 100.0000 mg | ORAL_CAPSULE | Freq: Three times a day (TID) | ORAL | 0 refills | Status: DC

## 2021-09-22 MED ORDER — ALUM & MAG HYDROXIDE-SIMETH 200-200-20 MG/5ML PO SUSP
30.0000 mL | Freq: Once | ORAL | Status: AC
  Administered 2021-09-22: 30 mL via ORAL
  Filled 2021-09-22: qty 30

## 2021-09-22 MED ORDER — IOHEXOL 350 MG/ML SOLN
100.0000 mL | Freq: Once | INTRAVENOUS | Status: AC | PRN
  Administered 2021-09-22: 100 mL via INTRAVENOUS

## 2021-09-22 MED ORDER — LIDOCAINE VISCOUS HCL 2 % MT SOLN
15.0000 mL | Freq: Once | OROMUCOSAL | Status: AC
  Administered 2021-09-22: 15 mL via ORAL
  Filled 2021-09-22: qty 15

## 2021-09-22 NOTE — ED Triage Notes (Addendum)
"  Central chest pain constant x 1 week and is getting more severe, nothing makes pain better or worse, left arm also has been having numbness for a week as well, got concerned because yesterday I started having dizziness on and off so I decided to come and get checked out" per pt Denies any cough, cold symptoms or recent illness. Patient does state that she had a DVT around 2 years ago and is eloquis, but she did not take it for 3 weeks and just started it back a week ago and is worried she may have a blood clot.

## 2021-09-22 NOTE — Discharge Instructions (Addendum)
You were evaluated in the Emergency Department and after careful evaluation, we did not find any emergent condition requiring admission or further testing in the hospital.  Your exam/testing today was overall reassuring.  Your EKG did not reveal any acute changes, your CT imaging did not reveal evidence of a blood clot in your lungs.  It did reveal thickening of your pulmonary tree which could be consistent with a viral infection.  Your cardiac enzymes were negative x2 meaning the likelihood of heart attack is extremely low.  NSAIDs unfortunately increase your risk of bleeding when combined with Eliquis.  Will trial gabapentin for nerve pain radiating down your arm.  Please return to the Emergency Department if you experience any worsening of your condition.  Thank you for allowing [name] to be a part of your care.

## 2021-09-22 NOTE — ED Provider Triage Note (Signed)
Emergency Medicine Provider Triage Evaluation Note  Monica Sandoval , a 46 y.o. female  was evaluated in triage.  Pt complains of substernal chest pain over the last week.  Patient does have a history of DVT which she was taking Eliquis.  She did have a 3-week history where she did not take her Eliquis.  Restarted about 2 weeks ago.  She did say that she had left leg pain as well which has resolved after restarting Eliquis.  No significant short of breath.  Chest pain radiates into the left arm.  Review of Systems  Positive:  Negative: See above   Physical Exam  BP (!) 148/93 (BP Location: Left Arm)    Pulse 67    Temp 98.5 F (36.9 C) (Oral)    Resp 20    Ht 5\' 5"  (1.651 m)    Wt 97.5 kg    SpO2 100%    BMI 35.78 kg/m  Gen:   Awake, no distress   Resp:  Normal effort, diffuse expiratory wheezing MSK:   Moves extremities without difficulty  Other:    Medical Decision Making  Medically screening exam initiated at 8:29 PM.  Appropriate orders placed.  was informed that the remainder of the evaluation will be completed by another provider, this initial triage assessment does not replace that evaluation, and the importance of remaining in the ED until their evaluation is complete.     [place] Ouch Peshtigo, [place] 09/22/21 2029

## 2021-09-22 NOTE — ED Provider Notes (Signed)
[place] DEPT Provider Note   CSN: [id number] Arrival date & time: 09/22/21  2004     History  Chief Complaint  Patient presents with   Chest Pain    Monica Sandoval is a 46 y.o. female.   Chest Pain  46 year old female with a history of DVT on Eliquis (noncompliant for 3 weeks previously) who presents to the emergency department with a complaint of chest pain.  She states that she has had pleuritic chest pain.  This was preceded by left leg pain and cramping which resolved after restarting Eliquis.  She denies any shortness of breath.  She states that her chest pain is nonradiating.  She additionally endorses radicular pain from her neck down to her left arm.  She states that this pain is separate from her chest pain.  She endorses lightheadedness.  She denies any syncope.  She denies any fevers or chills.  She denies any cough.  She states that she is now taking her Eliquis again because she is worried about blood clots. Home Medications Prior to Admission medications   Medication Sig Start Date End Date Taking? Authorizing Provider  gabapentin (NEURONTIN) 100 MG capsule Take 1 capsule (100 mg total) by mouth 3 (three) times daily for 14 days. 09/22/21 10/06/21 Yes Regan Lemming, MD  albuterol (VENTOLIN HFA) 108 (90 Base) MCG/ACT inhaler TAKE 2 PUFFS BY MOUTH EVERY 4 HOURS AS NEEDED FOR WHEEZE 02/27/21   Alcus Dad, MD  ELIQUIS 5 MG TABS tablet TAKE 1 TABLET BY MOUTH TWICE A DAY 03/06/21   Alcus Dad, MD  esomeprazole (NEXIUM) 40 MG capsule Take 40 mg by mouth daily at 12 noon.    [provider]  metoprolol tartrate (LOPRESSOR) 100 MG tablet Take 100 mg (1 tablet) 2 hours prior to CT scan. 01/21/21   Donato Heinz, MD  rosuvastatin (CRESTOR) 10 MG tablet TAKE 1 TABLET BY MOUTH EVERY DAY 07/15/21   Alcus Dad, MD      Allergies    Patient has no known allergies.    Review of Systems   Review of Systems   Cardiovascular:  Positive for chest pain.   Physical Exam Updated Vital Signs BP (!) 142/90 (BP Location: Left Arm)    Pulse 78    Temp 98.1 F (36.7 C) (Oral)    Resp 20    Ht 5\' 5"  (1.651 m)    Wt 97.5 kg    SpO2 100%    BMI 35.78 kg/m  Physical Exam Vitals and nursing note reviewed.  Constitutional:      General: She is not in acute distress.    Appearance: She is well-developed.  HENT:     Head: Normocephalic and atraumatic.  Eyes:     Conjunctiva/sclera: Conjunctivae normal.     Pupils: Pupils are equal, round, and reactive to light.  Neck:     Comments: Negative Spurling sign Cardiovascular:     Rate and Rhythm: Normal rate and regular rhythm.     Heart sounds: No murmur heard. Pulmonary:     Effort: Pulmonary effort is normal. No respiratory distress.     Breath sounds: Normal breath sounds.  Abdominal:     General: There is no distension.     Palpations: Abdomen is soft.     Tenderness: There is no abdominal tenderness. There is no guarding.  Musculoskeletal:        General: No swelling, deformity or signs of injury.     Cervical back:  Neck supple.  Skin:    General: Skin is warm and dry.     Capillary Refill: Capillary refill takes less than 2 seconds.     Findings: No lesion or rash.  Neurological:     General: No focal deficit present.     Mental Status: She is alert. Mental status is at baseline.     GCS: GCS eye subscore is 4. GCS verbal subscore is 5. GCS motor subscore is 6.     Cranial Nerves: Cranial nerves 2-12 are intact.     Comments: 5 out of 5 strength in the bilateral upper extremities with intact sensation to light touch.  Psychiatric:        Mood and Affect: Mood normal.    ED Results / Procedures / Treatments   Labs (all labs ordered are listed, but only abnormal results are displayed) Labs Reviewed  BASIC METABOLIC PANEL - Abnormal; Notable for the following components:      Result Value   Potassium 3.4 (*)    All other components  within normal limits  RESP PANEL BY RT-PCR (FLU A&B, COVID) ARPGX2  CBC WITH DIFFERENTIAL/PLATELET  TROPONIN I (HIGH SENSITIVITY)  TROPONIN I (HIGH SENSITIVITY)    EKG EKG Interpretation  Date/Time:  Tuesday September 22 2021 20:16:47 EST Ventricular Rate:  79 PR Interval:  163 QRS Duration: 90 QT Interval:  394 QTC Calculation: 452 R Axis:   64 Text Interpretation: Sinus rhythm Confirmed by Regan Lemming ([id number]) on 09/22/2021 10:49:28 PM  Radiology CT Angio Chest PE W and/or Wo Contrast  Result Date: 09/22/2021 CLINICAL DATA:  Substernal chest pain for 1 week, history of DVT EXAM: CT ANGIOGRAPHY CHEST WITH CONTRAST TECHNIQUE: Multidetector CT imaging of the chest was performed using the standard protocol during bolus administration of intravenous contrast. Multiplanar CT image reconstructions and MIPs were obtained to evaluate the vascular anatomy. RADIATION DOSE REDUCTION: This exam was performed according to the departmental dose-optimization program which includes automated exposure control, adjustment of the mA and/or kV according to patient size and/or use of iterative reconstruction technique. CONTRAST:  165mL OMNIPAQUE IOHEXOL 350 MG/ML SOLN COMPARISON:  07/09/2019 FINDINGS: Cardiovascular: This is a technically adequate evaluation of the pulmonary vasculature. No filling defects or pulmonary emboli. The heart is unremarkable without pericardial effusion. No evidence of thoracic aortic aneurysm or dissection. Mediastinum/Nodes: No enlarged mediastinal, hilar, or axillary lymph nodes. Thyroid gland, trachea, and esophagus demonstrate no significant findings. Small hiatal hernia. Lungs/Pleura: No acute airspace disease, effusion, or pneumothorax. There is mild diffuse bronchial wall thickening, with scattered geographic areas of ground-glass attenuation consistent with bronchiolitis. Central airways are patent. Upper Abdomen: No acute abnormality. Musculoskeletal: No acute or destructive bony  lesions. Reconstructed images demonstrate no additional findings. Review of the MIP images confirms the above findings. IMPRESSION: 1. No evidence of pulmonary embolus. 2. Mild diffuse bronchial wall thickening with scattered geographic ground-glass attenuation in the lungs, consistent with bronchiolitis. 3. Hiatal hernia. Electronically Signed   By: Randa Ngo M.D.   On: 09/22/2021 22:03    Procedures Procedures    Medications Ordered in ED Medications  iohexol (OMNIPAQUE) 350 MG/ML injection 100 mL (100 mLs Intravenous Contrast Given 09/22/21 2143)  alum & mag hydroxide-simeth (MAALOX/MYLANTA) 200-200-20 MG/5ML suspension 30 mL (30 mLs Oral Given 09/22/21 2345)    And  lidocaine (XYLOCAINE) 2 % viscous mouth solution 15 mL (15 mLs Oral Given 09/22/21 2345)  albuterol (VENTOLIN HFA) 108 (90 Base) MCG/ACT inhaler 2 puff (2 puffs Inhalation Given 09/22/21  Aija.Mandril)    ED Course/ Medical Decision Making/ A&P                           Medical Decision Making Risk OTC drugs. Prescription drug management.   46 year old female with a history of DVT on Eliquis (noncompliant for 3 weeks previously) who presents to the emergency department with a complaint of chest pain.  She states that she has had pleuritic chest pain.  This was preceded by left leg pain and cramping which resolved after restarting Eliquis.  She denies any shortness of breath.  She states that her chest pain is nonradiating.  She additionally endorses radicular pain from her neck down to her left arm.  She states that this pain is separate from her chest pain.  She endorses lightheadedness.  She denies any syncope.  She denies any fevers or chills.  She denies any cough.  She states that she is now taking her Eliquis again because she is worried about blood clots.  On arrival, the patient was afebrile, hemodynamically stable, mildly hypertensive BP 148/93, saturating 100% on room air.  She is not tachycardic or tachypneic.  She does  have a history of DVT and was noncompliant with her Eliquis for period of time before restarting it.  She does present with concerning signs for DVT and subsequent PE.  Additionally, she describes radicular pain and symptoms from her neck down her left arm.  She is neurologically intact today on exam.   Differential diagnosis includes viral URI, bronchitis, PE, DVT, less likely pericarditis/myocarditis, pneumonia, pneumothorax, ACS.  Low concern for aortic dissection, aortic aneurysm or Boerhaave's.  The patient has no midline tenderness palpation of the neck and no acute weakness.  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  Will trial gabapentin for her cervical radiculopathy.  An EKG was performed as per above which revealed no acute ischemic changes. CTA PE study was performed and was negative for acute PE.  Did show evidence of bronchiolitis on CT imaging.  Also present was a hiatal hernia which could be causing some discomfort.  The patient was administered Maalox and viscous lidocaine.  She was administered albuterol inhaler.  Patient had negative COVID-19 and influenza testing, troponins x2 which were negative, BMP which was unremarkable with the exception of mild hypokalemia to 3.4, CBC that was unremarkable.  No evidence for bacterial pneumonia on CT imaging.  Evidence for viral bronchiolitis/bronchitis process.  The patient was informed of these negative findings.  She was advised to continue outpatient treatment with her Eliquis.  Low concern for ACS at this time.  Overall stable for discharge.   Final Clinical Impression(s) / ED Diagnoses Final diagnoses:  Atypical chest pain  Bronchiolitis  Cervical radiculopathy    Rx / DC Orders ED Discharge Orders          Ordered    gabapentin (NEURONTIN) 100 MG capsule  3 times daily        09/22/21 2333               Regan Lemming, MD 09/24/21 1649

## 2021-09-22 NOTE — ED Notes (Signed)
Patient transported to CT 

## 2021-10-06 ENCOUNTER — Other Ambulatory Visit: Payer: Self-pay | Admitting: Family Medicine

## 2021-10-06 DIAGNOSIS — I82442 Acute embolism and thrombosis of left tibial vein: Secondary | ICD-10-CM

## 2021-10-12 ENCOUNTER — Ambulatory Visit (INDEPENDENT_AMBULATORY_CARE_PROVIDER_SITE_OTHER): Payer: No Typology Code available for payment source | Admitting: Family Medicine

## 2021-10-12 ENCOUNTER — Encounter: Payer: Self-pay | Admitting: Family Medicine

## 2021-10-12 ENCOUNTER — Other Ambulatory Visit: Payer: Self-pay

## 2021-10-12 VITALS — BP 111/73 | HR 71 | Ht 64.0 in | Wt 213.1 lb

## 2021-10-12 DIAGNOSIS — E785 Hyperlipidemia, unspecified: Secondary | ICD-10-CM | POA: Diagnosis not present

## 2021-10-12 DIAGNOSIS — Z86718 Personal history of other venous thrombosis and embolism: Secondary | ICD-10-CM | POA: Diagnosis not present

## 2021-10-12 DIAGNOSIS — Z1231 Encounter for screening mammogram for malignant neoplasm of breast: Secondary | ICD-10-CM | POA: Diagnosis not present

## 2021-10-12 DIAGNOSIS — Z23 Encounter for immunization: Secondary | ICD-10-CM | POA: Diagnosis not present

## 2021-10-12 DIAGNOSIS — Z0000 Encounter for general adult medical examination without abnormal findings: Secondary | ICD-10-CM

## 2021-10-12 DIAGNOSIS — Z1159 Encounter for screening for other viral diseases: Secondary | ICD-10-CM

## 2021-10-12 NOTE — Patient Instructions (Addendum)
It was wonderful to see you today. Thank you for allowing me to be a part of your care. Below is a short summary of what we discussed at your visit today:  Blood clot You are safe to stop taking Eliquis at this time.  The day after you stop taking Eliquis you should start aspirin 81 mg/day.  If you feel like you are experiencing swelling, pain, or other symptoms that feel like your DVT please come to the hospital or clinic for evaluation.  Mammogram I have ordered your routine mammogram to screen for breast cancer. This will be at the [facility]. You will call them directly to make an appointment at your convenience. Information below.     Health Maintenance We like to think about ways to keep you healthy for years to come. Below are some interventions and screenings we can offer to keep you healthy: - COVID and flu vaccines (available here at clinic) - Hepatitis C screening (done today) -  LDL screen (done today in clinic)  Please bring all of your medications to every appointment!  If you have any questions or concerns, please do not hesitate to contact us via phone or MyChart message.   Fayette Pho, MD

## 2021-10-12 NOTE — Progress Notes (Signed)
° ° °  SUBJECTIVE:   CHIEF COMPLAINT / HPI:   DVT prophylaxis - currently on Eliquis 5 mg BID for unprovoked DVT approximately 2 years ago - comes in annually to discuss risks and benefits of continued Eliquis use - seen in ED 09/22/21 for leg and chest pain concerning for DVT - no PE on CTA - no US performed - quit smoking in August but is vaping (one cartridge every 2 weeks with daily use) - currently BMI 36 - when she goes without Eliquis, says her LLE hurts just like it did when she first had the DVT - requests to switch to baby aspirin if appropriate given cost of Eliquis  Health Maintenance  - due for COVID and flu (did get flu vax today) - mammogram ordered  - Hep C screen ordered - direct LDL for lipid response  PERTINENT  PMH / PSH:  Patient Active Problem List   Diagnosis Date Noted   Healthcare maintenance 10/16/2021   History of DVT (deep vein thrombosis) 03/05/2020   Back pain without radiation 03/05/2020   Routine screening for STI (sexually transmitted infection) 03/05/2020   Ear pain, right 09/24/2019   Hyperlipidemia 10/04/2018   Primary osteoarthritis of left knee 03/22/2018     OBJECTIVE:   BP 111/73    Pulse 71    Ht 5\' 4"  (1.626 m)    Wt 213 lb 2 oz (96.7 kg)    SpO2 100%    BMI 36.58 kg/m    PHQ-9:  Depression screen [facility] 2/9 10/12/2021 03/24/2020 03/04/2020  Decreased Interest 0 0 0  Down, Depressed, Hopeless 0 0 0  PHQ - 2 Score 0 0 0  Altered sleeping 1 0 -  Tired, decreased energy 1 0 -  Change in appetite 0 0 -  Feeling bad or failure about yourself  0 0 -  Trouble concentrating 0 0 -  Moving slowly or fidgety/restless 0 0 -  Suicidal thoughts 0 0 -  PHQ-9 Score 2 0 -  Difficult doing work/chores Not difficult at all Not difficult at all -    GAD-7: No flowsheet data found.   Physical Exam General: Awake, alert, oriented, no acute distress Respiratory: Unlabored respirations, speaking in full sentences, no respiratory distress Extremities:  Moving all extremities spontaneously Neuro: Cranial nerves II through X grossly intact Psych: Normal insight and judgement  ASSESSMENT/PLAN:   History of DVT (deep vein thrombosis) Discussed risks and benefits of continuing versus stopping Eliquis.  Patient will prefer to stop Eliquis and start baby aspirin.  BMI today 36 but patient has also stopped smoking (still vaping though).  Discussed with preceptor, we believe given her new risk reduction and no diagnosed clotting disorder in either herself or her family that it would be safe to stop Eliquis and start aspirin 81 mg daily.  Return and ED precautions given.  Healthcare maintenance - Declined COVID vaccine, did receive influenza vaccine - Amenable to hep C screen today - Ordered mammogram - Direct LDL today for lipid response     03/06/2020, MD [facility]

## 2021-10-13 ENCOUNTER — Encounter: Payer: Self-pay | Admitting: Family Medicine

## 2021-10-13 LAB — HCV INTERPRETATION

## 2021-10-13 LAB — HCV AB W REFLEX TO QUANT PCR: HCV Ab: NONREACTIVE

## 2021-10-13 LAB — LDL CHOLESTEROL, DIRECT: LDL Direct: 109 mg/dL — ABNORMAL HIGH (ref 0–99)

## 2021-10-16 ENCOUNTER — Encounter: Payer: Self-pay | Admitting: Family Medicine

## 2021-10-16 DIAGNOSIS — Z0000 Encounter for general adult medical examination without abnormal findings: Secondary | ICD-10-CM | POA: Insufficient documentation

## 2021-10-16 NOTE — Assessment & Plan Note (Signed)
-   Declined COVID vaccine, did receive influenza vaccine - Amenable to hep C screen today - Ordered mammogram - Direct LDL today for lipid response

## 2021-10-16 NOTE — Assessment & Plan Note (Signed)
Discussed risks and benefits of continuing versus stopping Eliquis.  Patient will prefer to stop Eliquis and start baby aspirin.  BMI today 36 but patient has also stopped smoking (still vaping though).  Discussed with preceptor, we believe given her new risk reduction and no diagnosed clotting disorder in either herself or her family that it would be safe to stop Eliquis and start aspirin 81 mg daily.  Return and ED precautions given.

## 2021-11-25 ENCOUNTER — Ambulatory Visit
Admission: RE | Admit: 2021-11-25 | Discharge: 2021-11-25 | Disposition: A | Discharge status: Home / Self Care | Payer: No Typology Code available for payment source | Source: Ambulatory Visit | Attending: Family Medicine | Admitting: Family Medicine

## 2021-11-25 DIAGNOSIS — Z1231 Encounter for screening mammogram for malignant neoplasm of breast: Secondary | ICD-10-CM

## 2021-11-26 ENCOUNTER — Encounter: Payer: Self-pay | Admitting: Family Medicine

## 2021-12-31 ENCOUNTER — Other Ambulatory Visit: Payer: Self-pay | Admitting: Family Medicine

## 2022-03-04 ENCOUNTER — Ambulatory Visit: Payer: No Typology Code available for payment source

## 2022-03-04 NOTE — Progress Notes (Deleted)
  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  General: NAD, pleasant, able to participate in exam Cardiac: RRR, no murmurs auscultated Respiratory: CTAB, normal WOB Abdomen: soft, non-tender, non-distended, normoactive bowel sounds Extremities: warm and well perfused, no edema or cyanosis Skin: warm and dry, no rashes noted Neuro: alert, no obvious focal deficits, speech normal Psych: Normal affect and mood  ASSESSMENT/PLAN:  No problem-specific Assessment & Plan notes found for this encounter.   No orders of the defined types were placed in this encounter.  No orders of the defined types were placed in this encounter.  No follow-ups on file. Shelby Mattocks, DO 03/04/2022, 7:42 AM PGY-***, [facility] {    This will disappear when note is signed, click to select method of visit    :1}

## 2022-03-09 ENCOUNTER — Ambulatory Visit (INDEPENDENT_AMBULATORY_CARE_PROVIDER_SITE_OTHER): Payer: No Typology Code available for payment source | Admitting: Family Medicine

## 2022-03-09 VITALS — BP 112/80 | HR 93 | Wt 210.6 lb

## 2022-03-09 DIAGNOSIS — R4189 Other symptoms and signs involving cognitive functions and awareness: Secondary | ICD-10-CM | POA: Diagnosis not present

## 2022-03-09 DIAGNOSIS — F22 Delusional disorders: Secondary | ICD-10-CM

## 2022-03-09 DIAGNOSIS — R519 Headache, unspecified: Secondary | ICD-10-CM | POA: Diagnosis not present

## 2022-03-09 NOTE — Patient Instructions (Addendum)
Thank you for coming to see me today. It was a pleasure. Today we discussed your symptoms. It could be due to covid. This is called "long covid".  I recommend lab work and ct scan of your brain to rule out a stroke.  We will get some labs today.  If they are abnormal or we need to do something about them, I will call you.  If they are normal, I will send you a message on MyChart (if it is active) or a letter in the mail.  If you don't hear from [name] in 2 weeks, please call the office at the number below.   If your symptoms worsen please go to the ER.  Please follow-up with me PCP  If you have any questions or concerns, please do not hesitate to call the office at [phone].  Best wishes,   Dr Allena Katz

## 2022-03-09 NOTE — Progress Notes (Addendum)
=      SUBJECTIVE:   CHIEF COMPLAINT / HPI:   Monica Sandoval is a 46 y.o. female presents for follow up  Pt expresses paranoid symptoms over 1 year. She had her husband arrested as she thought he was going to hurt her and she thought people were going to start "dropping dead".  She also bought a $600 generator thinking her power  would go out. These sx started after she has covid last year. Assoc sx include: jumbled words, headaches and brain fogginess. Denies depression or anxiety. Denies auditory, tactile or visual hallucinations. Still able to work.   She reports she has periods of time where she has word finding difficulties and jumbles the order of her words. No vision changes, confusion, vomiting, unilateral limb weakness, seizures, paraesthesia/numbness, ataxia or urinary/bowel incontinence.   Headache  Onset: gradual  Location: left temporal Quality: sharp Frequency: daily Precipitating factors: no  Prior treatment: tylenol  Associated Symptoms Nausea/vomiting: no  Photophobia/phonophobia: no  Tearing of eyes: no  Sinus pain/pressure: no  Family hx migraine: no  Personal stressors: no  Relation to menstrual cycle: no   Red Flags Fever: no  Neck pain/stiffness: no  Vision/speech/swallow/hearing difficulty: no  Focal weakness/numbness: no  Altered mental status: no  Trauma: no  New type of headache: yes  Anticoagulant use: no  H/o cancer/HIV/Pregnancy: no    Flowsheet Row Office Visit from 03/09/2022 in [facility]  PHQ-9 Total Score 9      PERTINENT  PMH / PSH: OA, back pain, DVT  OBJECTIVE:   BP 112/80   Pulse 93   Wt 210 lb 9.6 oz (95.5 kg)   SpO2 98%   BMI 36.15 kg/m    General: Alert, no acute distress Cardio: Normal S1 and S2, RRR, no r/m/g Pulm: CTAB, normal work of breathing Abdomen: Bowel sounds normal. Abdomen soft and non-tender.  Extremities: No peripheral edema.  Neuro: Cranial nerve exam normal    ASSESSMENT/PLAN:   Brain fog Unclear cause of brain fog symptoms. Could be 2/2 long covid. Cranial nerve exam is normal. Pt was mentating well today, no word finding difficulties which is reassuring. Precepted pt with Dr Monica Sandoval. Given pt has also had intermittent headaches and word finding difficulties would like to obtain CT head WO contrast to rule out gross intracranial pathology. At the moment MRI brain is not indicated. Reassured pt that some of her symptoms could be due to "long covid" and took take some time to recover. Obtained general lab work today to rule out organic pathology: CBC, CMP, b12, folate, TSH, RPR and HIV.   Headache Likely tension headache. No red flags. Unsure whether the headaches are linked to her paranoia, brain fog and word finding difficulties. Recommended tylenol and ibuprofen PRN for headache. Will obtain CT head WO contrast as above. Strict return precautions given to pt.  Paranoia (HCC) Unclear cause. Unsure whether it is related to long covid. 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 Low suspicion for schizophrenia given lack of negative thoughts, delusions, hallucinations, disorganized thought pattern or disorganized behavior. Offered pt psychiatry referral however she declined. Continue to monitor symptoms.     Monica Octave, MD PGY-3 [facility]

## 2022-03-10 ENCOUNTER — Encounter: Payer: Self-pay | Admitting: Family Medicine

## 2022-03-10 DIAGNOSIS — R4189 Other symptoms and signs involving cognitive functions and awareness: Secondary | ICD-10-CM | POA: Insufficient documentation

## 2022-03-10 LAB — VITAMIN B12: Vitamin B-12: 367 pg/mL (ref 232–1245)

## 2022-03-10 LAB — CBC
Hematocrit: 46.8 % — ABNORMAL HIGH (ref 34.0–46.6)
Hemoglobin: 15.5 g/dL (ref 11.1–15.9)
MCH: 29.5 pg (ref 26.6–33.0)
MCHC: 33.1 g/dL (ref 31.5–35.7)
MCV: 89 fL (ref 79–97)
Platelets: 296 10*3/uL (ref 150–450)
RBC: 5.25 x10E6/uL (ref 3.77–5.28)
RDW: 13.8 % (ref 11.7–15.4)
WBC: 6.9 10*3/uL (ref 3.4–10.8)

## 2022-03-10 LAB — BASIC METABOLIC PANEL
BUN/Creatinine Ratio: 8 — ABNORMAL LOW (ref 9–23)
BUN: 7 mg/dL (ref 6–24)
CO2: 27 mmol/L (ref 20–29)
Calcium: 9.8 mg/dL (ref 8.7–10.2)
Chloride: 101 mmol/L (ref 96–106)
Creatinine, Ser: 0.9 mg/dL (ref 0.57–1.00)
Glucose: 94 mg/dL (ref 70–99)
Potassium: 4.4 mmol/L (ref 3.5–5.2)
Sodium: 141 mmol/L (ref 134–144)
eGFR: 80 mL/min/{1.73_m2} (ref >59)

## 2022-03-10 LAB — HIV ANTIBODY (ROUTINE TESTING W REFLEX): HIV Screen 4th Generation wRfx: NONREACTIVE

## 2022-03-10 LAB — TSH: TSH: 1.37 u[IU]/mL (ref 0.450–4.500)

## 2022-03-10 LAB — FOLATE: Folate: 4 ng/mL (ref >3.0)

## 2022-03-10 LAB — RPR: RPR Ser Ql: NONREACTIVE

## 2022-03-10 NOTE — Assessment & Plan Note (Addendum)
Unclear cause of brain fog symptoms. Could be 2/2 long covid. Cranial nerve exam is normal. Pt was mentating well today, no word finding difficulties which is reassuring. Precepted pt with Dr Leveda Anna. Given pt has also had intermittent headaches and word finding difficulties would like to obtain CT head WO contrast to rule out gross intracranial pathology. At the moment MRI brain is not indicated. Reassured pt that some of her symptoms could be due to "long covid" and took take some time to recover. Obtained general lab work today to rule out organic pathology: CBC, CMP, b12, folate, TSH, RPR and HIV.

## 2022-03-11 DIAGNOSIS — F22 Delusional disorders: Secondary | ICD-10-CM | POA: Insufficient documentation

## 2022-03-11 DIAGNOSIS — R519 Headache, unspecified: Secondary | ICD-10-CM | POA: Insufficient documentation

## 2022-03-11 NOTE — Assessment & Plan Note (Addendum)
Unclear cause. Unsure whether it is related to long covid. 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 Low suspicion for schizophrenia given lack of negative thoughts, delusions, hallucinations, disorganized thought pattern or disorganized behavior. Offered pt psychiatry referral however she declined. Continue to monitor symptoms.

## 2022-03-11 NOTE — Assessment & Plan Note (Addendum)
Likely tension headache. No red flags. Unsure whether the headaches are linked to her paranoia, brain fog and word finding difficulties. Recommended tylenol and ibuprofen PRN for headache. Will obtain CT head WO contrast as above. Strict return precautions given to pt.

## 2022-07-09 ENCOUNTER — Telehealth: Payer: Self-pay

## 2022-07-09 DIAGNOSIS — F22 Delusional disorders: Secondary | ICD-10-CM

## 2022-07-09 NOTE — Telephone Encounter (Signed)
Patient calls nurse line regarding results from head CT. This was performed at a [facility] on 03/23/22. She states that she never heard back regarding these results.   Patient also states that she was supposed to be referred to psychiatry. Per last office note, she declined referral. Patient states that she did not decline and that she would like to proceed with referral to psychiatry.   Forwarding to PCP.   Veronda Prude, RN

## 2022-07-12 NOTE — Telephone Encounter (Signed)
Referral placed to psychiatry.  Review of records in Care Everywhere shows CT head on 03/23/22 at [facility]--  IMPRESSION:  No acute intracranial abnormality.  Electronically Signed by: Francene Finders, MD on 03/24/2022 4:08 PM   Please inform patient of normal CT results and that psychiatry referral has been placed.

## 2022-07-12 NOTE — Addendum Note (Signed)
Addended by: Maury Dus on: 07/12/2022 04:25 PM   Modules accepted: Orders

## 2022-07-13 NOTE — Telephone Encounter (Signed)
Called patient.   Advised of normal head CT scan and advised of Psychiatry referral.   Patient appreciative.

## 2022-08-25 ENCOUNTER — Telehealth (HOSPITAL_COMMUNITY): Payer: Self-pay | Admitting: Psychiatry

## 2022-08-25 ENCOUNTER — Ambulatory Visit (HOSPITAL_BASED_OUTPATIENT_CLINIC_OR_DEPARTMENT_OTHER): Payer: No Typology Code available for payment source | Admitting: Psychiatry

## 2022-08-25 ENCOUNTER — Encounter (HOSPITAL_COMMUNITY): Payer: Self-pay | Admitting: Psychiatry

## 2022-08-25 DIAGNOSIS — F411 Generalized anxiety disorder: Secondary | ICD-10-CM | POA: Diagnosis not present

## 2022-08-25 MED ORDER — HYDROXYZINE HCL 25 MG PO TABS: 25.0000 mg | ORAL_TABLET | Freq: Three times a day (TID) | ORAL | 1 refills | Status: DC | PRN
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
## 2022-08-26 ENCOUNTER — Telehealth (HOSPITAL_COMMUNITY): Payer: Self-pay | Admitting: Psychiatry

## 2022-08-26 ENCOUNTER — Ambulatory Visit (HOSPITAL_COMMUNITY): Payer: Self-pay | Admitting: Psychiatry

## 2022-09-09 ENCOUNTER — Other Ambulatory Visit: Payer: Self-pay | Admitting: Family Medicine

## 2022-09-09 DIAGNOSIS — J440 Chronic obstructive pulmonary disease with acute lower respiratory infection: Secondary | ICD-10-CM

## 2022-09-27 ENCOUNTER — Ambulatory Visit (INDEPENDENT_AMBULATORY_CARE_PROVIDER_SITE_OTHER): Payer: Self-pay | Admitting: Clinical

## 2022-09-27 DIAGNOSIS — F411 Generalized anxiety disorder: Secondary | ICD-10-CM

## 2022-09-27 DIAGNOSIS — F320 Major depressive disorder, single episode, mild: Secondary | ICD-10-CM

## 2022-09-28 ENCOUNTER — Encounter (HOSPITAL_COMMUNITY): Payer: Self-pay

## 2022-09-28 ENCOUNTER — Encounter (HOSPITAL_COMMUNITY): Payer: Self-pay | Admitting: Clinical
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
## 2022-09-30 ENCOUNTER — Ambulatory Visit (HOSPITAL_COMMUNITY): Payer: PRIVATE HEALTH INSURANCE | Admitting: Psychiatry

## 2022-10-04 ENCOUNTER — Ambulatory Visit (HOSPITAL_COMMUNITY): Payer: PRIVATE HEALTH INSURANCE | Admitting: Psychiatry

## 2022-10-11 ENCOUNTER — Ambulatory Visit (INDEPENDENT_AMBULATORY_CARE_PROVIDER_SITE_OTHER): Payer: No Typology Code available for payment source | Admitting: Clinical

## 2022-10-11 DIAGNOSIS — F411 Generalized anxiety disorder: Secondary | ICD-10-CM

## 2022-10-11 NOTE — Progress Notes (Signed)
Monica Sandoval is a 47 y.o. female patient who presented for therapy due to paranoid thoughts and subsequent guilt for actions she took while paranoid.  When patient did not arrive for her appointment, CSW called her via phone and made contact.  She had forgotten the therapy appointment and was at work, could not come at that time.  She was informed of the upcoming appointment on 3/11 at 4pm.  Encounter Diagnosis  Name Primary?   Generalized anxiety disorder Yes     Collaboration of Care: Psychiatrist AEB can read therapy notes  Patient/Guardian was advised Release of Information must be obtained prior to any record release in order to collaborate their care with an outside provider. Patient/Guardian was advised if they have not already done so to contact the registration department to sign all necessary forms in order for [name] to release information regarding their care.   Consent: Patient/Guardian gives verbal consent for treatment and assignment of benefits for services provided during this visit. Patient/Guardian expressed understanding and agreed to proceed.    Maretta Los, LCSW

## 2022-11-01 ENCOUNTER — Ambulatory Visit (HOSPITAL_COMMUNITY): Payer: No Typology Code available for payment source | Admitting: Clinical

## 2022-11-15 ENCOUNTER — Ambulatory Visit (HOSPITAL_COMMUNITY): Payer: Self-pay | Admitting: Psychiatry

## 2022-11-15 NOTE — Progress Notes (Deleted)
BH MD/PA/NP OP Progress Note  11/15/2022 8:53 AM Monica Sandoval  MRN:  [id number]  Visit Diagnosis: No diagnosis found.  Assessment: Monica Sandoval is a 47 y.o. female with a history of paranoia who presents in person to [place] [facility] for initial evaluation on 08/25/2022.    Patient reports symptoms of anhedonia, low mood, fatigue, guilt, excessive worry, difficulty relaxing, racing thoughts, restlessness, and paranoia.  She denies any SI/HI, AVH, delusions, or signs of mania.  Patient has experienced consistent symptoms of mild anhedonia, low mood, fatigue, guilt, and excessive worry for the past several months however the increase in racing thoughts, paranoia, restlessness, and difficulty relaxing are more episodic.  While patient describes her symptoms as paranoia they appear to be more closely related to anxiety symptoms specifically the ruminations that can occur during this.  She denied any disorganization, pervasive distrust and suspiciousness of others, preoccupation about loyalty/trustworthiness, reluctance to confide in others, or perceiving attacks on character reputation not apparent to others.  Patient meets criteria for generalized anxiety disorder with some suspicion of a mild depressive disorder.  Psychotic disorder or paranoid personality disorder seems unlikely at this time though we will continue to monitor.  Romana Juniper presents for follow-up evaluation. Today, 11/15/22, patient reports ***  Plan: - Start Atarax 25 mg TID as needed for anxiety - CMP, CBC, Vit B12, folate, TSH reviewed WNL - Head CT was reviewed and was WNL - Therapy referral on 08-25-2022 - Crisis resources reviewed - Follow up in a month  Chief Complaint: No chief complaint on file.  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   Qualifier: Diagnosis of  By: Samara Snide     TOBACCO DEPENDENCE 10/20/2006   Qualifier: Diagnosis of  By: Samara Snide      Past Surgical History:  Procedure Laterality Date   CHOLECYSTECTOMY  2008    Family Psychiatric History: ***  Family History:  Family History  Problem Relation Age of Onset   Asthma Mother    Rheum arthritis Mother    Arthritis Mother    Diabetes Father    Heart disease Father    Early death Father    Diabetes Paternal Grandmother    Heart disease Paternal Grandmother    Cancer Paternal Grandmother     Social History:  Social History   Socioeconomic History   Marital status: Married    Spouse name: Not on file   Number of children: Not on file   Years of education: Not on file   Highest education level: Not on file  Occupational History   Not on file  Tobacco Use   Smoking status: Every Day    Packs/day: 0.50    Years: 16.00    Additional pack years: 0.00    Total pack years: 8.00    Types: Cigarettes, E-cigarettes    Start date: 2005   Smokeless tobacco: Never   Tobacco comments:    Sts she quit 09/12/16  Vaping Use   Vaping Use: Some days  Substance and Sexual Activity   Alcohol use: No   Drug use: No   Sexual activity: Yes  Birth control/protection: I.U.D.  Other Topics Concern   Not on file  Social History Narrative   Not on file   Social Determinants of Health   Financial Resource Strain: Not on file  Food Insecurity: Not on file  Transportation Needs: Not on file  Physical Activity: Not on file  Stress: Not on file  Social Connections: Not on file    Allergies: No Known Allergies  Current Medications: Current Outpatient Medications  Medication Sig Dispense Refill   albuterol (VENTOLIN HFA) 108 (90 Base) MCG/ACT inhaler INHALE 2 PUFFS BY MOUTH EVERY 4 HOURS AS NEEDED FOR  WHEEZE 18 each 2   esomeprazole (NEXIUM) 40 MG capsule Take 40 mg by mouth daily at 12 noon.     hydrOXYzine (ATARAX) 25 MG tablet Take 1 tablet (25 mg total) by mouth 3 (three) times daily as needed. 90 tablet 1   rosuvastatin (CRESTOR) 10 MG tablet TAKE 1 TABLET BY MOUTH EVERY DAY 90 tablet 0   No current facility-administered medications for this visit.     Musculoskeletal: Strength & Muscle Tone: {desc; muscle tone:32375} Gait & Station: {PE GAIT ED EF:[id number] Patient leans: {Patient Leans:21022755}  Psychiatric Specialty Exam: Review of Systems  There were no vitals taken for this visit.There is no height or weight on file to calculate BMI.  General Appearance: {Appearance:22683}  Eye Contact:  {BHH EYE CONTACT:22684}  Speech:  {Speech:22685}  Volume:  {Volume (PAA):22686}  Mood:  {BHH MOOD:22306}  Affect:  {Affect (PAA):22687}  Thought Process:  {Thought Process (PAA):22688}  Orientation:  {BHH ORIENTATION (PAA):22689}  Thought Content: {Thought Content:22690}   Suicidal Thoughts:  {ST/HT (PAA):22692}  Homicidal Thoughts:  {ST/HT (PAA):22692}  Memory:  {BHH MEMORY:22881}  Judgement:  {Judgement (PAA):22694}  Insight:  {Insight (PAA):22695}  Psychomotor Activity:  {Psychomotor (PAA):22696}  Concentration:  {Concentration:21399}  Recall:  {BHH GOOD/FAIR/POOR:22877}  Fund of Knowledge: {BHH GOOD/FAIR/POOR:22877}  Language: {BHH GOOD/FAIR/POOR:22877}  Akathisia:  {BHH YES OR NO:22294}  Handed:  {Handed:22697}  AIMS (if indicated): {Desc; done/not:10129}  Assets:  {Assets (PAA):22698}  ADL's:  {BHH XO:4411959  Cognition: {chl bhh cognition:304700322}  Sleep:  {BHH 0000000   Metabolic Disorder Labs: Lab Results  Component Value Date   HGBA1C 5.1 05/02/2017   No results found for: "PROLACTIN" Lab Results  Component Value Date   CHOL 183 02/04/2021   TRIG 94 02/04/2021   HDL 48 02/04/2021   CHOLHDL 3.8 02/04/2021   VLDL 17 11/12/2011   LDLCALC  118 (H) 02/04/2021   LDLCALC 114 (H) 10/02/2018   Lab Results  Component Value Date   TSH 1.370 03/09/2022   TSH 3.130 10/02/2018    Therapeutic Level Labs: No results found for: "LITHIUM" No results found for: "VALPROATE" No results found for: "CBMZ"   Screenings: GAD-7    Flowsheet Row Counselor from 09/27/2022 in [place] at [facility]  Total GAD-7 Score 6      PHQ2-9    Flowsheet Row Counselor from 09/27/2022 in [place] at [facility] Visit from 03/09/2022 in [place] Office Visit from 10/12/2021 in [place] Office Visit from 03/24/2020 in [place] Office Visit from 03/04/2020 in [place]  PHQ-2 Total Score 1 2 0 0 0  PHQ-9 Total Score 10 9 2  0 --      Flowsheet Row Counselor from 09/27/2022 in [place] at [facility] ED from 09/22/2021 in [facility]  at [place] No Risk No Risk       Collaboration of Care: Collaboration of Care: [facility] OP Collaboration of [id number]  Patient/Guardian was advised Release of Information must be obtained prior to any record release in order to collaborate their care with an outside provider. Patient/Guardian was advised if they have not already done so to contact the registration department to sign all necessary forms in order for [name] to release information regarding their care.   Consent: Patient/Guardian gives verbal consent for treatment and assignment of benefits for services provided during this visit. Patient/Guardian expressed understanding and agreed to proceed.    Vista Mink, MD 11/15/2022, 8:53 AM

## 2022-12-18 ENCOUNTER — Encounter (HOSPITAL_COMMUNITY): Payer: Self-pay

## 2022-12-18 ENCOUNTER — Ambulatory Visit (HOSPITAL_COMMUNITY)
Admission: EM | Admit: 2022-12-18 | Discharge: 2022-12-18 | Disposition: A | Discharge status: Home / Self Care | Payer: 59 | Attending: Emergency Medicine | Admitting: Emergency Medicine

## 2022-12-18 DIAGNOSIS — K029 Dental caries, unspecified: Secondary | ICD-10-CM | POA: Diagnosis not present

## 2022-12-18 DIAGNOSIS — K047 Periapical abscess without sinus: Secondary | ICD-10-CM

## 2022-12-18 MED ORDER — AMOXICILLIN-POT CLAVULANATE 875-125 MG PO TABS: 1.0000 | ORAL_TABLET | Freq: Two times a day (BID) | ORAL | 0 refills | Status: DC

## 2022-12-18 MED ORDER — KETOROLAC TROMETHAMINE 30 MG/ML IJ SOLN
INTRAMUSCULAR | Status: AC
  Filled 2022-12-18: qty 1

## 2022-12-18 MED ORDER — KETOROLAC TROMETHAMINE 30 MG/ML IJ SOLN
30.0000 mg | Freq: Once | INTRAMUSCULAR | Status: AC
  Administered 2022-12-18: 30 mg via INTRAMUSCULAR

## 2022-12-18 NOTE — Discharge Instructions (Addendum)
We have given you a dose of Toradol today in clinic, do not take any more ibuprofen today.  He can take Tylenol for your pain.  Starting tomorrow you can alternate between Tylenol and ibuprofen every 4-6 hours.  Please start the antibiotics today, take them with food.  Take them until finished.  It is imperative that you follow-up with a dentist for further evaluation.  Below are some resources as well is attached to the back of these discharge papers.  Please return to clinic or seek immediate care if you develop high fever despite medication, or unable to swallow, or have any new concerning symptoms.  [facility] [place] Address: [address] [place], [place], [place] [address] Phone: [phone]  [facility] [phone] extension [phone] [address]  Dr. Lawrence Marseilles [phone] [address]  [place] [phone] [address] [facility].  Rescue mission [phone] extension [address] [place], [place], [address] First come first serve for the first 10 clients.  May do simple extractions only, no wisdom teeth or surgery.  You may try the second for Thursday of the month starting at 6:30 AM.  [employer] You may call the school to see if they are still helping to provide dental care for emergent cases.

## 2022-12-18 NOTE — ED Provider Notes (Signed)
[facility]    CSN: [id number] Arrival date & time: 12/18/22  1744      History   Chief Complaint Chief Complaint  Patient presents with   Dental Problem    HPI Monica Sandoval is a 47 y.o. female.   Patient presents to clinic for dental pain and right lower facial swelling that is gotten worse for the past 3 days.  She did go to a dentist on April 8 and she was placed on amoxicillin 500 mg twice daily for 7 days to cover for infection for a broken tooth.  Reports needing her wisdom teeth out as well.  Her next appointment for potential removal was not scheduled until June, this was their first availability.  Over the past 3 days she has had increasing dental pain and right lower jaw swelling.  She has been taking ibuprofen 800 mg every 6-8 hours for her pain and swelling.  She denies fevers.  Reports jaw tenderness, is unable to eat on her right side due to her open, broken tooth.    The history is provided by the patient and medical records.    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   Qualifier: Diagnosis of  By: Knox Royalty     TOBACCO DEPENDENCE 10/20/2006   Qualifier: Diagnosis of  By: Knox Royalty      Patient Active Problem List   Diagnosis Date Noted   Generalized anxiety disorder 08/25/2022   Headache 03/11/2022   Paranoia (HCC) 03/11/2022   Brain fog 03/10/2022   Healthcare maintenance 10/16/2021   History of DVT (deep vein thrombosis) 03/05/2020   Back pain without radiation 03/05/2020   Routine screening for STI (sexually transmitted infection) 03/05/2020   Ear pain, right 09/24/2019   Hyperlipidemia 10/04/2018   Primary osteoarthritis of left knee 03/22/2018    Past Surgical History:  Procedure Laterality Date   CHOLECYSTECTOMY  2008     OB History   No obstetric history on file.      Home Medications    Prior to Admission medications   Medication Sig Start Date End Date Taking? Authorizing Provider  amoxicillin-clavulanate (AUGMENTIN) 875-125 MG tablet Take 1 tablet by mouth every 12 (twelve) hours. 12/18/22  Yes Rinaldo Ratel, [place] N, FNP  albuterol (VENTOLIN HFA) 108 (90 Base) MCG/ACT inhaler INHALE 2 PUFFS BY MOUTH EVERY 4 HOURS AS NEEDED FOR WHEEZE 09/09/22   Maury Dus, MD  esomeprazole (NEXIUM) 40 MG capsule Take 40 mg by mouth daily at 12 noon.    [provider]  hydrOXYzine (ATARAX) 25 MG tablet Take 1 tablet (25 mg total) by mouth 3 (three) times daily as needed. 08/25/22   Stasia Cavalier, MD  rosuvastatin (CRESTOR) 10 MG tablet TAKE 1 TABLET BY MOUTH EVERY DAY 12/31/21   Maury Dus, MD    Family History Family History  Problem Relation Age of Onset   Asthma Mother    Rheum arthritis Mother    Arthritis Mother    Diabetes Father    Heart disease Father    Early death Father    Diabetes Paternal Grandmother    Heart disease Paternal Grandmother    Cancer Paternal Grandmother     Social History Social History   Tobacco Use  Smoking status: Every Day    Packs/day: 0.50    Years: 16.00    Additional pack years: 0.00    Total pack years: 8.00    Types: Cigarettes, E-cigarettes    Start date: 2005   Smokeless tobacco: Never   Tobacco comments:    Sts she quit 09/12/16  Vaping Use   Vaping Use: Some days  Substance Use Topics   Alcohol use: No   Drug use: No     Allergies   Patient has no known allergies.   Review of Systems Review of Systems  Constitutional:  Negative for chills, fatigue and fever.  HENT:  Positive for dental problem.   Respiratory:  Negative for cough and shortness of breath.   Cardiovascular:  Negative for chest pain.  Gastrointestinal:  Negative for abdominal pain.  Genitourinary:  Negative for dysuria.     Physical Exam Triage Vital  Signs ED Triage Vitals [12/18/22 1757]  Enc Vitals Group     BP 120/79     Pulse Rate 82     Resp 18     Temp 98.5 F (36.9 C)     Temp Source Oral     SpO2 98 %     Weight      Height      Head Circumference      Peak Flow      Pain Score      Pain Loc      Pain Edu?      Excl. in GC?    No data found.  Updated Vital Signs BP 120/79 (BP Location: Left Arm)   Pulse 82   Temp 98.5 F (36.9 C) (Oral)   Resp 18   SpO2 98%   Visual Acuity Right Eye Distance:   Left Eye Distance:   Bilateral Distance:    Right Eye Near:   Left Eye Near:    Bilateral Near:     Physical Exam Vitals and nursing note reviewed.  Constitutional:      Appearance: Normal appearance.  HENT:     Head: Normocephalic and atraumatic.     Right Ear: External ear normal.     Left Ear: External ear normal.     Nose: Nose normal.     Mouth/Throat:     Lips: Pink.     Mouth: Mucous membranes are moist.     Dentition: Abnormal dentition. Dental tenderness, dental caries and dental abscesses present.     Pharynx: No posterior oropharyngeal erythema.      Comments: Open broken molar to right lower jaw with tenderness.  Eyes:     Pupils: Pupils are equal, round, and reactive to light.  Cardiovascular:     Rate and Rhythm: Normal rate and regular rhythm.     Heart sounds: No murmur heard. Pulmonary:     Effort: Pulmonary effort is normal. No respiratory distress.  Lymphadenopathy:     Cervical: Cervical adenopathy present.  Neurological:     General: No focal deficit present.     Mental Status: She is alert and oriented to person, place, and time.  Psychiatric:        Mood and Affect: Mood normal.        Behavior: Behavior normal. Behavior is cooperative.      UC Treatments / Results  Labs (all labs ordered are listed, but only abnormal results are displayed) Labs Reviewed - No data to display  EKG   Radiology No results found.  Procedures Procedures (  including critical care  time)  Medications Ordered in UC Medications  ketorolac (TORADOL) 30 MG/ML injection 30 mg (30 mg Intramuscular Given 12/18/22 1829)    Initial Impression / Assessment and Plan / UC Course  I have reviewed the triage vital signs and the nursing notes.  Pertinent labs & imaging results that were available during my care of the patient were reviewed by me and considered in my medical decision making (see chart for details).  Vitals and triage reviewed, patient is hemodynamically stable.  Afebrile and without tachycardia.  Uvula midline, without purulent drainage or obvious abscess.  Multiple dental caries present with a broken right lower molar.  Will cover with Augmentin for potential dental abscess.  Given dental resources and encouraged to follow-up with a dentist for further evaluation and treatment.  Patient verbalized understanding, no questions at this time.  Return emergency precautions reviewed.    Final Clinical Impressions(s) / UC Diagnoses   Final diagnoses:  Dental caries  Dental abscess     Discharge Instructions      We have given you a dose of Toradol today in clinic, do not take any more ibuprofen today.  He can take Tylenol for your pain.  Starting tomorrow you can alternate between Tylenol and ibuprofen every 4-6 hours.  Please start the antibiotics today, take them with food.  Take them until finished.  It is imperative that you follow-up with a dentist for further evaluation.  Below are some resources as well is attached to the back of these discharge papers.  Please return to clinic or seek immediate care if you develop high fever despite medication, or unable to swallow, or have any new concerning symptoms.  [facility] [place] Address: [address] [place], [place], [place] [address] Phone: [phone]  [facility] [phone] extension [phone] [address]  Dr. Lawrence Marseilles [phone] [address]  [place] [phone] [address] [facility] [place].  [facility] [phone] extension [phone], [place], [place], [address] First come first serve for the first 10 clients.  May do simple extractions only, no wisdom teeth or surgery.  You may try the second for Thursday of the month starting at 6:30 AM.  [facility] You may call the school to see if they are still helping to provide dental care for emergent cases.       ED Prescriptions     Medication Sig Dispense Auth. Provider   amoxicillin-clavulanate (AUGMENTIN) 875-125 MG tablet Take 1 tablet by mouth every 12 (twelve) hours. 14 tablet Edi Gorniak, [place] N, [place]      PDMP not reviewed this encounter.   Orlondo Holycross, [place] N, [place] 12/18/22 [phone]

## 2022-12-18 NOTE — ED Triage Notes (Signed)
Pt presents to the office for dental problem and facial swelling x 2-3 days.

## 2023-01-04 ENCOUNTER — Other Ambulatory Visit: Payer: Self-pay | Admitting: Oral Surgery

## 2023-03-07 ENCOUNTER — Ambulatory Visit: Payer: 59 | Admitting: Family Medicine

## 2023-03-07 ENCOUNTER — Other Ambulatory Visit: Payer: Self-pay

## 2023-03-07 ENCOUNTER — Encounter: Payer: Self-pay | Admitting: Family Medicine

## 2023-03-07 ENCOUNTER — Other Ambulatory Visit: Payer: Self-pay | Admitting: Family Medicine

## 2023-03-07 ENCOUNTER — Ambulatory Visit (HOSPITAL_COMMUNITY): Admission: RE | Admit: 2023-03-07 | Payer: 59 | Source: Ambulatory Visit

## 2023-03-07 VITALS — BP 123/84 | HR 81 | Ht 64.0 in | Wt 207.2 lb

## 2023-03-07 DIAGNOSIS — G4489 Other headache syndrome: Secondary | ICD-10-CM | POA: Diagnosis not present

## 2023-03-07 DIAGNOSIS — R519 Headache, unspecified: Secondary | ICD-10-CM

## 2023-03-07 DIAGNOSIS — J209 Acute bronchitis, unspecified: Secondary | ICD-10-CM

## 2023-03-07 DIAGNOSIS — M79604 Pain in right leg: Secondary | ICD-10-CM | POA: Diagnosis not present

## 2023-03-07 DIAGNOSIS — J440 Chronic obstructive pulmonary disease with acute lower respiratory infection: Secondary | ICD-10-CM

## 2023-03-07 DIAGNOSIS — R300 Dysuria: Secondary | ICD-10-CM

## 2023-03-07 DIAGNOSIS — Z87891 Personal history of nicotine dependence: Secondary | ICD-10-CM

## 2023-03-07 LAB — SEDIMENTATION RATE: Sed Rate: 2 mm/hr (ref 0–32)

## 2023-03-07 MED ORDER — ALBUTEROL SULFATE HFA 108 (90 BASE) MCG/ACT IN AERS: INHALATION_SPRAY | RESPIRATORY_TRACT | 2 refills | Status: DC

## 2023-03-07 MED ORDER — MAGNESIUM OXIDE 140 MG PO CAPS: 140.0000 mg | ORAL_CAPSULE | Freq: Every day | ORAL | 0 refills | Status: DC

## 2023-03-07 NOTE — Assessment & Plan Note (Signed)
Headache without any red flag symptoms.  Temporoparietal headache slightly concerning for giant cell arteritis; however patient is less than 47 years old, does not have pain over temple.  Unlikely cluster headache as there is no lacrimation or rhinorrhea.  Could be migraine or tension headaches with medication overuse. - Sedimentation rate - Headache diary and follow-up in 2 weeks - Mag-Ox daily and Tylenol as needed - Avoid ibuprofen as it is a trigger for her headache

## 2023-03-07 NOTE — Assessment & Plan Note (Signed)
Patient with wheezing on exam.  Currently only has rescue albuterol though with history of tobacco use most likely has some element of COPD and may need daily inhaler. - Refilled albuterol - Schedule for appointment with Dr. Raymondo Band for PFTs

## 2023-03-07 NOTE — Patient Instructions (Signed)
It was wonderful to see you today.  Please bring ALL of your medications with you to every visit.   Today we talked about:  Headache - please keep a headache diary so we can go over it in 2 weeks.   Leg pain - Please go to the ultrasound and I will call you after   Urine - I will follow up with you   Please schedule a follow up in 2 weeks for headache, an appointment for an annual physical and an appointment with Dr. Raymondo Band for breathing studies   Thank you for choosing [facility].   Please call [phone] with any questions about today's appointment.  Please be sure to schedule follow up at the front desk before you leave today.   Lockie Mola, MD  Family Medicine

## 2023-03-07 NOTE — Assessment & Plan Note (Signed)
Dysuria could be due to a UTI though patient seems to have been having symptoms for longer than expected for UTI.  Could also be interstitial cystitis or irritable bladder syndrome as patient does have history of tobacco use. - Follow-up urinalysis

## 2023-03-07 NOTE — Assessment & Plan Note (Signed)
Leg pain reminiscent of previous DVT however no erythema or significant edema.  Given history of DVT without provoking factor patient is high risk.  Does not need to urgently go to the ER but will order DVT ultrasound today. - Continue aspirin for now - Follow-up DVT ultrasound

## 2023-03-07 NOTE — Progress Notes (Signed)
SUBJECTIVE:   CHIEF COMPLAINT / HPI:   Headache -patient mentions that she has been having a right-sided burrow parietal headache for the last month.  Says that this headache is episodic and only ever hurts on the right side temporal region.  Does note some occasional stabbing right-sided eye pain.  Does not sure if this is always associated with her headache.  Denies any lacrimation or rhinorrhea.  Denies seasonal allergies.  Denies any pain over her temple.  Denies any vision changes.  Denies photo or phonophobia.  Denies any trauma to the area.  Says that she was having this headache when she took ibuprofen and ibuprofen often causes her headaches.  Says that after she stopped taking ibuprofen headache continued.  Said that she did have a dental procedure that was needed on her right side but headache continued after dental procedure was completed.  Leg Pain -patient reports significant right-sided lower leg pain.  Says that her leg pain is very similar to when she had a DVT a couple years ago.  Says that she had initially been on Eliquis for her DVT and then was placed on aspirin for "lifetime".  Says that she was told to stop her aspirin for her dental procedure about a couple weeks ago.  She started feeling the pain after her dental procedure.  She started taking her aspirin again after dental procedure.  Denies any edema or erythema.  Says her initial DVT was unprovoked.  Her risk factors include obesity and tobacco use.  She has since quit using cigarettes.  Has not recently been on a long flight or car ride.  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  Dysuria -patient states that over the last few months she has been having dysuria as well as frequency.  Denies any gross hematuria.  Has only 1 sexual partner.  Has never had a UTI that she knows of.  Denies fevers.  Denies back pain.  PERTINENT  PMH / PSH: History of tobacco use, history of DVT, GAD,  HLD  OBJECTIVE:   BP 123/84   Pulse 81   Ht 5\' 4"  (1.626 m)   Wt 207 lb 3.2 oz (94 kg)   SpO2 100%   BMI 35.57 kg/m   General: well appearing, in no acute distress HEENT: No conjunctival injection, nor lacrimation, nor rhinorrhea CV: RRR, radial pulses equal and palpable Resp: Normal work of breathing on room air, wheeze with expiration on bilateral lower lung fields, prolonged expiratory phase Abd: Soft, non tender, non distended  Neuro: Alert & Oriented x 4, PERRLA, EOMI, CN II through XII grossly intact, upper extremity and lower extremity strength 5 out of 5, upper extremity and lower extremity sensation normal No pain over temporal area, or serial pulsation over temporal area Extremities: Right lower leg tender to palpation, only mild early edematous compared to the left, no erythema or warmth, left leg nontender no edema or erythema   ASSESSMENT/PLAN:   Dysuria Assessment & Plan: Dysuria could be due to a UTI though patient seems to have been having symptoms for longer than expected for UTI.  Could also be interstitial cystitis or irritable bladder syndrome as patient does have history of tobacco use. - Follow-up urinalysis  Orders: -     Urinalysis, Routine w reflex microscopic  Other headache syndrome Assessment & Plan: Headache without any red flag symptoms.  Temporoparietal headache slightly concerning for giant cell arteritis; however patient  is less than 85 years old, does not have pain over temple.  Unlikely cluster headache as there is no lacrimation or rhinorrhea.  Could be migraine or tension headaches with medication overuse. - Sedimentation rate - Headache diary and follow-up in 2 weeks - Mag-Ox daily and Tylenol as needed - Avoid ibuprofen as it is a trigger for her headache   Acute leg pain, right Assessment & Plan: Leg pain reminiscent of previous DVT however no erythema or significant edema.  Given history of DVT without provoking factor patient is high  risk.  Does not need to urgently go to the ER but will order DVT ultrasound today. - Continue aspirin for now - Follow-up DVT ultrasound   History of tobacco use Assessment & Plan: Patient with wheezing on exam.  Currently only has rescue albuterol though with history of tobacco use most likely has some element of COPD and may need daily inhaler. - Refilled albuterol - Schedule for appointment with Dr. Raymondo Band for PFTs       Lockie Mola, MD [facility]

## 2023-03-08 LAB — URINALYSIS, ROUTINE W REFLEX MICROSCOPIC
Bilirubin, UA: NEGATIVE
Glucose, UA: NEGATIVE
Ketones, UA: NEGATIVE
Leukocytes,UA: NEGATIVE
Nitrite, UA: NEGATIVE
Protein,UA: NEGATIVE
RBC, UA: NEGATIVE
Specific Gravity, UA: 1.01 (ref 1.005–1.030)
Urobilinogen, Ur: 0.2 mg/dL (ref 0.2–1.0)
pH, UA: 6.5 (ref 5.0–7.5)

## 2023-03-21 ENCOUNTER — Ambulatory Visit: Payer: 59 | Admitting: Student

## 2023-03-21 ENCOUNTER — Encounter: Payer: Self-pay | Admitting: Student

## 2023-03-21 VITALS — BP 127/80 | HR 70 | Resp 16 | Wt 204.8 lb

## 2023-03-21 DIAGNOSIS — E785 Hyperlipidemia, unspecified: Secondary | ICD-10-CM | POA: Diagnosis not present

## 2023-03-21 DIAGNOSIS — Z86718 Personal history of other venous thrombosis and embolism: Secondary | ICD-10-CM | POA: Diagnosis not present

## 2023-03-21 DIAGNOSIS — M79604 Pain in right leg: Secondary | ICD-10-CM | POA: Diagnosis not present

## 2023-03-21 DIAGNOSIS — R519 Headache, unspecified: Secondary | ICD-10-CM

## 2023-03-21 NOTE — Assessment & Plan Note (Addendum)
Renewed right DVT ultrasound order.  Patient left the office with appointment scheduled in hand.  Will follow for ultrasound results.  At this time her heart rate is within normal limits and she does not have other signs suspicious for PE.  Directed patient to go to the ER if she begins developing symptoms.

## 2023-03-21 NOTE — Patient Instructions (Signed)
It was great to see you today!   Today we addressed: Leg pain: I ordered a DVT ultrasound of your right leg. I will call you with the results.  Please go get your eyes checked to see if that's the cause of your headaches, if your eyes are okay I think a referral to neurology would be appropriate.  I am getting several labs today checking your thyroid, screening for diabetes, and blood counts - I will call you when they result.   No future appointments.  Please arrive 15 minutes before your appointment to ensure smooth check in process.    Please call the clinic at [phone] if your symptoms worsen or you have any concerns.  Thank you for allowing me to participate in your care, Dr. Glendale Chard [facility]

## 2023-03-21 NOTE — Assessment & Plan Note (Signed)
Offered to send patient neurology referral.  She would like to have her eyes checked first and if this is normal she would then pursue neurology workup.  Reviewed prior head CT scan with her which showed no acute abnormalities which reassured the patient.

## 2023-03-21 NOTE — Progress Notes (Signed)
    SUBJECTIVE:   CHIEF COMPLAINT / HPI:   Monica Sandoval is a 47 y.o. female  presenting for follow-up of right leg pain and headaches.  She reports having right lower extremity pain similar to her prior DVT last year.  She reports she completed over 52-month course of Eliquis and then transition to aspirin 81 mg twice daily.  She reports 2 and half months ago she began having similar pain in her right lower extremity like her prior DVT.  She denies shortness of breath, chest pain, palpitations, feeling that her heart is racing.  She reports this was addressed at her last visit with her PCP but was lost to follow-up for her ultrasound.  She reports having chronic headaches on and off for many years.  She kept a headache diary as instructed by her PCP which shows that she has headaches nearly every day.  She reports she has not had her eyes checked since she was 47 years old and would like to have an eye doctor evaluate her.  PERTINENT  PMH / PSH: Reviewed and updated   OBJECTIVE:   BP 127/80   Pulse 70   Resp 16   Wt 204 lb 12.8 oz (92.9 kg)   SpO2 97%   BMI 35.15 kg/m   Well-appearing, no acute distress Cardio: Regular rate, regular rhythm, no murmurs on exam. Pulm: Clear, no wheezing, no crackles. No increased work of breathing Abdominal: bowel sounds present, soft, non-tender, non-distended Extremities: no peripheral edema  Neuro: alert and oriented x3, speech normal in content, no facial asymmetry, strength intact and equal bilaterally in UE and LE, pupils equal and reactive to light.  Psych:  Cognition and judgment appear intact. Alert, communicative  and cooperative with normal attention span and concentration. No apparent delusions, illusions, hallucinations      03/21/2023    3:04 PM 03/07/2023    8:57 AM 09/27/2022    4:46 PM  PHQ9 SCORE ONLY  PHQ-9 Total Score 7 7      Information is confidential and restricted. Go to Review Flowsheets to unlock data.       ASSESSMENT/PLAN:   History of DVT (deep vein thrombosis) Renewed right DVT ultrasound order.  Patient left the office with appointment scheduled in hand.  Will follow for ultrasound results.  At this time her heart rate is within normal limits and she does not have other signs suspicious for PE.  Directed patient to go to the ER if she begins developing symptoms.  Headache Offered to send patient neurology referral.  She would like to have her eyes checked first and if this is normal she would then pursue neurology workup.  Reviewed prior head CT scan with her which showed no acute abnormalities which reassured the patient.   Health maintenance: Ordered screening labs including BMP, CBC, TSH, lipid panel, A1c.  Will call patient with results  Glendale Chard, DO [facility]

## 2023-03-22 ENCOUNTER — Ambulatory Visit (HOSPITAL_COMMUNITY)
Admission: RE | Admit: 2023-03-22 | Discharge: 2023-03-22 | Disposition: A | Discharge status: Home / Self Care | Payer: 59 | Source: Ambulatory Visit | Attending: Family Medicine | Admitting: Family Medicine

## 2023-03-22 ENCOUNTER — Telehealth: Payer: Self-pay

## 2023-03-22 DIAGNOSIS — M79604 Pain in right leg: Secondary | ICD-10-CM | POA: Insufficient documentation

## 2023-03-22 DIAGNOSIS — Z86718 Personal history of other venous thrombosis and embolism: Secondary | ICD-10-CM | POA: Diagnosis present

## 2023-03-22 MED ORDER — ROSUVASTATIN CALCIUM 10 MG PO TABS: 10.0000 mg | ORAL_TABLET | Freq: Every day | ORAL | 0 refills | Status: DC

## 2023-03-22 NOTE — Telephone Encounter (Signed)
Called patient to discuss lab results. DVT US was negative for blood clot. Most likely pain in right calf is related to muscle strain - recommended to try Voltaren gel QID PRN.   Lipid panel showed elevated LDL, recommended patient restart her home Crestor 10 mg - sent refill to pharmacy.   All of her other labs looked great. Patient appreciated call and will follow up as needed.   Glendale Chard, DO [facility], PGY-2 03/22/23 3:55 PM

## 2023-03-22 NOTE — Telephone Encounter (Signed)
Received call from Greg at [facility] regarding DVT results. Reports negative DVT in Right leg.   Will forward to ordering provider.   Veronda Prude, RN

## 2023-03-22 NOTE — Progress Notes (Signed)
Right lower extremity venous duplex has been completed. Preliminary results can be found in CV Proc through chart review.  Results were given to [facility] at Dr. Madelaine Etienne office.  03/22/23 10:48 AM Olen Cordial RVT

## 2023-03-24 ENCOUNTER — Telehealth: Payer: Self-pay | Admitting: Family Medicine

## 2023-03-24 NOTE — Telephone Encounter (Signed)
Called patient to follow up regarding headache and labs as well as appointment for DVT. Patient reiterated no DVT and headache has improved. Is waiting to have her eyes checked.

## 2023-10-21 ENCOUNTER — Encounter (HOSPITAL_COMMUNITY): Payer: Self-pay

## 2023-10-21 ENCOUNTER — Ambulatory Visit (HOSPITAL_COMMUNITY)
Admission: EM | Admit: 2023-10-21 | Discharge: 2023-10-21 | Disposition: A | Discharge status: Home / Self Care | Payer: 59

## 2023-10-21 DIAGNOSIS — J019 Acute sinusitis, unspecified: Secondary | ICD-10-CM

## 2023-10-21 DIAGNOSIS — J4521 Mild intermittent asthma with (acute) exacerbation: Secondary | ICD-10-CM | POA: Diagnosis not present

## 2023-10-21 DIAGNOSIS — B9689 Other specified bacterial agents as the cause of diseases classified elsewhere: Secondary | ICD-10-CM

## 2023-10-21 DIAGNOSIS — H66003 Acute suppurative otitis media without spontaneous rupture of ear drum, bilateral: Secondary | ICD-10-CM

## 2023-10-21 MED ORDER — ALBUTEROL SULFATE HFA 108 (90 BASE) MCG/ACT IN AERS: 1.0000 | INHALATION_SPRAY | Freq: Four times a day (QID) | RESPIRATORY_TRACT | 0 refills | Status: DC | PRN

## 2023-10-21 MED ORDER — PREDNISONE 20 MG PO TABS: 40.0000 mg | ORAL_TABLET | Freq: Every day | ORAL | 0 refills | Status: AC

## 2023-10-21 MED ORDER — AMOXICILLIN-POT CLAVULANATE 875-125 MG PO TABS: 1.0000 | ORAL_TABLET | Freq: Two times a day (BID) | ORAL | 0 refills | Status: DC

## 2023-10-21 NOTE — Discharge Instructions (Addendum)
 Take the antibiotics twice daily with food, take your first dose tonight.  Starting tomorrow with breakfast take your prednisone to help treat your asthma exacerbation.  Continue to use your albuterol inhaler every 6 hours as needed for any wheezing or shortness of breath.  Taking 1200 mg of Mucinex daily to help loosen up secretions in addition to drinking at least 64 ounces of water daily.  Sleeping with a humidifier may help as well.  Alternate between 500 mg of Tylenol and 800 mg of ibuprofen every 4-6 hours for pain.  Symptoms should improve with these medications, if no improvement please follow-up with your primary care provider or return to clinic for reevaluation.

## 2023-10-21 NOTE — ED Provider Notes (Signed)
 [facility]    CSN: [id number] Arrival date & time: 10/21/23  1557      History   Chief Complaint Chief Complaint  Patient presents with   Cough   Hearing Problem    HPI Monica Sandoval is a 48 y.o. female.   Patient presents to clinic over concerns of not feeling well for the past 4 weeks.  Initially her symptoms started with hot flashes, congestion and rhinorrhea.  On the second week she developed a right-sided headache.  Week 3 she developed sinus pressure and continued headache.  Over the past 3 days she has had a cough, sore throat, chest congestion, shortness of breath, wheezing, sinus pressure and bilateral ear pain.  The reason she came into clinic today is her right ear started hurting, throbbing and pounding last night, it did keep her up.  She has been taking NyQuil, nasal spray, Goody powder, Advil and Tylenol.  Last dose was Advil around 10 AM today.  Does have a history of asthma, has been using her albuterol inhaler more frequently than usual.  The history is provided by the patient and medical records.  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   Qualifier: Diagnosis of  By: Knox Royalty     TOBACCO DEPENDENCE 10/20/2006   Qualifier: Diagnosis of  By: Knox Royalty      Patient Active Problem List   Diagnosis Date Noted   Acute leg pain, right 03/07/2023   Generalized anxiety disorder 08/25/2022   Headache 03/11/2022   Paranoia (HCC) 03/11/2022   Brain fog 03/10/2022   Healthcare maintenance 10/16/2021   History of DVT (deep vein thrombosis) 03/05/2020   Back pain without radiation 03/05/2020   Routine screening for STI (sexually transmitted infection) 03/05/2020   Ear pain, right 09/24/2019   Hyperlipidemia 10/04/2018   Primary  osteoarthritis of left knee 03/22/2018   Dysuria 08/31/2014   History of tobacco use 10/20/2006    Past Surgical History:  Procedure Laterality Date   CHOLECYSTECTOMY  2008    OB History   No obstetric history on file.      Home Medications    Prior to Admission medications   Medication Sig Start Date End Date Taking? Authorizing Provider  albuterol (VENTOLIN HFA) 108 (90 Base) MCG/ACT inhaler INHALE 2 PUFFS BY MOUTH EVERY 4 HOURS AS NEEDED FOR WHEEZE 03/07/23  Yes Lockie Mola, MD  albuterol (VENTOLIN HFA) 108 (90 Base) MCG/ACT inhaler Inhale 1-2 puffs into the lungs every 6 (six) hours as needed for wheezing or shortness of breath. 10/21/23  Yes Rinaldo Ratel, [place] N, FNP  amoxicillin-clavulanate (AUGMENTIN) 875-125 MG tablet Take 1 tablet by mouth every 12 (twelve) hours. 10/21/23  Yes Rinaldo Ratel, [place] N, FNP  esomeprazole (NEXIUM) 40 MG capsule Take 40 mg by mouth daily at 12 noon.   Yes [provider]  predniSONE (DELTASONE) 20 MG tablet Take 2 tablets (40 mg total) by mouth daily for 5 days. 10/21/23 10/26/23 Yes Rinaldo Ratel, [place] N, FNP  aspirin 81 MG chewable tablet Chew by mouth daily.    [provider]    Family History Family History  Problem Relation Age of Onset   Asthma Mother    Rheum arthritis Mother    Arthritis  Mother    Diabetes Father    Heart disease Father    Early death Father    Diabetes Paternal Grandmother    Heart disease Paternal Grandmother    Cancer Paternal Grandmother     Social History Social History   Tobacco Use   Smoking status: Former    Current packs/day: 0.00    Average packs/day: 0.5 packs/day for 13.0 years (6.5 ttl pk-yrs)    Types: Cigarettes, E-cigarettes    Start date: 2005    Quit date: 09/09/2016    Years since quitting: 7.1   Smokeless tobacco: Never   Tobacco comments:    Sts she quit 09/12/16  Vaping Use   Vaping status: Every Day  Substance Use Topics   Alcohol use: No   Drug use: No      Allergies   Patient has no known allergies.   Review of Systems Review of Systems  Per HPI   Physical Exam Triage Vital Signs ED Triage Vitals  Encounter Vitals Group     BP 10/21/23 1737 127/82     Systolic BP Percentile --      Diastolic BP Percentile --      Pulse Rate 10/21/23 1737 81     Resp 10/21/23 1737 18     Temp 10/21/23 1737 99.3 F (37.4 C)     Temp Source 10/21/23 1737 Oral     SpO2 10/21/23 1737 96 %     Weight --      Height --      Head Circumference --      Peak Flow --      Pain Score 10/21/23 1734 0     Pain Loc --      Pain Education --      Exclude from Growth Chart --    No data found.  Updated Vital Signs BP 127/82 (BP Location: Left Arm)   Pulse 81   Temp 99.3 F (37.4 C) (Oral)   Resp 18   SpO2 96%   Visual Acuity Right Eye Distance:   Left Eye Distance:   Bilateral Distance:    Right Eye Near:   Left Eye Near:    Bilateral Near:     Physical Exam Vitals and nursing note reviewed.  Constitutional:      Appearance: Normal appearance.  HENT:     Head: Normocephalic and atraumatic.     Right Ear: Tympanic membrane is erythematous and bulging.     Left Ear: Tympanic membrane is erythematous and bulging.     Nose: Congestion and rhinorrhea present.     Mouth/Throat:     Mouth: Mucous membranes are moist.     Pharynx: Posterior oropharyngeal erythema present.  Eyes:     Conjunctiva/sclera: Conjunctivae normal.  Cardiovascular:     Rate and Rhythm: Normal rate and regular rhythm.     Heart sounds: Normal heart sounds. No murmur heard. Pulmonary:     Effort: Pulmonary effort is normal.     Breath sounds: Decreased air movement present. Wheezing and rhonchi present.  Musculoskeletal:        General: Normal range of motion.  Skin:    General: Skin is warm and dry.  Neurological:     General: No focal deficit present.     Mental Status: She is alert and oriented to person, place, and time.  Psychiatric:        Mood  and Affect: Mood normal.        Behavior:  Behavior normal. Behavior is cooperative.      UC Treatments / Results  Labs (all labs ordered are listed, but only abnormal results are displayed) Labs Reviewed - No data to display  EKG   Radiology No results found.  Procedures Procedures (including critical care time)  Medications Ordered in UC Medications - No data to display  Initial Impression / Assessment and Plan / UC Course  I have reviewed the triage vital signs and the nursing notes.  Pertinent labs & imaging results that were available during my care of the patient were reviewed by me and considered in my medical decision making (see chart for details).  Vitals and triage reviewed, patient is hemodynamically stable.  Heart with regular rate and rhythm, lungs with overall diminished air movement, diffuse wheezing and rhonchi.  Endorses shortness of breath and wheezing, will treat with prednisone burst for asthma exacerbation and refill albuterol inhaler.  Bilateral tympanic membranes with purulent fluid, erythematous and bulging, Augmentin will cover for acute bacterial sinusitis, pneumonia and otitis media.  Symptomatic management for fever and pain discussed.  Plan of care, follow-up care return precautions given, no questions at this time.     Final Clinical Impressions(s) / UC Diagnoses   Final diagnoses:  Mild intermittent asthma with acute exacerbation  Acute suppurative otitis media of both ears without spontaneous rupture of tympanic membranes, recurrence not specified  Acute bacterial sinusitis     Discharge Instructions      Take the antibiotics twice daily with food, take your first dose tonight.  Starting tomorrow with breakfast take your prednisone to help treat your asthma exacerbation.  Continue to use your albuterol inhaler every 6 hours as needed for any wheezing or shortness of breath.  Taking 1200 mg of Mucinex daily to help loosen up secretions in  addition to drinking at least 64 ounces of water daily.  Sleeping with a humidifier may help as well.  Alternate between 500 mg of Tylenol and 800 mg of ibuprofen every 4-6 hours for pain.  Symptoms should improve with these medications, if no improvement please follow-up with your primary care provider or return to clinic for reevaluation.    ED Prescriptions     Medication Sig Dispense Auth. Provider   predniSONE (DELTASONE) 20 MG tablet Take 2 tablets (40 mg total) by mouth daily for 5 days. 10 tablet Rinaldo Ratel, [place] N, FNP   amoxicillin-clavulanate (AUGMENTIN) 875-125 MG tablet Take 1 tablet by mouth every 12 (twelve) hours. 14 tablet Rinaldo Ratel, [place] N, [place]   albuterol (VENTOLIN HFA) 108 (90 Base) MCG/ACT inhaler Inhale 1-2 puffs into the lungs every 6 (six) hours as needed for wheezing or shortness of breath. 18 g Nikka Hakimian, [place] N, [place]      PDMP not reviewed this encounter.   Darreon Lutes, [place] N, [place] 10/21/23 1806

## 2023-10-21 NOTE — ED Triage Notes (Signed)
 Pt states she has been sick for four weeks. Week one she had a runny nose. Week two she had nasal congestion and a right sided headache. Week three she has sinus pressure and headaches.  This week she has had a cough for the past 3 days, sore throat, chest congestion, shortness of breath, sinus pressure, and bilateral ear pressure and muffled hearing.   Home Interventions: Nyquil, Prescription nasal spray, Goodie Powder, Advil, and Tylenol  Last dose: Advil at 1000 today

## 2023-10-28 ENCOUNTER — Ambulatory Visit (HOSPITAL_COMMUNITY)

## 2023-10-28 ENCOUNTER — Encounter (HOSPITAL_COMMUNITY): Payer: Self-pay

## 2023-10-28 ENCOUNTER — Ambulatory Visit (HOSPITAL_COMMUNITY)
Admission: EM | Admit: 2023-10-28 | Discharge: 2023-10-28 | Disposition: A | Discharge status: Home / Self Care | Attending: Family Medicine | Admitting: Family Medicine

## 2023-10-28 DIAGNOSIS — R072 Precordial pain: Secondary | ICD-10-CM

## 2023-10-28 MED ORDER — PREDNISONE 20 MG PO TABS: ORAL_TABLET | ORAL | 0 refills | Status: DC

## 2023-10-28 NOTE — Discharge Instructions (Signed)
 By my review there was no abnormality of her chest x-ray.  The radiologist will also read your x-ray, and if their interpretation differs significantly from mine, and the management of your condition would change, we will call you.  Take prednisone 20 mg--3 tabs daily x3 days, then 2 tabs daily x3 days, then 1 tab daily x3 days, then one half tab daily x3 days, then stop

## 2023-10-28 NOTE — ED Provider Notes (Signed)
 [facility]    CSN: [id number] Arrival date & time: 10/28/23  1718      History   Chief Complaint No chief complaint on file.   HPI Monica Sandoval is a 48 y.o. female.   HPI Here chest pain.  She was seen here on February 28, at which time she had had about 4 weeks of nasal congestion and ear pain and sinus pressure and cough and wheezing.  She was treated with Augmentin for an ear infection and sinusitis.  She also received a 5-day burst of prednisone.  She does feel that the treatments have been helpful and she improved a lot.  She now does not have any ear pain and her sinus pressure and congestion have improved a lot. When she can finish the prednisone, though, she started having some soreness and pains in her chest and some in her muscles also.  No fever and no chills  She has 1 day of Augmentin left.  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   Qualifier: Diagnosis of  By: Knox Royalty     TOBACCO DEPENDENCE 10/20/2006   Qualifier: Diagnosis of  By: Knox Royalty      Patient Active Problem List   Diagnosis Date Noted   Acute leg pain, right 03/07/2023   Generalized anxiety disorder 08/25/2022   Headache 03/11/2022   Paranoia (HCC) 03/11/2022   Brain fog 03/10/2022   Healthcare maintenance 10/16/2021   History of DVT (deep vein thrombosis) 03/05/2020   Back pain without radiation 03/05/2020   Routine screening for STI (sexually transmitted infection) 03/05/2020   Ear pain, right 09/24/2019   Hyperlipidemia 10/04/2018   Primary osteoarthritis of left knee 03/22/2018   Dysuria 08/31/2014   History of tobacco use 10/20/2006    Past Surgical History:  Procedure Laterality Date   CHOLECYSTECTOMY  2008    OB History   No obstetric history on  file.      Home Medications    Prior to Admission medications   Medication Sig Start Date End Date Taking? Authorizing Provider  albuterol (VENTOLIN HFA) 108 (90 Base) MCG/ACT inhaler INHALE 2 PUFFS BY MOUTH EVERY 4 HOURS AS NEEDED FOR WHEEZE 03/07/23  Yes Lockie Mola, MD  albuterol (VENTOLIN HFA) 108 (90 Base) MCG/ACT inhaler Inhale 1-2 puffs into the lungs every 6 (six) hours as needed for wheezing or shortness of breath. 10/21/23  Yes Rinaldo Ratel, [place] N, FNP  amoxicillin-clavulanate (AUGMENTIN) 875-125 MG tablet Take 1 tablet by mouth every 12 (twelve) hours. 10/21/23  Yes Rinaldo Ratel, [place] N, FNP  esomeprazole (NEXIUM) 40 MG capsule Take 40 mg by mouth daily at 12 noon.   Yes [provider]  predniSONE (DELTASONE) 20 MG tablet 3 tabs daily x3 days, then 2 tabs daily x3 days, then 1 tab daily x3 days, then one half tab daily x3 days, then stop 10/28/23  Yes Earlena Werst, Janace Aris, MD  aspirin 81 MG chewable tablet Chew by mouth daily.    [provider]    Family History Family History  Problem Relation Age of Onset   Asthma Mother    Rheum arthritis Mother    Arthritis Mother    Diabetes Father    Heart disease Father    Early death  Father    Diabetes Paternal Grandmother    Heart disease Paternal Grandmother    Cancer Paternal Grandmother     Social History Social History   Tobacco Use   Smoking status: Former    Current packs/day: 0.00    Average packs/day: 0.5 packs/day for 13.0 years (6.5 ttl pk-yrs)    Types: Cigarettes, E-cigarettes    Start date: 2005    Quit date: 09/09/2016    Years since quitting: 7.1   Smokeless tobacco: Never   Tobacco comments:    Sts she quit 09/12/16  Vaping Use   Vaping status: Every Day  Substance Use Topics   Alcohol use: No   Drug use: No     Allergies   Patient has no known allergies.   Review of Systems Review of Systems   Physical Exam Triage Vital Signs ED Triage Vitals [10/28/23 1744]  Encounter  Vitals Group     BP 121/84     Systolic BP Percentile      Diastolic BP Percentile      Pulse Rate 97     Resp 16     Temp 98.3 F (36.8 C)     Temp Source Oral     SpO2 98 %     Weight      Height      Head Circumference      Peak Flow      Pain Score 0     Pain Loc      Pain Education      Exclude from Growth Chart    No data found.  Updated Vital Signs BP 121/84 (BP Location: Right Arm)   Pulse 97   Temp 98.3 F (36.8 C) (Oral)   Resp 16   SpO2 98%   Visual Acuity Right Eye Distance:   Left Eye Distance:   Bilateral Distance:    Right Eye Near:   Left Eye Near:    Bilateral Near:     Physical Exam Vitals reviewed.  Constitutional:      General: She is not in acute distress.    Appearance: She is not ill-appearing, toxic-appearing or diaphoretic.  HENT:     Right Ear: Tympanic membrane and ear canal normal.     Left Ear: Tympanic membrane and ear canal normal.     Nose: Nose normal.     Mouth/Throat:     Mouth: Mucous membranes are moist.     Comments: There is mild erythema of the posterior oropharynx. Eyes:     Extraocular Movements: Extraocular movements intact.     Conjunctiva/sclera: Conjunctivae normal.     Pupils: Pupils are equal, round, and reactive to light.  Cardiovascular:     Rate and Rhythm: Normal rate and regular rhythm.     Heart sounds: No murmur heard. Pulmonary:     Effort: Pulmonary effort is normal. No respiratory distress.     Breath sounds: Normal breath sounds. No stridor. No wheezing, rhonchi or rales.  Musculoskeletal:     Cervical back: Neck supple.  Lymphadenopathy:     Cervical: No cervical adenopathy.  Skin:    Capillary Refill: Capillary refill takes less than 2 seconds.     Coloration: Skin is not jaundiced or pale.  Neurological:     General: No focal deficit present.     Mental Status: She is alert and oriented to person, place, and time.  Psychiatric:        Behavior: Behavior normal.  UC Treatments  / Results  Labs (all labs ordered are listed, but only abnormal results are displayed) Labs Reviewed - No data to display  EKG   Radiology No results found.  Procedures Procedures (including critical care time)  Medications Ordered in UC Medications - No data to display  Initial Impression / Assessment and Plan / UC Course  I have reviewed the triage vital signs and the nursing notes.  Pertinent labs & imaging results that were available during my care of the patient were reviewed by me and considered in my medical decision making (see chart for details).     By my review there is no acute abnormality on her chest x-ray.   a longer prednisone taper is sent in to help [name] what appears to be some continued trouble with her asthma and some chest wall inflammation.  I have asked her to follow-up with primary care Final Clinical Impressions(s) / UC Diagnoses   Final diagnoses:  Precordial pain     Discharge Instructions      By my review there was no abnormality of her chest x-ray.  The radiologist will also read your x-ray, and if their interpretation differs significantly from mine, and the management of your condition would change, we will call you.  Take prednisone 20 mg--3 tabs daily x3 days, then 2 tabs daily x3 days, then 1 tab daily x3 days, then one half tab daily x3 days, then stop       ED Prescriptions     Medication Sig Dispense Auth. Provider   predniSONE (DELTASONE) 20 MG tablet 3 tabs daily x3 days, then 2 tabs daily x3 days, then 1 tab daily x3 days, then one half tab daily x3 days, then stop 20 tablet Motty Borin, Janace Aris, MD      PDMP not reviewed this encounter.   Zenia Resides, MD 10/28/23 Windell Moment

## 2023-10-28 NOTE — ED Triage Notes (Addendum)
 Pt states she has been sick for 6 weeks. She was seen here and prescribed Amoxicillin. Patient stated she has one more dose left. Cough is not better.

## 2024-03-01 ENCOUNTER — Encounter (HOSPITAL_COMMUNITY): Payer: Self-pay

## 2024-03-01 ENCOUNTER — Ambulatory Visit (HOSPITAL_COMMUNITY)
Admission: EM | Admit: 2024-03-01 | Discharge: 2024-03-01 | Disposition: A | Discharge status: Home / Self Care | Attending: Nurse Practitioner | Admitting: Nurse Practitioner

## 2024-03-01 DIAGNOSIS — J209 Acute bronchitis, unspecified: Secondary | ICD-10-CM | POA: Diagnosis not present

## 2024-03-01 DIAGNOSIS — J440 Chronic obstructive pulmonary disease with acute lower respiratory infection: Secondary | ICD-10-CM

## 2024-03-01 LAB — POC COVID19/FLU A&B COMBO
Covid Antigen, POC: NEGATIVE
Influenza A Antigen, POC: NEGATIVE
Influenza B Antigen, POC: NEGATIVE

## 2024-03-01 LAB — POCT RAPID STREP A (OFFICE): Rapid Strep A Screen: NEGATIVE

## 2024-03-01 MED ORDER — ALBUTEROL SULFATE (2.5 MG/3ML) 0.083% IN NEBU
INHALATION_SOLUTION | RESPIRATORY_TRACT | Status: AC
  Filled 2024-03-01: qty 3

## 2024-03-01 MED ORDER — METHYLPREDNISOLONE 4 MG PO TBPK: ORAL_TABLET | ORAL | 0 refills | Status: DC

## 2024-03-01 MED ORDER — ALBUTEROL SULFATE HFA 108 (90 BASE) MCG/ACT IN AERS: 1.0000 | INHALATION_SPRAY | Freq: Four times a day (QID) | RESPIRATORY_TRACT | 0 refills | Status: DC | PRN

## 2024-03-01 MED ORDER — MUCINEX DM MAXIMUM STRENGTH 60-1200 MG PO TB12: 1.0000 | ORAL_TABLET | Freq: Two times a day (BID) | ORAL | 0 refills | Status: DC

## 2024-03-01 MED ORDER — ALBUTEROL SULFATE (2.5 MG/3ML) 0.083% IN NEBU
2.5000 mg | INHALATION_SOLUTION | Freq: Once | RESPIRATORY_TRACT | Status: AC
  Administered 2024-03-01: 2.5 mg via RESPIRATORY_TRACT

## 2024-03-01 MED ORDER — PROMETHAZINE-DM 6.25-15 MG/5ML PO SYRP
10.0000 mL | ORAL_SOLUTION | Freq: Four times a day (QID) | ORAL | 0 refills | Status: AC | PRN
Start: 2024-03-01 — End: ?

## 2024-03-01 MED ORDER — CETIRIZINE-PSEUDOEPHEDRINE ER 5-120 MG PO TB12: 1.0000 | ORAL_TABLET | Freq: Every day | ORAL | 0 refills | Status: AC

## 2024-03-01 MED ORDER — AZITHROMYCIN 500 MG PO TABS: 500.0000 mg | ORAL_TABLET | Freq: Every day | ORAL | 0 refills | Status: AC

## 2024-03-01 NOTE — ED Triage Notes (Signed)
 Chief Complaint: cough, SOB, fever, and sore throat. States her back is also hurting. Patient does have asthma.   Sick exposure: Yes- States her grandchild had the Endo Virus last week.   Onset: this past Monday   Prescriptions or OTC medications tried: Yes- Nyquil   with little relief  New foods, medications, or products: No  Recent Travel: No

## 2024-03-01 NOTE — ED Provider Notes (Signed)
 [facility]    CSN: [id number] Arrival date & time: 03/01/24  1444      History   Chief Complaint Chief Complaint  Patient presents with   Cough    HPI Monica Sandoval is a 48 y.o. female.   Discussed the use of AI scribe software for clinical note transcription with the patient, who gave verbal consent to proceed.   Patient with a history of asthma presents with cough, fever, shortness of breath, back and chest pain, and wheezing. Symptoms started on Monday, following exposure to a grandchild with adenovirus over a week ago.  The patient reports experiencing chills, body aches, and fever on Tuesday, though she did not measure her temperature. She has been taking Nyquil for the past three days without significant relief. Yesterday, she began using an over-the-counter inhaler due to running out of her usual prescription. The patient also mentions having diarrhea last week and currently experiencing a headache. Her throat feels irritated from coughing but is not swollen. The cough has been causing back pain, which is attributed to muscle irritation.  The patient reports vaping but denies any allergies. She has not experienced relief from the over-the-counter medications taken thus far. The cough and associated symptoms are impacting her daily functioning, though specific details about this impact are not provided.     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   Qualifier: Diagnosis of  By: Manford Longs     TOBACCO DEPENDENCE 10/20/2006   Qualifier: Diagnosis of  By: Manford Longs      Patient Active Problem List   Diagnosis Date Noted   Acute leg pain, right 03/07/2023   Generalized anxiety disorder 08/25/2022   Headache 03/11/2022   Paranoia (HCC) 03/11/2022    Brain fog 03/10/2022   Healthcare maintenance 10/16/2021   History of DVT (deep vein thrombosis) 03/05/2020   Back pain without radiation 03/05/2020   Routine screening for STI (sexually transmitted infection) 03/05/2020   Ear pain, right 09/24/2019   Hyperlipidemia 10/04/2018   Primary osteoarthritis of left knee 03/22/2018   Dysuria 08/31/2014   History of tobacco use 10/20/2006    Past Surgical History:  Procedure Laterality Date   CHOLECYSTECTOMY  2008    OB History   No obstetric history on file.      Home Medications    Prior to Admission medications   Medication Sig Start Date End Date Taking? Authorizing Provider  albuterol  (VENTOLIN  HFA) 108 (90 Base) MCG/ACT inhaler INHALE 2 PUFFS BY MOUTH EVERY 4 HOURS AS NEEDED FOR WHEEZE 03/07/23  Yes Nicholas Bar, MD  azithromycin  (ZITHROMAX ) 500 MG tablet Take 1 tablet (500 mg total) by mouth daily for 5 days. 03/01/24 03/06/24 Yes Iola Lukes, FNP  cetirizine -pseudoephedrine  (ZYRTEC -D) 5-120 MG tablet Take 1 tablet by mouth daily with breakfast for 10 days. 03/01/24 03/11/24 Yes Iola Lukes, FNP  Dextromethorphan-guaiFENesin  (MUCINEX  DM MAXIMUM STRENGTH) 60-1200 MG TB12 Take 1 tablet by mouth 2 (two) times daily. 03/01/24  Yes Iola Lukes, FNP  esomeprazole (NEXIUM) 40 MG capsule Take 40 mg by mouth daily at 12 noon.   Yes [provider]  methylPREDNISolone  (MEDROL  DOSEPAK) 4 MG TBPK tablet Take as directed 03/01/24  Yes Iola Lukes, FNP  promethazine -dextromethorphan (PROMETHAZINE -DM) 6.25-15 MG/5ML syrup  Take 10 mLs by mouth every 6 (six) hours as needed for cough. 03/01/24  Yes Iola Lukes, FNP  albuterol  (VENTOLIN  HFA) 108 (90 Base) MCG/ACT inhaler Inhale 1-2 puffs into the lungs every 6 (six) hours as needed for wheezing or shortness of breath. 03/01/24   Iola Lukes, FNP    Family History Family History  Problem Relation Age of Onset   Asthma Mother    Rheum arthritis Mother     Arthritis Mother    Diabetes Father    Heart disease Father    Early death Father    Diabetes Paternal Grandmother    Heart disease Paternal Grandmother    Cancer Paternal Grandmother     Social History Social History   Tobacco Use   Smoking status: Former    Current packs/day: 0.00    Average packs/day: 0.5 packs/day for 13.0 years (6.5 ttl pk-yrs)    Types: Cigarettes, E-cigarettes    Start date: 2005    Quit date: 09/09/2016    Years since quitting: 7.4   Smokeless tobacco: Never   Tobacco comments:    Sts she quit 09/12/16  Vaping Use   Vaping status: Every Day  Substance Use Topics   Alcohol use: No   Drug use: No     Allergies   Patient has no known allergies.   Review of Systems Review of Systems  Constitutional:  Positive for chills. Negative for fever (possibly ran a fever 2 days ago).  HENT:  Positive for congestion, rhinorrhea and sore throat.   Respiratory:  Positive for cough, shortness of breath and wheezing.   Cardiovascular:  Positive for chest pain.  Gastrointestinal:  Positive for diarrhea. Negative for nausea and vomiting.  Musculoskeletal:  Positive for back pain and myalgias.  Neurological:  Positive for headaches.  All other systems reviewed and are negative.    Physical Exam Triage Vital Signs ED Triage Vitals  Encounter Vitals Group     BP 03/01/24 1624 116/80     Girls Systolic BP Percentile --      Girls Diastolic BP Percentile --      Boys Systolic BP Percentile --      Boys Diastolic BP Percentile --      Pulse Rate 03/01/24 1624 64     Resp 03/01/24 1624 20     Temp 03/01/24 1624 98.2 F (36.8 C)     Temp Source 03/01/24 1624 Oral     SpO2 03/01/24 1624 98 %     Weight 03/01/24 1624 210 lb (95.3 kg)     Height 03/01/24 1624 5' 4 (1.626 m)     Head Circumference --      Peak Flow --      Pain Score 03/01/24 1622 7     Pain Loc --      Pain Education --      Exclude from Growth Chart --    No data found.  Updated  Vital Signs BP 116/80 (BP Location: Left Arm)   Pulse 64   Temp 98.2 F (36.8 C) (Oral)   Resp 20   Ht 5' 4 (1.626 m)   Wt 210 lb (95.3 kg)   LMP  (LMP Unknown)   SpO2 98%   BMI 36.05 kg/m   Visual Acuity Right Eye Distance:   Left Eye Distance:   Bilateral Distance:    Right Eye Near:   Left Eye Near:    Bilateral Near:     Physical Exam Vitals reviewed.  Constitutional:      General: She is awake. She is not in acute distress.    Appearance: Normal appearance. She is well-developed. She is not ill-appearing, toxic-appearing or diaphoretic.  HENT:     Head: Normocephalic.     Right Ear: Tympanic membrane, ear canal and external ear normal. No drainage, swelling or tenderness. No middle ear effusion. Tympanic membrane is not erythematous.     Left Ear: Tympanic membrane, ear canal and external ear normal. No drainage, swelling or tenderness.  No middle ear effusion. Tympanic membrane is not erythematous.     Nose: Congestion present. No rhinorrhea.     Mouth/Throat:     Lips: Pink.     Mouth: Mucous membranes are moist.     Pharynx: No pharyngeal swelling, oropharyngeal exudate, posterior oropharyngeal erythema or uvula swelling.     Tonsils: No tonsillar exudate or tonsillar abscesses.  Eyes:     General: Vision grossly intact.     Conjunctiva/sclera: Conjunctivae normal.  Cardiovascular:     Rate and Rhythm: Normal rate.     Heart sounds: Normal heart sounds.  Pulmonary:     Effort: Pulmonary effort is normal. No tachypnea or respiratory distress.     Breath sounds: Normal breath sounds and air entry. No decreased air movement. No decreased breath sounds.  Musculoskeletal:        General: Normal range of motion.     Cervical back: Normal range of motion and neck supple.  Lymphadenopathy:     Cervical: No cervical adenopathy.  Skin:    General: Skin is warm and dry.  Neurological:     General: No focal deficit present.     Mental Status: She is alert and  oriented to person, place, and time.  Psychiatric:        Behavior: Behavior is cooperative.      UC Treatments / Results  Labs (all labs ordered are listed, but only abnormal results are displayed) Labs Reviewed  POC COVID19/FLU A&B COMBO - Normal  POCT RAPID STREP A (OFFICE) - Normal    EKG   Radiology No results found.  Procedures Procedures (including critical care time)  Medications Ordered in UC Medications  albuterol  (PROVENTIL ) (2.5 MG/3ML) 0.083% nebulizer solution 2.5 mg (2.5 mg Nebulization Given 03/01/24 1810)    Initial Impression / Assessment and Plan / UC Course  I have reviewed the triage vital signs and the nursing notes.  Pertinent labs & imaging results that were available during my care of the patient were reviewed by me and considered in my medical decision making (see chart for details).     Patient presents with cough, subjective fever, shortness of breath, chest pain, and wheezing since Monday following exposure to a grandchild with adenovirus. She has a history of asthma and has been using an over-the-counter inhaler after running out of her prescribed one. Physical exam reveals good air movement with no hypoxia; oxygen saturation is 98%. Flu and strep tests were negative. Symptoms and history are consistent with acute bronchitis, possibly viral or bacterial in nature. Azithromycin  was prescribed for potential bacterial infection, along with a Medrol  dose pack to reduce airway inflammation. Additional medications include Mucinex  DM twice daily for mucus relief, Promethazine  DM as needed for cough, and an albuterol  inhaler to replace the current OTC version. Supportive care includes increased fluid intake, use of a humidifier at night, and elevating the head of the bed to reduce nighttime coughing.  The patient also reports back pain, which  is likely secondary to muscle strain from persistent coughing. Symptoms are expected to improve as the cough resolves  and with the anti-inflammatory effects of the steroid taper. Patient was advised to follow up with her primary care provider if symptoms do not improve within a few days or worsen. Emergency care is advised for difficulty breathing, chest pain that worsens or becomes severe, high fever, confusion, or decreased oxygen saturation.  Final Clinical Impressions(s) / UC Diagnoses   Final diagnoses:  Acute bronchitis, unspecified organism     Discharge Instructions      Your symptoms are most consistent with acute bronchitis, likely viral but possibly with a bacterial component. You were prescribed azithromycin  and a Medrol  dose pack to reduce airway inflammation. You were also given Mucinex  DM to take twice daily to help with mucus, Promethazine  DM as needed for cough, and a prescription albuterol  inhaler to replace the over-the-counter version you've been using. Use the albuterol  inhaler as directed for wheezing or shortness of breath. Increase your fluid intake, use a humidifier at night, and elevate the head of your bed to reduce coughing while sleeping.  You also reported back pain, which is likely due to muscle strain from frequent coughing. This should improve as your cough resolves and with the use of the prescribed steroid.  Follow up with your primary care provider if your symptoms do not improve within a few days or begin to worsen.   Seek emergency care if you experience severe or worsening shortness of breath, chest pain, high fever, confusion, or any signs of respiratory distress such as difficulty speaking in full sentences or low oxygen levels.      ED Prescriptions     Medication Sig Dispense Auth. Provider   albuterol  (VENTOLIN  HFA) 108 (90 Base) MCG/ACT inhaler Inhale 1-2 puffs into the lungs every 6 (six) hours as needed for wheezing or shortness of breath. 8.5 g Iola Lukes, FNP   azithromycin  (ZITHROMAX ) 500 MG tablet Take 1 tablet (500 mg total) by mouth daily for 5  days. 5 tablet Karra Pink, [place], FNP   Dextromethorphan-guaiFENesin  (MUCINEX  DM MAXIMUM STRENGTH) 60-1200 MG TB12 Take 1 tablet by mouth 2 (two) times daily. 20 tablet Iola Lukes, FNP   cetirizine -pseudoephedrine  (ZYRTEC -D) 5-120 MG tablet Take 1 tablet by mouth daily with breakfast for 10 days. 10 tablet Iola Lukes, FNP   promethazine -dextromethorphan (PROMETHAZINE -DM) 6.25-15 MG/5ML syrup Take 10 mLs by mouth every 6 (six) hours as needed for cough. 118 mL Iola Lukes, FNP   methylPREDNISolone  (MEDROL  DOSEPAK) 4 MG TBPK tablet Take as directed 21 tablet Iola Lukes, FNP      PDMP not reviewed this encounter.   Iola Lukes, [place] 03/01/24 [phone]

## 2024-03-01 NOTE — Discharge Instructions (Addendum)
 Your symptoms are most consistent with acute bronchitis, likely viral but possibly with a bacterial component. You were prescribed azithromycin  and a Medrol  dose pack to reduce airway inflammation. You were also given Mucinex  DM to take twice daily to help with mucus, Promethazine  DM as needed for cough, and a prescription albuterol  inhaler to replace the over-the-counter version you've been using. Use the albuterol  inhaler as directed for wheezing or shortness of breath. Increase your fluid intake, use a humidifier at night, and elevate the head of your bed to reduce coughing while sleeping.  You also reported back pain, which is likely due to muscle strain from frequent coughing. This should improve as your cough resolves and with the use of the prescribed steroid.  Follow up with your primary care provider if your symptoms do not improve within a few days or begin to worsen.   Seek emergency care if you experience severe or worsening shortness of breath, chest pain, high fever, confusion, or any signs of respiratory distress such as difficulty speaking in full sentences or low oxygen levels.

## 2024-08-02 ENCOUNTER — Encounter (HOSPITAL_COMMUNITY): Payer: Self-pay | Admitting: Emergency Medicine

## 2024-08-02 ENCOUNTER — Ambulatory Visit (HOSPITAL_COMMUNITY)

## 2024-08-02 ENCOUNTER — Ambulatory Visit (HOSPITAL_COMMUNITY)
Admission: EM | Admit: 2024-08-02 | Discharge: 2024-08-02 | Disposition: A | Discharge status: Home / Self Care | Attending: Family Medicine | Admitting: Family Medicine

## 2024-08-02 DIAGNOSIS — R079 Chest pain, unspecified: Secondary | ICD-10-CM | POA: Diagnosis not present

## 2024-08-02 DIAGNOSIS — J4521 Mild intermittent asthma with (acute) exacerbation: Secondary | ICD-10-CM | POA: Diagnosis not present

## 2024-08-02 MED ORDER — IPRATROPIUM-ALBUTEROL 0.5-2.5 (3) MG/3ML IN SOLN
RESPIRATORY_TRACT | Status: AC
  Filled 2024-08-02: qty 3

## 2024-08-02 MED ORDER — KETOROLAC TROMETHAMINE 30 MG/ML IJ SOLN
INTRAMUSCULAR | Status: AC
  Filled 2024-08-02: qty 1

## 2024-08-02 MED ORDER — PREDNISONE 20 MG PO TABS: 40.0000 mg | ORAL_TABLET | Freq: Every day | ORAL | 0 refills | Status: AC

## 2024-08-02 MED ORDER — TRAMADOL HCL 50 MG PO TABS: 50.0000 mg | ORAL_TABLET | Freq: Four times a day (QID) | ORAL | 0 refills | Status: AC | PRN

## 2024-08-02 MED ORDER — KETOROLAC TROMETHAMINE 30 MG/ML IJ SOLN
30.0000 mg | Freq: Once | INTRAMUSCULAR | Status: AC
  Administered 2024-08-02: 30 mg via INTRAMUSCULAR

## 2024-08-02 MED ORDER — ALBUTEROL SULFATE HFA 108 (90 BASE) MCG/ACT IN AERS: 2.0000 | INHALATION_SPRAY | RESPIRATORY_TRACT | 0 refills | Status: AC | PRN

## 2024-08-02 MED ORDER — IPRATROPIUM-ALBUTEROL 0.5-2.5 (3) MG/3ML IN SOLN
3.0000 mL | Freq: Once | RESPIRATORY_TRACT | Status: AC
  Administered 2024-08-02: 3 mL via RESPIRATORY_TRACT

## 2024-08-02 NOTE — ED Triage Notes (Signed)
 Pt reports cough started on Saturday that is productive. Shortness of breath and pain in right rib and shoulder and neck started yesterday. Took Nyquil.

## 2024-08-02 NOTE — Discharge Instructions (Signed)
 The chest x-ray is clear.  You have been given a shot of Toradol  30 mg today.  Take tramadol  50 mg-- 1 tablet every 6 hours as needed for pain.  This medication can make you sleepy or dizzy  Albuterol  inhaler--do 2 puffs every 4 hours as needed for shortness of breath or wheezing  Take prednisone  20 mg--2 daily for 5 days  If you worsen in any way or if you do not get relief from the treatments provided, please consider going to the emergency room for further evaluation and treatment

## 2024-08-02 NOTE — ED Provider Notes (Signed)
 [facility]    CSN: [id number] Arrival date & time: 08/02/24  1722      History   Chief Complaint Chief Complaint  Patient presents with   Cough   Shortness of Breath    HPI Monica Sandoval is a 48 y.o. female.    Cough Associated symptoms: shortness of breath   Shortness of Breath Associated symptoms: cough    Here for trouble breathing that began yesterday and got worse today.  She has also had some nasal congestion and rhinorrhea.  Those symptoms began on December 6.  She has not had any fever except for maybe today.  She started having some right lower chest pain that is pleuritic and also is affected by movement yesterday.  She also has pain in her right side of her neck that is affected by movement of her head.  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   Qualifier: Diagnosis of  By: Manford Longs     TOBACCO DEPENDENCE 10/20/2006   Qualifier: Diagnosis of  By: Manford Longs      Patient Active Problem List   Diagnosis Date Noted   Acute leg pain, right 03/07/2023   Generalized anxiety disorder 08/25/2022   Headache 03/11/2022   Paranoia (HCC) 03/11/2022   Brain fog 03/10/2022   Healthcare maintenance 10/16/2021   History of DVT (deep vein thrombosis) 03/05/2020   Back pain without radiation 03/05/2020   Routine screening for STI (sexually transmitted infection) 03/05/2020   Ear pain, right 09/24/2019   Hyperlipidemia 10/04/2018   Primary osteoarthritis of left knee 03/22/2018   Dysuria 08/31/2014   History of tobacco use 10/20/2006    Past Surgical History:  Procedure Laterality Date   CHOLECYSTECTOMY  2008    OB  History   No obstetric history on file.      Home Medications    Prior to Admission medications  Medication Sig Start Date End Date Taking? Authorizing Provider  albuterol  (VENTOLIN  HFA) 108 (90 Base) MCG/ACT inhaler Inhale 2 puffs into the lungs every 4 (four) hours as needed for wheezing or shortness of breath. 08/02/24  Yes Vonna Sharlet POUR, MD  predniSONE  (DELTASONE ) 20 MG tablet Take 2 tablets (40 mg total) by mouth daily with breakfast for 5 days. 08/02/24 08/07/24 Yes Vonna Sharlet POUR, MD  traMADol  (ULTRAM ) 50 MG tablet Take 1 tablet (50 mg total) by mouth every 6 (six) hours as needed (pain). 08/02/24  Yes Laquetta Racey K, MD  esomeprazole (NEXIUM) 40 MG capsule Take 40 mg by mouth daily at 12 noon.    [provider]    Family History Family History  Problem Relation Age of Onset   Asthma Mother    Rheum arthritis Mother    Arthritis Mother    Diabetes Father    Heart disease Father    Early death Father    Diabetes Paternal Grandmother    Heart disease Paternal Grandmother    Cancer Paternal Grandmother     Social History Social History[1]   Allergies   Patient has no known allergies.   Review of Systems Review  of Systems  Respiratory:  Positive for cough and shortness of breath.      Physical Exam Triage Vital Signs ED Triage Vitals  Encounter Vitals Group     BP 08/02/24 1732 (!) 143/91     Girls Systolic BP Percentile --      Girls Diastolic BP Percentile --      Boys Systolic BP Percentile --      Boys Diastolic BP Percentile --      Pulse Rate 08/02/24 1732 (!) 113     Resp 08/02/24 1732 (!) 32     Temp 08/02/24 1732 98.8 F (37.1 C)     Temp src --      SpO2 08/02/24 1732 97 %     Weight --      Height --      Head Circumference --      Peak Flow --      Pain Score 08/02/24 1731 9     Pain Loc --      Pain Education --      Exclude from Growth Chart --    No data found.  Updated Vital Signs BP (!) 143/91 (BP  Location: Right Arm)   Pulse (!) 113   Temp 98.8 F (37.1 C)   Resp (!) 32   SpO2 97%   Visual Acuity Right Eye Distance:   Left Eye Distance:   Bilateral Distance:    Right Eye Near:   Left Eye Near:    Bilateral Near:     Physical Exam Vitals reviewed.  Constitutional:      General: She is in acute distress (She is in obvious discomfort and breathing rapidly on initial exam.  She states it is hard for her to take a deep breath and so she is breathing very rapidly due to the pain in her chest, by her report).     Appearance: She is not toxic-appearing or diaphoretic.  HENT:     Right Ear: Tympanic membrane and ear canal normal.     Left Ear: Tympanic membrane and ear canal normal.     Nose: Congestion present.     Mouth/Throat:     Mouth: Mucous membranes are moist.     Pharynx: No oropharyngeal exudate or posterior oropharyngeal erythema.  Eyes:     Extraocular Movements: Extraocular movements intact.     Conjunctiva/sclera: Conjunctivae normal.     Pupils: Pupils are equal, round, and reactive to light.  Neck:     Comments: She does have some tenderness of her right trap also. Cardiovascular:     Rate and Rhythm: Normal rate and regular rhythm.     Heart sounds: No murmur heard. Pulmonary:     Effort: No respiratory distress.     Breath sounds: No stridor. Wheezing present. No rhonchi or rales.  Chest:     Chest wall: Tenderness (right anterior axillary line) present.  Musculoskeletal:     Cervical back: Neck supple.  Lymphadenopathy:     Cervical: No cervical adenopathy.  Skin:    Capillary Refill: Capillary refill takes less than 2 seconds.     Coloration: Skin is not jaundiced or pale.  Neurological:     General: No focal deficit present.     Mental Status: She is alert and oriented to person, place, and time.  Psychiatric:        Behavior: Behavior normal.      UC Treatments / Results  Labs (all labs ordered are listed, but  only abnormal results are  displayed) Labs Reviewed - No data to display  EKG   Radiology DG Chest 2 View Result Date: 08/02/2024 EXAM: 2 VIEW(S) XRAY OF THE CHEST 08/02/2024 06:30:24 PM COMPARISON: 10/28/2023 CLINICAL HISTORY: right chest pain and wheezing x 2 days FINDINGS: LUNGS AND PLEURA: No focal pulmonary opacity. No pleural effusion. No pneumothorax. HEART AND MEDIASTINUM: No acute abnormality of the cardiac and mediastinal silhouettes. BONES AND SOFT TISSUES: No acute osseous abnormality. IMPRESSION: 1. No acute cardiopulmonary process. Electronically signed by: Pinkie Pebbles MD 08/02/2024 06:37 PM EST RP Workstation: [id number]    Procedures Procedures (including critical care time)  Medications Ordered in UC Medications  ipratropium-albuterol  (DUONEB) 0.5-2.5 (3) MG/3ML nebulizer solution 3 mL (3 mLs Nebulization Given 08/02/24 1739)  ketorolac  (TORADOL ) 30 MG/ML injection 30 mg (30 mg Intramuscular Given 08/02/24 1816)    Initial Impression / Assessment and Plan / UC Course  I have reviewed the triage vital signs and the nursing notes.  Pertinent labs & imaging results that were available during my care of the patient were reviewed by me and considered in my medical decision making (see chart for details).     Chest x-ray is clear. Her lung exam improved after the nebulizer treatment, but she was still wheezing.  She is also obviously splinting due to the right sided chest pain after the treatment still.   Toradol  injection is given here, but it has not provided her much relief so far.  Albuterol  inhaler is sent in for her along with some prednisone  for 5 days for asthma exacerbation  Tramadol  was sent in to try to relieve her pain.  There are no concerning issues or patterns on her PMP report  She will go to the emergency room if the treatments provided do not give her any relief or if she worsens in any way. Final Clinical Impressions(s) / UC Diagnoses   Final diagnoses:  Chest pain,  unspecified type  Mild intermittent asthma with exacerbation     Discharge Instructions      The chest x-ray is clear.  You have been given a shot of Toradol  30 mg today.  Take tramadol  50 mg-- 1 tablet every 6 hours as needed for pain.  This medication can make you sleepy or dizzy  Albuterol  inhaler--do 2 puffs every 4 hours as needed for shortness of breath or wheezing  Take prednisone  20 mg--2 daily for 5 days  If you worsen in any way or if you do not get relief from the treatments provided, please consider going to the emergency room for further evaluation and treatment     ED Prescriptions     Medication Sig Dispense Auth. Provider   albuterol  (VENTOLIN  HFA) 108 (90 Base) MCG/ACT inhaler Inhale 2 puffs into the lungs every 4 (four) hours as needed for wheezing or shortness of breath. 1 each Vonna Sharlet POUR, MD   predniSONE  (DELTASONE ) 20 MG tablet Take 2 tablets (40 mg total) by mouth daily with breakfast for 5 days. 10 tablet Vonna Sharlet POUR, MD   traMADol  (ULTRAM ) 50 MG tablet Take 1 tablet (50 mg total) by mouth every 6 (six) hours as needed (pain). 12 tablet Danel Studzinski K, MD      I have reviewed the PDMP during this encounter.      [1]  Social History Tobacco Use   Smoking status: Former    Current packs/day: 0.00    Average packs/day: 0.5 packs/day for 13.0 years (6.5 ttl pk-yrs)  Types: Cigarettes, E-cigarettes    Start date: 2005    Quit date: 09/09/2016    Years since quitting: 7.9   Smokeless tobacco: Never   Tobacco comments:    Sts she quit 09/12/16  Vaping Use   Vaping status: Every Day  Substance Use Topics   Alcohol use: No   Drug use: No     Vonna Sharlet POUR, MD 08/02/24 1858
